# Patient Record
Sex: Male | Born: 1945
Health system: Southern US, Community
[De-identification: ages and names within clinical notes are randomized; demographics above are authoritative.]

## PROBLEM LIST (undated history)

## (undated) DIAGNOSIS — E669 Obesity, unspecified: Secondary | ICD-10-CM

## (undated) DIAGNOSIS — I2510 Atherosclerotic heart disease of native coronary artery without angina pectoris: Secondary | ICD-10-CM

## (undated) DIAGNOSIS — K219 Gastro-esophageal reflux disease without esophagitis: Secondary | ICD-10-CM

## (undated) DIAGNOSIS — B354 Tinea corporis: Secondary | ICD-10-CM

## (undated) DIAGNOSIS — N200 Calculus of kidney: Secondary | ICD-10-CM

## (undated) DIAGNOSIS — Z9289 Personal history of other medical treatment: Secondary | ICD-10-CM

## (undated) DIAGNOSIS — M1990 Unspecified osteoarthritis, unspecified site: Secondary | ICD-10-CM

## (undated) DIAGNOSIS — I10 Essential (primary) hypertension: Secondary | ICD-10-CM

## (undated) HISTORY — DX: Personal history of other medical treatment: Z92.89

## (undated) HISTORY — PX: CATARACT EXTRACTION: SUR2

## (undated) HISTORY — PX: TOTAL KNEE ARTHROPLASTY: SHX125

## (undated) HISTORY — DX: Tinea corporis: B35.4

## (undated) HISTORY — DX: Obesity, unspecified: E66.9

## (undated) HISTORY — PX: TONSILLECTOMY: SUR1361

---

## 2008-01-12 ENCOUNTER — Encounter: Payer: Self-pay | Admitting: Internal Medicine

## 2009-06-28 ENCOUNTER — Encounter (INDEPENDENT_AMBULATORY_CARE_PROVIDER_SITE_OTHER): Payer: Self-pay | Admitting: *Deleted

## 2009-07-05 ENCOUNTER — Ambulatory Visit: Payer: Self-pay | Admitting: Internal Medicine

## 2009-07-05 DIAGNOSIS — N400 Enlarged prostate without lower urinary tract symptoms: Secondary | ICD-10-CM

## 2009-07-05 DIAGNOSIS — G4700 Insomnia, unspecified: Secondary | ICD-10-CM

## 2009-07-05 LAB — CONVERTED CEMR LAB
Bacteria, UA: NONE SEEN
Bilirubin Urine: NEGATIVE
Blood in Urine, dipstick: NEGATIVE
Casts: NONE SEEN /lpf
Crystals: NONE SEEN
Ketones, urine, test strip: NEGATIVE
Protein, U semiquant: NEGATIVE
Specific Gravity, Urine: <1.005
Urobilinogen, UA: 0.2
WBC, UA: NONE SEEN cells/hpf (ref <3)

## 2009-07-06 ENCOUNTER — Encounter: Payer: Self-pay | Admitting: Internal Medicine

## 2009-07-10 LAB — CONVERTED CEMR LAB
BUN: 11 mg/dL (ref 6–23)
Basophils Absolute: 0.1 10*3/uL (ref 0.0–0.1)
Cholesterol: 198 mg/dL (ref 0–200)
Creatinine, Ser: 0.8 mg/dL (ref 0.4–1.5)
GFR calc non Af Amer: 103.27 mL/min (ref >60)
Glucose, Bld: 83 mg/dL (ref 70–99)
HCT: 46.3 % (ref 39.0–52.0)
Lymphs Abs: 2.3 10*3/uL (ref 0.7–4.0)
MCV: 96.2 fL (ref 78.0–100.0)
Monocytes Absolute: 0.7 10*3/uL (ref 0.1–1.0)
Monocytes Relative: 8.8 % (ref 3.0–12.0)
Platelets: 235 10*3/uL (ref 150.0–400.0)
Potassium: 3.9 meq/L (ref 3.5–5.1)
RDW: 15.2 % — ABNORMAL HIGH (ref 11.5–14.6)
TSH: 1.12 microintl units/mL (ref 0.35–5.50)

## 2009-09-22 ENCOUNTER — Ambulatory Visit: Payer: Self-pay | Admitting: Internal Medicine

## 2009-09-22 DIAGNOSIS — M25519 Pain in unspecified shoulder: Secondary | ICD-10-CM | POA: Insufficient documentation

## 2009-09-22 DIAGNOSIS — L719 Rosacea, unspecified: Secondary | ICD-10-CM

## 2009-10-10 ENCOUNTER — Telehealth: Payer: Self-pay | Admitting: Internal Medicine

## 2009-10-10 ENCOUNTER — Encounter: Payer: Self-pay | Admitting: Internal Medicine

## 2009-10-13 ENCOUNTER — Encounter: Payer: Self-pay | Admitting: Internal Medicine

## 2010-04-24 NOTE — Assessment & Plan Note (Signed)
Summary: irregular spot on nose/cbs   Vital Signs:  Patient profile:   65 year old male Weight:      235.13 pounds Pulse rate:   82 / minute Pulse rhythm:   regular BP sitting:   130 / 78  (left arm) Cuff size:   large  Vitals Entered By: Army Fossa CMA (September 22, 2009 1:08 PM) CC: Pt here c/o irregular skin on his nose x 2-3 months, Shoulder pain x 6 months.    History of Present Illness: for 2 months, his nose looks different, has a rash  For 6 months, he's having right shoulder pain, worse when he tries to exercise or go to the gym No radiation although he has noted some pain at the upper thoracic spine. Symptoms started after he moved to another  house, he did a lot of lifting symptoms often times are worse at night  Review of systems No discharge from the rash at  the nose No runny nose with food intake No increase size of his nose lately no right upper extremity paresthesias   Allergies (verified): No Known Drug Allergies  Past History:  Past Medical History: Reviewed history from 07/05/2009 and no changes required. Hx of ETOH abuse ( abstinent since 2008 apros , after TKR . apparently drinking d/t pain) h/o obesity, better since TKR h/o  HTN, resolved after he d/c ETOH OA , s/p knee replacement  mild allergies  UTI as a achild  Cscope  x 1 , had Polyps (aprox 2008) h/o Kidney stones  Past Surgical History: Reviewed history from 07/05/2009 and no changes required. Tonsillectomy-1950 Total knee replacement ***B*** 2008  Social History: Reviewed history from 07/05/2009 and no changes required. Married tobacco--no  Recovered alcoholic, abstinent since aprox 2008 Drug use-no Regular exercise-yes used to live in [place] years  moved from [place] 1-11 diet-- healthy exercise-- very active   Review of Systems      See HPI  Physical Exam  General:  alert and well-developed.   Msk:  shoulders are symmetric no tender to palpation at  the a.c. joint on either side Range of motion is slightly limited but symmetric as well. With extremes range of motion, he has pain in the right shoulder.  Neurologic:  upper extremity motor exam normal Skin:  nose skin  is rough, slightly oily, some atelectasis . I see no particular mole   Impression & Recommendations:  Problem # 1:  ROSACEA (ICD-695.3)  suspect he has rosacea Patient  is convinced he has  BCC and likes a referral to a specialist-----> will arrenge     Orders: Dermatology Referral (Derma)  Problem # 2:  SHOULDER PAIN (ICD-719.41)  suspect a rotator cuff tear Declines to take   medicines ( he has some Norco leftover from his knee surgery but doesn't like to " keep taking" medicine) declines physical therapy  plan: ortho referal  Orders: Orthopedic Referral (Ortho)

## 2010-04-24 NOTE — Letter (Signed)
Summary: New Patient Letter  [facility]  [address] [place], [place] [address]   Phone: [phone]  Fax: [phone]       06/28/2009 MRN: [id number]  Erik [name] [address] [place], [place]  [address]  Dear Mr. Erik [name] Angelica,   Welcome to [facility] and thank you for choosing [place] as your Primary Care Providers. Enclosed you will find information about our practice that we hope you find helpful. We have also enclosed forms to be filled out prior to your visit. This will provide [place] with the necessary information and facilitate your being seen in a timely manner. If you have any questions, please call us at:  [phone]        and we will be happy to assist you. We look forward to seeing you at your scheduled appointment time.  Appointment   [place] Antu 37,[phone]            with Dr.    Drue Novel             Sincerely,  Primary Health Care Team  Please arrive 15 minutes early for your first appointment and bring your insurance card. Co-pay is required at the time of your visit.  *****Please call the office if you are not able to keep this appointment. There is a charge of $50.00 if any appointment is not cancelled or rescheduled within 24 hours.

## 2010-04-24 NOTE — Assessment & Plan Note (Signed)
Summary: NEW PT/CPX/NS/KDC   Vital Signs:  Patient profile:   65 year old male Height:      70.5 inches Weight:      242 pounds BMI:     34.36 Pulse rate:   60 / minute BP sitting:   142 / 80  Vitals Entered By: R.Peeler CC: new pt./est./fasting Comments discuss urinary incontinence +rash on lower legs +pain in rt. shoulder-old injury   History of Present Illness: CPX NEW PT   one-year history of a rash, on and off, may last a few weeks, usually from the knee down, patches of slightly great and itchy skin.  Not dry  also urinary frequency and urgency for a while.  He recognizes that he drinks a lot of water. 3 weeks ago however, he developed suprapubic discomfort that radiated to the penis and along with that he had one or two episodes of very dark urine.  No frank blood that he could tell. Same symptoms repeated two weeks ago. no risk factors for  STDs he does have a history of urolithiasis at the time of his symptoms he did not have fever, nausea, vomiting, back pain. Is not experiencing difficulty urinating or slow flow  Preventive Screening-Counseling & Management  Caffeine-Diet-Exercise     Caffeine use/day: daily     Does Patient Exercise: yes     Times/week: 3      Drug Use:  no.    Past History:  Past Medical History: Hx of ETOH abuse ( abstinent since 2008 apros , after TKR . apparently drinking d/t pain) h/o obesity, better since TKR h/o  HTN, resolved after he d/c ETOH OA , s/p knee replacement  mild allergies  UTI as a achild  Cscope  x 1 , had Polyps (aprox 2008) h/o Kidney stones  Past Surgical History: Tonsillectomy-1950 Total knee replacement ***B*** 2008   Family History: +  Alcoholism/Addiction-Father + Arthritis Hypertension-- M pacemaker-- M DM--no MI--no prostate cancer-- dx age 52 colon ca--no   Social History: Married tobacco--no  Recovered alcoholic, abstinent since aprox 2008 Drug use-no Regular exercise-yes used to  live in [place] x 10 years  moved from [place] 1-11 diet-- healthy exercise-- very active Drug Use:  no Does Patient Exercise:  yes Caffeine use/day:  daily  Review of Systems CV:  Denies chest pain or discomfort and swelling of feet. Resp:  Denies cough and shortness of breath. GI:  Denies bloody stools, nausea, and vomiting. Psych:  Denies anxiety and depression.  Physical Exam  General:  alert, well-developed, and overweight-appearing.   Neck:  no masses, no thyromegaly, and normal carotid upstroke.   Lungs:  normal respiratory effort, no intercostal retractions, no accessory muscle use, and normal breath sounds.   Heart:  normal rate, regular rhythm, no murmur, and no gallop.   Abdomen:  soft, non-tender, no distention, no masses, no guarding, and no rigidity.   Rectal:  No external abnormalities noted. Normal sphincter tone. No rectal masses or tenderness. Prostate:  Prostate gland firm and smooth, no enlargement, nodularity, tenderness, mass, asymmetry or induration. Extremities:  no edema Neurologic:  speech, gait, motor strength symmetric and normal Psych:  Oriented X3, memory intact for recent and remote, normally interactive, good eye contact, not anxious appearing, and not depressed appearing.     Impression & Recommendations:  Problem # 1:  ROUTINE GENERAL MEDICAL EXAM@HEALTH  CARE FACL (ICD-V70.0) recently moved from [place], doing well except for the rash and BPH symptoms he reportedly had an unhealthy  lifestyle, drunk alcohol, was on medication for BP up until 2008.  He got a lot better after his TKRs and he was able to be more active.  Currently feeling very well reports a Cscope  x 1 , had Polyps (aprox 2008) , again will obtain records labs, likes vit D checked also continue healthy lifestyle  Orders: Venipuncture (62130) TLB-ALT (SGPT) (84460-ALT) TLB-AST (SGOT) (84450-SGOT) TLB-BMP (Basic Metabolic Panel-BMET) (80048-METABOL) TLB-CBC Platelet -  w/Differential (85025-CBCD) TLB-Lipid Panel (80061-LIPID) TLB-PSA (Prostate Specific Antigen) (84153-PSA) TLB-TSH (Thyroid Stimulating Hormone) (84443-TSH) UA Dipstick w/o Micro (manual) (86578) Specimen Handling (99000) T-Culture, Urine (46962-95284) T-Urine Microscopic (13244-01027) T-Vitamin D (25-Hydroxy) (25366-44034)  Problem # 2:  HYPERTROPHY PROSTATE W/O UR OBST & OTH LUTS (ICD-600.00) symptoms consistent with BPH however prostate  exam is normal episodic dark urine, hematuria? Plan: UA, urine culture PSA Will recommend the patient to come back to the office for a urine sample whenever he is urine turns dark consider urology referral trial of Flomax for frequency/urgency symptoms  Problem # 3:  rash exam: patch of slt dry-red skin in the LE recommend over-the-counter hydrocortisone cream. Eczema?  Problem # 4:  INSOMNIA-SLEEP DISORDER-UNSPEC (ICD-780.52) on klonopin as needed apparently had also issues w/  "moving his legs" at night will call for RF, will need 90 days   Complete Medication List: 1)  Klonopin 0.5 Mg Tabs (Clonazepam) .... One tablet by mouth daily 2)  Mvi  .... Daily 3)  Flomax 0.4 Mg Caps (Tamsulosin hcl) .... One by mouth daily  Patient Instructions: 1)  hydrocortisone cream OTC to legs as needed  2)  Please schedule a follow-up appointment in 6 months .  Prescriptions: FLOMAX 0.4 MG CAPS (TAMSULOSIN HCL) one by mouth daily  #30 x 6   Entered and Authorized by:   Elita Quick E. Paz MD   Signed by:   Nolon Rod. Paz MD on 07/05/2009   Method used:   Print then Give to Patient   RxID:   [id number]    Preventive Care Screening  Colonoscopy:    Date:  03/25/2006    Results:  normal    Laboratory Results   Urine Tests    Routine Urinalysis   Glucose: negative   (Normal Range: Negative) Bilirubin: negative   (Normal Range: Negative) Ketone: negative   (Normal Range: Negative) Spec. Gravity: <1.005   (Normal Range: 1.003-1.035) Blood:  negative   (Normal Range: Negative) pH: 6.0   (Normal Range: 5.0-8.0) Protein: negative   (Normal Range: Negative) Urobilinogen: 0.2   (Normal Range: 0-1) Nitrite: negative   (Normal Range: Negative) Leukocyte Esterace: negative   (Normal Range: Negative)

## 2010-04-24 NOTE — Consult Note (Signed)
Summary: [facility]  [facility]   Imported By: Lanelle Bal 11/01/2009 09:36:06  _____________________________________________________________________  External Attachment:    Type:   Image     Comment:   External Document

## 2010-04-24 NOTE — Progress Notes (Signed)
Summary: old paper records   Phone Note Outgoing Call   Summary of Call: old paper records reviewed -We'll scan his old EKGs -We'll also scan an abdominal ultrasound from 01-12-08: fatty liver, right kidney cyst, bilateral renal stones -the papers will be mailed to the patient for safe keeping Yee Gangi E. Kaenan Jake MD  October 10, 2009 9:29 AM

## 2010-12-09 ENCOUNTER — Emergency Department (HOSPITAL_COMMUNITY)
Admission: EM | Admit: 2010-12-09 | Discharge: 2010-12-09 | Disposition: A | Discharge status: Home / Self Care | Payer: Medicare Other | Attending: Emergency Medicine | Admitting: Emergency Medicine

## 2010-12-09 ENCOUNTER — Emergency Department (HOSPITAL_COMMUNITY): Payer: Medicare Other

## 2010-12-09 DIAGNOSIS — R51 Headache: Secondary | ICD-10-CM | POA: Insufficient documentation

## 2010-12-09 DIAGNOSIS — R42 Dizziness and giddiness: Secondary | ICD-10-CM | POA: Insufficient documentation

## 2010-12-09 DIAGNOSIS — R0602 Shortness of breath: Secondary | ICD-10-CM | POA: Insufficient documentation

## 2010-12-09 DIAGNOSIS — R000 Tachycardia, unspecified: Secondary | ICD-10-CM | POA: Insufficient documentation

## 2010-12-09 DIAGNOSIS — R079 Chest pain, unspecified: Secondary | ICD-10-CM | POA: Insufficient documentation

## 2010-12-09 DIAGNOSIS — I10 Essential (primary) hypertension: Secondary | ICD-10-CM | POA: Insufficient documentation

## 2010-12-09 LAB — DIFFERENTIAL
Basophils Absolute: 0.1 10*3/uL (ref 0.0–0.1)
Basophils Relative: 1 % (ref 0–1)
Neutro Abs: 7.7 10*3/uL (ref 1.7–7.7)
Neutrophils Relative %: 65 % (ref 43–77)

## 2010-12-09 LAB — CBC
MCV: 95 fL (ref 78.0–100.0)
Platelets: 239 10*3/uL (ref 150–400)
RBC: 4.64 MIL/uL (ref 4.22–5.81)
RDW: 13.6 % (ref 11.5–15.5)
WBC: 11.9 10*3/uL — ABNORMAL HIGH (ref 4.0–10.5)

## 2010-12-09 LAB — PRO B NATRIURETIC PEPTIDE: Pro B Natriuretic peptide (BNP): 145.2 pg/mL — ABNORMAL HIGH (ref 0–125)

## 2010-12-09 LAB — POCT I-STAT TROPONIN I: Troponin i, poc: 0.03 ng/mL (ref 0.00–0.08)

## 2010-12-09 LAB — BASIC METABOLIC PANEL
Chloride: 101 mEq/L (ref 96–112)
GFR calc Af Amer: >60 mL/min (ref >60)
GFR calc non Af Amer: >60 mL/min (ref >60)
Potassium: 4.2 mEq/L (ref 3.5–5.1)
Sodium: 138 mEq/L (ref 135–145)

## 2011-04-03 DIAGNOSIS — J111 Influenza due to unidentified influenza virus with other respiratory manifestations: Secondary | ICD-10-CM | POA: Diagnosis not present

## 2011-04-14 ENCOUNTER — Emergency Department (HOSPITAL_COMMUNITY)
Admission: EM | Admit: 2011-04-14 | Discharge: 2011-04-14 | Disposition: A | Discharge status: Home / Self Care | Payer: Medicare Other | Attending: Emergency Medicine | Admitting: Emergency Medicine

## 2011-04-14 ENCOUNTER — Emergency Department (HOSPITAL_COMMUNITY): Payer: Medicare Other

## 2011-04-14 ENCOUNTER — Encounter (HOSPITAL_COMMUNITY): Payer: Self-pay | Admitting: Emergency Medicine

## 2011-04-14 DIAGNOSIS — R109 Unspecified abdominal pain: Secondary | ICD-10-CM | POA: Insufficient documentation

## 2011-04-14 DIAGNOSIS — N201 Calculus of ureter: Secondary | ICD-10-CM | POA: Insufficient documentation

## 2011-04-14 DIAGNOSIS — N200 Calculus of kidney: Secondary | ICD-10-CM | POA: Diagnosis not present

## 2011-04-14 DIAGNOSIS — I10 Essential (primary) hypertension: Secondary | ICD-10-CM | POA: Diagnosis not present

## 2011-04-14 DIAGNOSIS — Z79899 Other long term (current) drug therapy: Secondary | ICD-10-CM | POA: Insufficient documentation

## 2011-04-14 HISTORY — DX: Calculus of kidney: N20.0

## 2011-04-14 HISTORY — DX: Essential (primary) hypertension: I10

## 2011-04-14 LAB — URINALYSIS, ROUTINE W REFLEX MICROSCOPIC
Bilirubin Urine: NEGATIVE
Nitrite: NEGATIVE
Specific Gravity, Urine: 1.027 (ref 1.005–1.030)
Urobilinogen, UA: 0.2 mg/dL (ref 0.0–1.0)
pH: 5.5 (ref 5.0–8.0)

## 2011-04-14 LAB — BASIC METABOLIC PANEL
BUN: 11 mg/dL (ref 6–23)
Chloride: 105 mEq/L (ref 96–112)
GFR calc Af Amer: 89 mL/min — ABNORMAL LOW (ref >90)
GFR calc non Af Amer: 76 mL/min — ABNORMAL LOW (ref >90)
Potassium: 3.8 mEq/L (ref 3.5–5.1)
Sodium: 140 mEq/L (ref 135–145)

## 2011-04-14 LAB — URINE MICROSCOPIC-ADD ON

## 2011-04-14 MED ORDER — ONDANSETRON HCL 4 MG/2ML IJ SOLN
4.0000 mg | Freq: Once | INTRAMUSCULAR | Status: AC
  Administered 2011-04-14: 4 mg via INTRAVENOUS
  Filled 2011-04-14: qty 2

## 2011-04-14 MED ORDER — MORPHINE SULFATE 4 MG/ML IJ SOLN
6.0000 mg | Freq: Once | INTRAMUSCULAR | Status: AC
  Administered 2011-04-14: 6 mg via INTRAVENOUS
  Filled 2011-04-14: qty 2

## 2011-04-14 MED ORDER — KETOROLAC TROMETHAMINE 30 MG/ML IJ SOLN
30.0000 mg | Freq: Once | INTRAMUSCULAR | Status: AC
  Administered 2011-04-14: 30 mg via INTRAVENOUS
  Filled 2011-04-14: qty 1

## 2011-04-14 MED ORDER — TAMSULOSIN HCL 0.4 MG PO CAPS: 0.4000 mg | ORAL_CAPSULE | Freq: Every day | ORAL | Status: DC

## 2011-04-14 MED ORDER — OXYCODONE-ACETAMINOPHEN 5-325 MG PO TABS: 1.0000 | ORAL_TABLET | Freq: Four times a day (QID) | ORAL | Status: AC | PRN

## 2011-04-14 MED ORDER — OXYCODONE-ACETAMINOPHEN 5-325 MG PO TABS
2.0000 | ORAL_TABLET | Freq: Once | ORAL | Status: AC
  Administered 2011-04-14: 2 via ORAL
  Filled 2011-04-14: qty 2

## 2011-04-14 MED ORDER — HYDROMORPHONE HCL PF 1 MG/ML IJ SOLN
1.0000 mg | Freq: Once | INTRAMUSCULAR | Status: AC
  Administered 2011-04-14: 1 mg via INTRAVENOUS
  Filled 2011-04-14: qty 1

## 2011-04-14 MED ORDER — NAPROXEN 500 MG PO TABS: 500.0000 mg | ORAL_TABLET | Freq: Two times a day (BID) | ORAL | Status: AC

## 2011-04-14 NOTE — ED Provider Notes (Signed)
History     CSN: [id number]  Arrival date & time 04/14/11  1324   First MD Initiated Contact with Patient 04/14/11 1403      Chief Complaint  Patient presents with  . Abdominal Pain    (Consider location/radiation/quality/duration/timing/severity/associated sxs/prior treatment) HPI Comments: Patient presents with sudden onset of pain to his right flank area that started 2 hours ago. He has a history of one kidney stone in the past approximately 10 years ago. This feels similar to that he did try to take a Norco tablet at home prior to arrival but this did not relieve his symptoms. He has some pressure on urination but denies any other urinary difficulties. He's had some nausea but no vomiting no fevers no change in bowel habits. Denies any fever cough congestion or other recent illnesses.  Patient is a 66 y.o. male presenting with abdominal pain. The history is provided by the patient.  Abdominal Pain The primary symptoms of the illness include abdominal pain and nausea. The primary symptoms of the illness do not include fever, fatigue, shortness of breath, vomiting or diarrhea.  Additional symptoms associated with the illness include urgency. Symptoms associated with the illness do not include chills, diaphoresis, hematuria, frequency or back pain.    Past Medical History  Diagnosis Date  . Kidney stone   . Hypertension     Past Surgical History  Procedure Date  . Knee arthroplasty     History reviewed. No pertinent family history.  History  Substance Use Topics  . Smoking status: Not on file  . Smokeless tobacco: Not on file  . Alcohol Use:       Review of Systems  Constitutional: Negative for fever, chills, diaphoresis and fatigue.  HENT: Negative for congestion, rhinorrhea and sneezing.   Eyes: Negative.   Respiratory: Negative for cough, chest tightness and shortness of breath.   Cardiovascular: Negative for chest pain and leg swelling.  Gastrointestinal:  Positive for nausea and abdominal pain. Negative for vomiting, diarrhea and blood in stool.  Genitourinary: Positive for urgency. Negative for frequency, hematuria, flank pain, difficulty urinating and testicular pain.  Musculoskeletal: Negative for back pain and arthralgias.  Skin: Negative for rash.  Neurological: Negative for dizziness, speech difficulty, weakness, numbness and headaches.    Allergies  Review of patient's allergies indicates no known allergies.  Home Medications   Current Outpatient Rx  Name Route Sig Dispense Refill  . BENZONATATE 200 MG PO CAPS Oral Take 200 mg by mouth 3 (three) times daily as needed. Cough    . CALCIUM CARBONATE-VITAMIN D 500-200 MG-UNIT PO TABS Oral Take 1 tablet by mouth daily.    Marland Kitchen HYDROCODONE-ACETAMINOPHEN 10-325 MG PO TABS Oral Take 1 tablet by mouth every 6 (six) hours as needed. Pain    . ADULT MULTIVITAMIN W/MINERALS CH Oral Take 1 tablet by mouth daily.    . OSELTAMIVIR PHOSPHATE 75 MG PO CAPS Oral Take 75 mg by mouth daily. Completed last sunday    . RAMIPRIL 10 MG PO TABS Oral Take 10 mg by mouth daily.    . SAW PALMETTO (SERENOA REPENS) 500 MG PO CAPS Oral Take 500 mg by mouth daily.      BP 143/87  Pulse 83  Temp(Src) 97.3 F (36.3 C) (Oral)  Resp 16  SpO2 100%  Physical Exam  Constitutional: He is oriented to person, place, and time. He appears well-developed and well-nourished.  HENT:  Head: Normocephalic and atraumatic.  Eyes: Pupils are equal, round, and  reactive to light.  Neck: Normal range of motion. Neck supple.  Cardiovascular: Normal rate, regular rhythm and normal heart sounds.   Pulmonary/Chest: Effort normal and breath sounds normal. No respiratory distress. He has no wheezes. He has no rales. He exhibits no tenderness.  Abdominal: Soft. Bowel sounds are normal. There is tenderness. There is no rebound and no guarding.       Mild tenderness in the right mid abdomen. No rebound or guarding.  No CVA tenderness    Musculoskeletal: Normal range of motion. He exhibits no edema.  Lymphadenopathy:    He has no cervical adenopathy.  Neurological: He is alert and oriented to person, place, and time.  Skin: Skin is warm and dry. No rash noted.  Psychiatric: He has a normal mood and affect.    ED Course  Procedures (including critical care time)  Results for orders placed during the hospital encounter of 04/14/11  URINALYSIS, ROUTINE W REFLEX MICROSCOPIC      Component Value Range   Color, Urine AMBER (*) YELLOW    APPearance CLOUDY (*) CLEAR    Specific Gravity, Urine 1.027  1.005 - 1.030    pH 5.5  5.0 - 8.0    Glucose, UA NEGATIVE  NEGATIVE (mg/dL)   Hgb urine dipstick LARGE (*) NEGATIVE    Bilirubin Urine NEGATIVE  NEGATIVE    Ketones, ur TRACE (*) NEGATIVE (mg/dL)   Protein, ur NEGATIVE  NEGATIVE (mg/dL)   Urobilinogen, UA 0.2  0.0 - 1.0 (mg/dL)   Nitrite NEGATIVE  NEGATIVE    Leukocytes, UA SMALL (*) NEGATIVE   BASIC METABOLIC PANEL      Component Value Range   Sodium 140  135 - 145 (mEq/L)   Potassium 3.8  3.5 - 5.1 (mEq/L)   Chloride 105  96 - 112 (mEq/L)   CO2 25  19 - 32 (mEq/L)   Glucose, Bld 105 (*) 70 - 99 (mg/dL)   BUN 11  6 - 23 (mg/dL)   Creatinine, Ser 1.61  0.50 - 1.35 (mg/dL)   Calcium 9.4  8.4 - 09.6 (mg/dL)   GFR calc non Af Amer 76 (*) >90 (mL/min)   GFR calc Af Amer 89 (*) >90 (mL/min)  URINE MICROSCOPIC-ADD ON      Component Value Range   Squamous Epithelial / LPF RARE  RARE    WBC, UA 3-6  <3 (WBC/hpf)   RBC / HPF TOO NUMEROUS TO COUNT  <3 (RBC/hpf)   Bacteria, UA MANY (*) RARE    Urine-Other MUCOUS PRESENT     Ct Abdomen Pelvis Wo Contrast  04/14/2011  *RADIOLOGY REPORT*  Clinical Data: Right abdominal pain, history of kidney stone.  CT ABDOMEN AND PELVIS WITHOUT CONTRAST  Technique:  Multidetector CT imaging of the abdomen and pelvis was performed following the standard protocol without intravenous contrast.  Comparison: None.  Findings: Mild linear scarring  in the lingula and bilateral lower lobes.  Moderate atelectasis.  Unenhanced liver, spleen, pancreas, and adrenal glands are within normal limits.  Gallbladder is unremarkable.  No intrahepatic or extrahepatic ductal dilatation.  3.3 x 5.3 cm right renal cyst.  At least four left and three right nonobstructing renal calculi, measuring up to 3 mm. No hydronephrosis.  No evidence of bowel obstruction.  Normal appendix.  Colonic diverticulosis, without associated inflammatory changes.  Atherosclerotic calcifications of the abdominal aorta and branch vessels.  No abdominopelvic ascites.  No suspicious abdominopelvic lymphadenopathy.  Prostate is unremarkable.  4 mm distal right ureteral calculus (  series 2/image 85).  Bladder is underdistended.  Mild degenerative changes of the visualized thoracolumbar spine.  IMPRESSION: 4 mm distal right ureteral calculus.  No hydronephrosis.  Multiple additional bilateral nonobstructing calculi measuring up to 3 mm.  Original Report Authenticated By: Charline Bills, M.D.      1. Kidney stone       MDM  Pt with 4mm distal ureteral stone.  No hydro.  No infection.  If pain controlled, can go home and f/u with urology        Rolan Bucco, MD 04/14/11 [phone]

## 2011-04-14 NOTE — ED Notes (Signed)
Pt family member sts that she would like to talk to EDP. Pt c/o nausea, dry heaves, and pain.

## 2011-04-14 NOTE — ED Provider Notes (Signed)
Improved on reassessment after the dilaudid, Toradol. I administered a dose of Percocet to last him until he gets his prescription filled. Instructed to followup with urology within one week  Dayton Bailiff, MD 04/14/11 1656

## 2011-04-14 NOTE — ED Notes (Signed)
abd pain for the past 2 hours radates to back, took hydrocodone earlier with minimal relief, hx kidney stone

## 2011-04-26 DIAGNOSIS — E669 Obesity, unspecified: Secondary | ICD-10-CM | POA: Diagnosis not present

## 2011-04-26 DIAGNOSIS — I10 Essential (primary) hypertension: Secondary | ICD-10-CM | POA: Diagnosis not present

## 2011-04-26 DIAGNOSIS — R0982 Postnasal drip: Secondary | ICD-10-CM | POA: Diagnosis not present

## 2011-05-15 DIAGNOSIS — I10 Essential (primary) hypertension: Secondary | ICD-10-CM | POA: Diagnosis not present

## 2011-05-15 DIAGNOSIS — Z125 Encounter for screening for malignant neoplasm of prostate: Secondary | ICD-10-CM | POA: Diagnosis not present

## 2011-10-24 DIAGNOSIS — E669 Obesity, unspecified: Secondary | ICD-10-CM | POA: Diagnosis not present

## 2011-10-24 DIAGNOSIS — I10 Essential (primary) hypertension: Secondary | ICD-10-CM | POA: Diagnosis not present

## 2011-10-24 DIAGNOSIS — M25519 Pain in unspecified shoulder: Secondary | ICD-10-CM | POA: Diagnosis not present

## 2012-04-01 DIAGNOSIS — E669 Obesity, unspecified: Secondary | ICD-10-CM | POA: Diagnosis not present

## 2012-04-01 DIAGNOSIS — I10 Essential (primary) hypertension: Secondary | ICD-10-CM | POA: Diagnosis not present

## 2012-04-01 DIAGNOSIS — Z0000 Encounter for general adult medical examination without abnormal findings: Secondary | ICD-10-CM | POA: Diagnosis not present

## 2012-04-27 DIAGNOSIS — D1801 Hemangioma of skin and subcutaneous tissue: Secondary | ICD-10-CM | POA: Diagnosis not present

## 2012-04-27 DIAGNOSIS — L739 Follicular disorder, unspecified: Secondary | ICD-10-CM | POA: Diagnosis not present

## 2012-04-27 DIAGNOSIS — L570 Actinic keratosis: Secondary | ICD-10-CM | POA: Diagnosis not present

## 2012-04-27 DIAGNOSIS — L821 Other seborrheic keratosis: Secondary | ICD-10-CM | POA: Diagnosis not present

## 2012-04-27 DIAGNOSIS — R21 Rash and other nonspecific skin eruption: Secondary | ICD-10-CM | POA: Diagnosis not present

## 2012-04-27 DIAGNOSIS — D485 Neoplasm of uncertain behavior of skin: Secondary | ICD-10-CM | POA: Diagnosis not present

## 2012-04-27 DIAGNOSIS — L259 Unspecified contact dermatitis, unspecified cause: Secondary | ICD-10-CM | POA: Diagnosis not present

## 2012-04-27 DIAGNOSIS — L820 Inflamed seborrheic keratosis: Secondary | ICD-10-CM | POA: Diagnosis not present

## 2012-11-30 DIAGNOSIS — R195 Other fecal abnormalities: Secondary | ICD-10-CM | POA: Diagnosis not present

## 2012-11-30 DIAGNOSIS — R0609 Other forms of dyspnea: Secondary | ICD-10-CM | POA: Diagnosis not present

## 2012-11-30 DIAGNOSIS — R42 Dizziness and giddiness: Secondary | ICD-10-CM | POA: Diagnosis not present

## 2012-11-30 DIAGNOSIS — R5381 Other malaise: Secondary | ICD-10-CM | POA: Diagnosis not present

## 2012-12-14 DIAGNOSIS — I10 Essential (primary) hypertension: Secondary | ICD-10-CM | POA: Diagnosis not present

## 2012-12-14 DIAGNOSIS — R0609 Other forms of dyspnea: Secondary | ICD-10-CM | POA: Diagnosis not present

## 2012-12-14 DIAGNOSIS — E669 Obesity, unspecified: Secondary | ICD-10-CM | POA: Diagnosis not present

## 2012-12-14 DIAGNOSIS — Z13220 Encounter for screening for lipoid disorders: Secondary | ICD-10-CM | POA: Diagnosis not present

## 2012-12-31 ENCOUNTER — Ambulatory Visit (HOSPITAL_COMMUNITY): Payer: Medicare Other | Attending: Cardiovascular Disease | Admitting: Radiology

## 2012-12-31 ENCOUNTER — Other Ambulatory Visit (HOSPITAL_COMMUNITY): Payer: Self-pay | Admitting: Interventional Cardiology

## 2012-12-31 DIAGNOSIS — R0609 Other forms of dyspnea: Secondary | ICD-10-CM | POA: Insufficient documentation

## 2012-12-31 DIAGNOSIS — R0602 Shortness of breath: Secondary | ICD-10-CM

## 2012-12-31 DIAGNOSIS — R0989 Other specified symptoms and signs involving the circulatory and respiratory systems: Secondary | ICD-10-CM | POA: Insufficient documentation

## 2012-12-31 NOTE — Progress Notes (Signed)
Echocardiogram performed.  

## 2013-01-11 ENCOUNTER — Other Ambulatory Visit: Payer: Self-pay | Admitting: Interventional Cardiology

## 2013-02-01 ENCOUNTER — Ambulatory Visit: Payer: 59 | Admitting: Interventional Cardiology

## 2013-02-04 ENCOUNTER — Encounter: Payer: Self-pay | Admitting: *Deleted

## 2013-02-04 ENCOUNTER — Encounter: Payer: Self-pay | Admitting: Interventional Cardiology

## 2013-02-04 DIAGNOSIS — B354 Tinea corporis: Secondary | ICD-10-CM | POA: Insufficient documentation

## 2013-02-04 DIAGNOSIS — E669 Obesity, unspecified: Secondary | ICD-10-CM | POA: Insufficient documentation

## 2013-02-05 ENCOUNTER — Encounter: Payer: Self-pay | Admitting: Interventional Cardiology

## 2013-02-05 ENCOUNTER — Ambulatory Visit (INDEPENDENT_AMBULATORY_CARE_PROVIDER_SITE_OTHER): Payer: Medicare Other | Admitting: Interventional Cardiology

## 2013-02-05 ENCOUNTER — Encounter (INDEPENDENT_AMBULATORY_CARE_PROVIDER_SITE_OTHER): Payer: Self-pay

## 2013-02-05 VITALS — BP 100/76 | HR 80 | Ht 70.0 in | Wt 264.3 lb

## 2013-02-05 DIAGNOSIS — I209 Angina pectoris, unspecified: Secondary | ICD-10-CM

## 2013-02-05 MED ORDER — NITROGLYCERIN 0.4 MG SL SUBL: 0.4000 mg | SUBLINGUAL_TABLET | SUBLINGUAL | Status: DC | PRN

## 2013-02-05 NOTE — Progress Notes (Signed)
Patient ID: Erik [name], male   DOB: Sep 03, 1945, 67 y.o.   MRN: [id number]    [address] [place], [place]  [address] Phone: [phone] Fax:  [phone]  Date:  02/05/2013   ID:  Erik [name], DOB [birth year], MRN [id number]  PCP:  Elby Showers, MD      History of Present Illness: Erik [name] is a 67 y.o. male who has had some fatigue and SHOB for the past 2 months. He was walking up some stairs and was winded at the top of the stairs. He may have had some mild chest tightness. Even showering brings on fatigue. Walking in the house requires a rest afterwards. He had been mowing the lawn, but then had to take breaks in between. No illnesses a tthat time. He had some diarrhea at one point.   He had an ETT in 9/14 which caused angina.  He was started on Imdur, and all of his sx have resolved.  He feels normal.  His fatigue, SHOB, CP have all resolved with his activities as described above.  He still climbs stairs and since starting IUmdur, has no sx.  He plans starting exercise with a personal trainer.    Wt Readings from Last 3 Encounters:  02/05/13 264 lb 4.8 oz (119.886 kg)  09/22/09 235 lb 2.1 oz (106.655 kg)  07/05/09 242 lb (109.77 kg)     Past Medical History  Diagnosis Date  . Kidney stone   . Hypertension   . Tinea corporis   . Obesity     Current Outpatient Prescriptions  Medication Sig Dispense Refill  . calcium-vitamin D (OSCAL WITH D) 500-200 MG-UNIT per tablet Take 1 tablet by mouth daily.      Marland Kitchen HYDROcodone-acetaminophen (NORCO) 10-325 MG per tablet Take 1 tablet by mouth every 6 (six) hours as needed. Pain      . isosorbide mononitrate (IMDUR) 30 MG 24 hr tablet TAKE 1 TABLET BY MOUTH ONCE DAILY  30 tablet  6  . Multiple Vitamin (MULITIVITAMIN WITH MINERALS) TABS Take 1 tablet by mouth daily.      . ramipril (ALTACE) 10 MG tablet Take 10 mg by mouth daily.      . saw palmetto 500 MG capsule Take 500 mg by mouth daily.      . Tamsulosin HCl  (FLOMAX) 0.4 MG CAPS Take 1 capsule (0.4 mg total) by mouth daily.  10 capsule  0   No current facility-administered medications for this visit.    Allergies:   No Known Allergies  Social History:  The patient  reports that he has quit smoking. He does not have any smokeless tobacco history on file.   Family History:  The patient's family history includes Heart disease in his mother; Prostate cancer in his father.   ROS:  Please see the history of present illness.  No nausea, vomiting.  No fevers, chills.  No focal weakness.  No dysuria.   All other systems reviewed and negative.   PHYSICAL EXAM: VS:  BP 100/76  Pulse 80  Ht 5\' 10"  (1.778 m)  Wt 264 lb 4.8 oz (119.886 kg)  BMI 37.92 kg/m2 Well nourished, well developed, in no acute distress HEENT: normal Neck: no JVD, no carotid bruits Cardiac:  normal S1, S2; RRR;  Lungs:  clear to auscultation bilaterally, no wheezing, rhonchi or rales Abd: soft, nontender, no hepatomegaly Ext: no edema Skin: warm and dry Neuro:   no focal abnormalities noted  EKG:  Treadmill  test reviewed. : 5:37 on the treadmill.  No clear ischemia, but sx with exercise at that time.   ASSESSMENT AND PLAN:  Dyspnea on exertion  Continue Imdur Tablet Extended Release 24 Hour, 30 MG, 1 tablet, Orally, Once a day, 30 day(s), 30, Refills 11 LAB: BNP IMAGING: EKG    Overton,Shana 12/14/2012 01:06:42 PM > Heberto Sturdevant,JAY 12/14/2012 01:37:26 PM > NSR, PRWP, no ST segment changes, LAFB  IMAGING: EC Echocardiogram (Ordered for 12/14/2012) IMAGING: EC Stress Test [facility] (Ordered for 12/14/2012)    Abella Shugart,JAY 12/15/2012 05:58:20 PM > 5:47 on the treadmill. 5-6/10 chest discomfort with exercise which was relieved with rest. No ECG changes. Normal heart rate recovery. Imdur started. If symptoms persist, we'll plan for coronary angiogram. Patient informed. JARALLA SHAMLEFFER,IBTEHAL 12/16/2012 09:08:11 AM >    exertional chest discomfort/angina better on imdur. Start  sublingual nitroglycerin. he should contact us if he has any further symptoms.    2. Hypertension  Continue Ramipril Capsule, 10 MG, 1 capsule, Orally, Once a day  continue lifestyle modifications.    3. Obesity (BMI 30-39.9)  Notes: and weight loss. He feels that his diet is pretty healthy. He needs to increase exercise.spoke about the different ways to exercise including water, swimming, recumbent bike.    4. Lipid screening  Notes: LDL 118 at last check in 2013.            Labs    Lab: BNP  B-NATRIURETIC PEPTIDE 13  0-100 - pg/mL   Jeanie Mccard,JAY 12/15/2012 12:41:48 PM > normal, no fluid overload Stegall,Amy 12/15/2012 12:53:24 PM > Pt notified.         Preventive Medicine  Adult topics discussed:  Diet: weight loss if needed.      Signed, Fredric Mare, MD, [facility] 02/05/2013 10:48 AM

## 2013-02-05 NOTE — Patient Instructions (Addendum)
Your physician recommends that you schedule a follow-up appointment in: 4 months with Dr. Eldridge Dace.   Your physician has recommended you make the following change in your medication:   1. Use SL Nitro 0.4 mg as needed for CP. Rx sent to Pharmacy.

## 2013-02-14 ENCOUNTER — Other Ambulatory Visit: Payer: Self-pay | Admitting: Interventional Cardiology

## 2013-02-17 DIAGNOSIS — L219 Seborrheic dermatitis, unspecified: Secondary | ICD-10-CM | POA: Diagnosis not present

## 2013-02-17 DIAGNOSIS — L570 Actinic keratosis: Secondary | ICD-10-CM | POA: Diagnosis not present

## 2013-02-17 DIAGNOSIS — D1801 Hemangioma of skin and subcutaneous tissue: Secondary | ICD-10-CM | POA: Diagnosis not present

## 2013-02-17 DIAGNOSIS — L280 Lichen simplex chronicus: Secondary | ICD-10-CM | POA: Diagnosis not present

## 2013-02-17 DIAGNOSIS — L738 Other specified follicular disorders: Secondary | ICD-10-CM | POA: Diagnosis not present

## 2013-02-17 DIAGNOSIS — L821 Other seborrheic keratosis: Secondary | ICD-10-CM | POA: Diagnosis not present

## 2013-06-10 ENCOUNTER — Encounter: Payer: Self-pay | Admitting: Interventional Cardiology

## 2013-06-10 ENCOUNTER — Ambulatory Visit (INDEPENDENT_AMBULATORY_CARE_PROVIDER_SITE_OTHER): Payer: Medicare Other | Admitting: Interventional Cardiology

## 2013-06-10 VITALS — BP 138/96 | HR 66 | Ht 69.0 in | Wt 271.0 lb

## 2013-06-10 DIAGNOSIS — I10 Essential (primary) hypertension: Secondary | ICD-10-CM | POA: Diagnosis not present

## 2013-06-10 DIAGNOSIS — I209 Angina pectoris, unspecified: Secondary | ICD-10-CM

## 2013-06-10 DIAGNOSIS — E669 Obesity, unspecified: Secondary | ICD-10-CM

## 2013-06-10 DIAGNOSIS — R0602 Shortness of breath: Secondary | ICD-10-CM | POA: Diagnosis not present

## 2013-06-10 MED ORDER — ISOSORBIDE MONONITRATE ER 60 MG PO TB24: ORAL_TABLET | ORAL | Status: DC

## 2013-06-10 MED ORDER — RAMIPRIL 10 MG PO CAPS: 10.0000 mg | ORAL_CAPSULE | Freq: Every day | ORAL | Status: DC

## 2013-06-10 NOTE — Progress Notes (Signed)
Patient ID: Erik [name], male   DOB: [birth year], 68 y.o.   MRN: [id number] Patient ID: Erik [name], male   DOB: [birth year], 68 y.o.   MRN: [id number]    [place], [place] [place], [place]  [address] Phone: [phone] Fax:  [phone]  Date:  06/10/2013   ID:  Erik [name], DOB 1945-10-25, MRN [id number]  PCP:  Myrtis Ser, MD      History of Present Illness: Erik [name] is a 68 y.o. male who has had some fatigue and SHOB in 2014. He was walking up some stairs and was winded at the top of the stairs. He may have had some mild chest tightness. Even showering brings on fatigue. Walking in the house requires a rest afterwards. He had been mowing the lawn, but then had to take breaks in between. No illnesses a tthat time. He had some diarrhea at one point.   He had an ETT in 9/14 which caused angina.  He was started on Imdur, and all of his sx have resolved.  He feels normal.  His fatigue, SHOB, CP have all resolved with his activities as described above.  He still climbs stairs and since starting Imdur, has no sx.  He plans starting exercise with a personal trainer.   He exercises 3x/week.  He uses the bike and the treadmill.  He has noticed that when he makes a few trips up the stairs, he has some discomfort in his chest.      Wt Readings from Last 3 Encounters:  06/10/13 271 lb (122.925 kg)  02/05/13 264 lb 4.8 oz (119.886 kg)  09/22/09 235 lb 2.1 oz (106.655 kg)     Past Medical History  Diagnosis Date  . Kidney stone   . Hypertension   . Tinea corporis   . Obesity     Current Outpatient Prescriptions  Medication Sig Dispense Refill  . calcium-vitamin D (OSCAL WITH D) 500-200 MG-UNIT per tablet Take 1 tablet by mouth daily.      Marland Kitchen HYDROcodone-acetaminophen (NORCO) 10-325 MG per tablet Take 1 tablet by mouth every 6 (six) hours as needed. Pain      . isosorbide mononitrate (IMDUR) 30 MG 24 hr tablet TAKE 1 TABLET EVERY DAY  90 tablet  3  . Multiple Vitamin  (MULITIVITAMIN WITH MINERALS) TABS Take 1 tablet by mouth daily.      . nitroGLYCERIN (NITROSTAT) 0.4 MG SL tablet Place 1 tablet (0.4 mg total) under the tongue every 5 (five) minutes as needed for chest pain.  25 tablet  5  . ramipril (ALTACE) 10 MG tablet Take 10 mg by mouth daily.      . saw palmetto 500 MG capsule Take 500 mg by mouth daily.      . Tamsulosin HCl (FLOMAX) 0.4 MG CAPS Take 1 capsule (0.4 mg total) by mouth daily.  10 capsule  0   No current facility-administered medications for this visit.    Allergies:   No Known Allergies  Social History:  The patient  reports that he has quit smoking. He does not have any smokeless tobacco history on file.   Family History:  The patient's family history includes Heart disease in his mother; Prostate cancer in his father.   ROS:  Please see the history of present illness.  No nausea, vomiting.  No fevers, chills.  No focal weakness.  No dysuria.   All other systems reviewed and negative.   PHYSICAL EXAM: VS:  BP 138/96  Pulse 66  Ht 5\' 9"  (1.753 m)  Wt 271 lb (122.925 kg)  BMI 40.00 kg/m2 Well nourished, well developed, in no acute distress HEENT: normal Neck: no JVD, no carotid bruits Cardiac:  normal S1, S2; RRR;  Lungs:  clear to auscultation bilaterally, no wheezing, rhonchi or rales Abd: soft, nontender, no hepatomegaly Ext: no edema Skin: warm and dry Neuro:   no focal abnormalities noted  EKG:  Treadmill test reviewed. : 5:37 on the treadmill.  No clear ischemia, but sx with exercise at that time.   ASSESSMENT AND PLAN:  Dyspnea on exertion /angina Increase Imdur Tablet Extended Release 24 Hour, 60 MG, 1 tablet, Orally, Once a day, 30 day(s), 30, Refills 11    Erik [name],Erik [name] 12/14/2012 01:06:42 PM > Erik [name],Erik [name] 12/14/2012 01:37:26 PM > NSR, PRWP, no ST segment changes, LAFB     Erik [name],Erik [name] 12/15/2012 05:58:20 PM > 5:47 on the treadmill. 5-6/10 chest discomfort with exercise which was relieved with rest. No ECG  changes. Normal heart rate recovery. Imdur started. If symptoms persist, we'll plan for coronary angiogram.     exertional chest discomfort/angina better on imdur, not completely gone. Start sublingual nitroglycerin. he should contact us if he has any further symptoms.  Would plan on cath if sx pesist.  He is aware.    2. Hypertension  Refill Ramipril Capsule, 10 MG, 1 capsule, Orally, Once a day  continue lifestyle modifications.    3. Obesity (BMI 30-39.9)  Notes: and weight loss. He feels that his diet is pretty healthy. He needs to increase exercise.spoke about the different ways to exercise including water, swimming, recumbent bike.    4. Lipid screening  Notes: LDL 118 at last check in 2013.            Labs    Lab: BNP  B-NATRIURETIC PEPTIDE 13  0-100 - pg/mL   Erik [name],Erik [name] 12/15/2012 12:41:48 PM > normal, no fluid overload Stegall,Amy 12/15/2012 12:53:24 PM > Pt notified.         Preventive Medicine  Adult topics discussed:  Diet: weight loss if needed.      Signed, Mina Marble, MD, [facility] 06/10/2013 3:57 PM

## 2013-06-10 NOTE — Patient Instructions (Signed)
Your physician has recommended you make the following change in your medication:   1. Increase Imdur to 60 mg 1 tablet daily.  Refilled Ramipril 10 mg daily.  Your physician recommends that you schedule a follow-up appointment in: 3 months with Dr. Irish Lack.

## 2013-06-21 ENCOUNTER — Telehealth: Payer: Self-pay | Admitting: Interventional Cardiology

## 2013-06-21 ENCOUNTER — Other Ambulatory Visit: Payer: Self-pay | Admitting: Cardiology

## 2013-06-21 MED ORDER — RAMIPRIL 10 MG PO CAPS: 10.0000 mg | ORAL_CAPSULE | Freq: Every day | ORAL | Status: DC

## 2013-06-21 NOTE — Telephone Encounter (Signed)
Called pt back and he states he never received mail order from [facility] for rampiril. Pt will call and see if they are sending rx. Sent in 30 day supply to local [facility].

## 2013-06-21 NOTE — Telephone Encounter (Signed)
New Prob   Pt requesting a call from nurse. Pt did not want to leave details regarding message.

## 2013-07-26 ENCOUNTER — Encounter: Payer: Self-pay | Admitting: Interventional Cardiology

## 2013-09-03 ENCOUNTER — Ambulatory Visit: Payer: 59 | Admitting: Interventional Cardiology

## 2013-09-30 ENCOUNTER — Encounter: Payer: Self-pay | Admitting: Interventional Cardiology

## 2013-09-30 ENCOUNTER — Ambulatory Visit (INDEPENDENT_AMBULATORY_CARE_PROVIDER_SITE_OTHER): Payer: 59 | Admitting: Interventional Cardiology

## 2013-09-30 VITALS — BP 117/71 | HR 87 | Ht 69.0 in | Wt 272.2 lb

## 2013-09-30 DIAGNOSIS — I10 Essential (primary) hypertension: Secondary | ICD-10-CM | POA: Diagnosis not present

## 2013-09-30 DIAGNOSIS — I209 Angina pectoris, unspecified: Secondary | ICD-10-CM | POA: Diagnosis not present

## 2013-09-30 DIAGNOSIS — E669 Obesity, unspecified: Secondary | ICD-10-CM

## 2013-09-30 NOTE — Patient Instructions (Signed)
Your physician recommends that you continue on your current medications as directed. Please refer to the Current Medication list given to you today.  Your physician wants you to follow-up in: 6 months with Dr. Varanasi.  You will receive a reminder letter in the mail two months in advance. If you don't receive a letter, please call our office to schedule the follow-up appointment.  

## 2013-09-30 NOTE — Progress Notes (Signed)
Patient ID: Erik [name], male   DOB: 09-Oct-1945, 68 y.o.   MRN: [id number] Patient ID: Erik [name], male   DOB: 1945-04-22, 68 y.o.   MRN: [id number]     [place], [address], [place]  [address] Phone: [phone] Fax:  [phone]  Date:  09/30/2013   ID:  Erik [name], DOB 29-Oct-1945, MRN [id number]  PCP:  No primary provider on file.      History of Present Illness: Erik [name] is a 68 y.o. male who has had some fatigue and SHOB in 2014. He was walking up some stairs and was winded at the top of the stairs. He may have had some mild chest tightness. Even showering brings on fatigue. Walking in the house requires a rest afterwards. He had been mowing the lawn, but then had to take breaks in between. No illnesses a tthat time. He had some diarrhea at one point.   He had an ETT in 9/14 which caused angina.  He was started on Imdur, and all of his sx have resolved.  He feels normal.  His fatigue, SHOB, CP have all resolved with his activities as described above.  He still climbs stairs and since starting Imdur, has no sx.  He plans starting exercise with a personal trainer.   He exercises 3x/week.  He uses the bike and the treadmill.  He feels that his sx of discomfort in his chest are essentially gone on the meds.      Wt Readings from Last 3 Encounters:  09/30/13 272 lb 3.2 oz (123.469 kg)  06/10/13 271 lb (122.925 kg)  02/05/13 264 lb 4.8 oz (119.886 kg)     Past Medical History  Diagnosis Date  . Kidney stone   . Hypertension   . Tinea corporis   . Obesity     Current Outpatient Prescriptions  Medication Sig Dispense Refill  . calcium-vitamin D (OSCAL WITH D) 500-200 MG-UNIT per tablet Take 1 tablet by mouth daily.      . clonazePAM (KLONOPIN) 1 MG tablet       . isosorbide mononitrate (IMDUR) 60 MG 24 hr tablet 1 tablet by mouth daily  90 tablet  3  . Multiple Vitamin (MULITIVITAMIN WITH MINERALS) TABS Take 1 tablet by mouth daily.      . nitroGLYCERIN  (NITROSTAT) 0.4 MG SL tablet Place 1 tablet (0.4 mg total) under the tongue every 5 (five) minutes as needed for chest pain.  25 tablet  5  . ramipril (ALTACE) 10 MG capsule Take 1 capsule (10 mg total) by mouth daily.  30 capsule  3  . saw palmetto 500 MG capsule Take 500 mg by mouth daily.       No current facility-administered medications for this visit.    Allergies:   No Known Allergies  Social History:  The patient  reports that he has quit smoking. He does not have any smokeless tobacco history on file.   Family History:  The patient's family history includes Heart disease in his mother; Prostate cancer in his father.   ROS:  Please see the history of present illness.  No nausea, vomiting.  No fevers, chills.  No focal weakness.  No dysuria.   All other systems reviewed and negative.   PHYSICAL EXAM: VS:  BP 117/71  Pulse 87  Ht 5\' 9"  (1.753 m)  Wt 272 lb 3.2 oz (123.469 kg)  BMI 40.18 kg/m2 Well nourished, well developed, in no acute distress HEENT: normal  Neck: no JVD, no carotid bruits Cardiac:  normal S1, S2; RRR;  Lungs:  clear to auscultation bilaterally, no wheezing, rhonchi or rales Abd: soft, nontender, no hepatomegaly Ext: no edema Skin: warm and dry Neuro:   no focal abnormalities noted  EKG:  Treadmill test reviewed. : 5:37 on the treadmill.  No clear ischemia, but sx with exercise at that time.   ASSESSMENT AND PLAN:  Dyspnea on exertion /angina Increase Imdur Tablet Extended Release 24 Hour, 60 MG, 1 tablet, Orally, Once a day, 30 day(s), 30, Refills 11    Overton,Shana 12/14/2012 01:06:42 PM > Tiajah Oyster,JAY 12/14/2012 01:37:26 PM > NSR, PRWP, no ST segment changes, LAFB     Eian Vandervelden,JAY 12/15/2012 05:58:20 PM > 5:47 on the treadmill. 5-6/10 chest discomfort with exercise which was relieved with rest. No ECG changes. Normal heart rate recovery. Imdur started. If symptoms persist, we'll plan for coronary angiogram.     exertional chest discomfort/angina  better on imdur, almost completely gone. Start sublingual nitroglycerin. he should contact us if he has any further symptoms.  Would plan on cath if sx persist.  He is aware.  We talked about a nuclear stress test, but he thinks he is fine.  He does not want to pursue a stress test. He is going to the gym and trying to lose weight.  He will let us know if he has any problems.    2. Hypertension  Refill Ramipril Capsule, 10 MG, 1 capsule, Orally, Once a day  continue lifestyle modifications.    3. Obesity (BMI 30-39.9)  Notes: and weight loss. He feels that his diet is pretty healthy. He needs to increase exercise.spoke about the different ways to exercise including water, swimming, recumbent bike.    4. Lipid screening  Notes: LDL 118 at last check in 2013.            Labs    Lab: BNP in 9/14:  B-NATRIURETIC PEPTIDE 13  0-100 - pg/mL   Lakashia Collison,JAY 12/15/2012 12:41:48 PM > normal, no fluid overload Stegall,Amy 12/15/2012 12:53:24 PM > Pt notified.         Preventive Medicine  Adult topics discussed:  Diet: weight loss if needed.      Signed, Mina Marble, MD, [facility] 09/30/2013 4:48 PM

## 2013-10-03 DIAGNOSIS — I208 Other forms of angina pectoris: Secondary | ICD-10-CM | POA: Insufficient documentation

## 2013-10-03 DIAGNOSIS — I10 Essential (primary) hypertension: Secondary | ICD-10-CM | POA: Insufficient documentation

## 2013-10-03 DIAGNOSIS — E669 Obesity, unspecified: Secondary | ICD-10-CM | POA: Insufficient documentation

## 2013-12-15 ENCOUNTER — Ambulatory Visit (INDEPENDENT_AMBULATORY_CARE_PROVIDER_SITE_OTHER): Payer: 59 | Admitting: Interventional Cardiology

## 2013-12-15 ENCOUNTER — Encounter: Payer: Self-pay | Admitting: Interventional Cardiology

## 2013-12-15 VITALS — BP 126/90 | HR 73 | Ht 70.0 in | Wt 269.0 lb

## 2013-12-15 DIAGNOSIS — E669 Obesity, unspecified: Secondary | ICD-10-CM | POA: Diagnosis not present

## 2013-12-15 DIAGNOSIS — K219 Gastro-esophageal reflux disease without esophagitis: Secondary | ICD-10-CM | POA: Diagnosis not present

## 2013-12-15 DIAGNOSIS — I209 Angina pectoris, unspecified: Secondary | ICD-10-CM | POA: Diagnosis not present

## 2013-12-15 DIAGNOSIS — I10 Essential (primary) hypertension: Secondary | ICD-10-CM | POA: Diagnosis not present

## 2013-12-15 MED ORDER — RANITIDINE HCL 150 MG PO TABS: 150.0000 mg | ORAL_TABLET | ORAL | Status: DC | PRN

## 2013-12-15 NOTE — Progress Notes (Signed)
Patient ID: Erik [name], male   DOB: Feb 24, 1946, 68 y.o.   MRN: [id number] Patient ID: Erik [name], male   DOB: 1945/05/08, 68 y.o.   MRN: [id number]       [place], [address], [place]  [address] Phone: [phone] Fax:  [phone]  Date:  12/15/2013   ID:  Erik [name], DOB [birth year], MRN [id number]  PCP:  No primary provider on file.      History of Present Illness: Erik [name] is a 68 y.o. male who has had some fatigue and SHOB in 2014. He was walking up some stairs and was winded at the top of the stairs. He may have had some mild chest tightness. Even showering brings on fatigue. Walking in the house requires a rest afterwards. He had been mowing the lawn, but then had to take breaks in between. No illnesses a tthat time. He had some diarrhea at one point.   He had an ETT in 9/14 which caused angina.  He was started on Imdur, and all of his sx have resolved.  He feels normal.  His fatigue, SHOB, CP have all resolved with his activities as described above.  He still climbs stairs and since starting Imdur, has no sx.  He plans starting exercise with a personal trainer.   He exercises 3x/week.  He uses the bike and the treadmill.  He feels that his sx of discomfort in his chest are essentially gone on the meds.     In August, he had an episode of pressure in his upper abdomen, lasting several days.  It was difficult to find a comfortable position; then sx resolved.  He still has trouble getting comfortable at night.  He sits up and feels better. He tried SL NTG and there was no change in sx.  He is concerned that he is having acid reflux, and may have had food poisoning at the time- although no vomiting or diarrhea.   Wt Readings from Last 3 Encounters:  12/15/13 269 lb (122.018 kg)  09/30/13 272 lb 3.2 oz (123.469 kg)  06/10/13 271 lb (122.925 kg)     Past Medical History  Diagnosis Date  . Kidney stone   . Hypertension   . Tinea corporis   . Obesity      Current Outpatient Prescriptions  Medication Sig Dispense Refill  . aspirin 81 MG tablet Take 81 mg by mouth daily.      . calcium-vitamin D (OSCAL WITH D) 500-200 MG-UNIT per tablet Take 1 tablet by mouth daily.      . clonazePAM (KLONOPIN) 1 MG tablet       . isosorbide mononitrate (IMDUR) 60 MG 24 hr tablet 1 tablet by mouth daily  90 tablet  3  . Multiple Vitamin (MULITIVITAMIN WITH MINERALS) TABS Take 1 tablet by mouth daily.      . nitroGLYCERIN (NITROSTAT) 0.4 MG SL tablet Place 1 tablet (0.4 mg total) under the tongue every 5 (five) minutes as needed for chest pain.  25 tablet  5  . ramipril (ALTACE) 10 MG capsule Take 1 capsule (10 mg total) by mouth daily.  30 capsule  3  . saw palmetto 500 MG capsule Take 500 mg by mouth daily.       No current facility-administered medications for this visit.    Allergies:   No Known Allergies  Social History:  The patient  reports that he has quit smoking. He does not have any smokeless tobacco history  on file.   Family History:  The patient's family history includes Heart disease in his mother; Prostate cancer in his father.   ROS:  Please see the history of present illness.  No nausea, vomiting.  No fevers, chills.  No focal weakness.  No dysuria.   All other systems reviewed and negative.   PHYSICAL EXAM: VS:  BP 126/90  Pulse 73  Ht 5\' 10"  (1.778 m)  Wt 269 lb (122.018 kg)  BMI 38.60 kg/m2 Well nourished, well developed, in no acute distress HEENT: normal Neck: no JVD, no carotid bruits Cardiac:  normal S1, S2; RRR;  Lungs:  clear to auscultation bilaterally, no wheezing, rhonchi or rales Abd: soft, nontender, no hepatomegaly Ext: no edema Skin: warm and dry Neuro:   no focal abnormalities noted  EKG:  Treadmill test reviewed. : 5:37 on the treadmill.  No clear ischemia, but sx with exercise at that time.   NSR, PRWP, NSST-no change  ASSESSMENT AND PLAN:  Dyspnea on exertion /angina - improved; working with Teacher, adult education at work on exercise and diet.  He has increased his exercise significantly.  Increased Imdur Tablet Extended Release 24 Hour, 60 MG, 1 tablet, Orally, Once a day, 30 day(s), 30, Refills 11    Overton,Shana 12/14/2012 01:06:42 PM > Cecilie Heidel,JAY 12/14/2012 01:37:26 PM > NSR, PRWP, no ST segment changes, LAFB     ETT 12/15/2012:  5:47 on the treadmill. 5-6/10 chest discomfort with exercise which was relieved with rest. No ECG changes. Normal heart rate recovery. Imdur started. If symptoms persist, we'll plan for coronary angiogram.  Sx of exhaustion that he had at that time in 9/14 have resolved.     exertional chest discomfort/angina better on imdur, almost completely gone. Start sublingual nitroglycerin. he should contact us if he has any further symptoms.  Would plan on cath if sx persist.  He is aware.  We talked about a nuclear stress test, but he thinks he is fine.  He does not want to pursue a stress test. He is going to the gym and trying to lose weight.  He will let us know if he has any problems.    2. Hypertension  Refill Ramipril Capsule, 10 MG, 1 capsule, Orally, Once a day  continue lifestyle modifications.    3. Obesity (BMI 30-39.9)  Notes: and weight loss. He feels that his diet is pretty healthy. He needs to increase exercise.spoke about the different ways to exercise including water, swimming, recumbent bike.    4. Lipid screening  Notes: LDL 118 at last check in 2013.    5.  Lower abdominal pressure:  Nonexertional.  Decreasing amount he eats at night.  Can try Zantac 150 mg daily prn for acid reflux/GERD.         Labs    Lab: BNP in 9/14:  B-NATRIURETIC PEPTIDE 13  0-100 - pg/mL   Cristianna Cyr,JAY 12/15/2012 12:41:48 PM > normal, no fluid overload Stegall,Amy 12/15/2012 12:53:24 PM > Pt notified.         Preventive Medicine  Adult topics discussed:  Diet: weight loss if needed.      Signed, Mina Marble, MD, [facility] 12/15/2013 8:28 AM

## 2013-12-15 NOTE — Patient Instructions (Signed)
Your physician has recommended you make the following change in your medication:   1. Can [place] Zantac 150 mg as needed acid reflux/indigestion.   Your physician wants you to follow-up in: January of 2016. You will receive a reminder letter in the mail two months in advance. If you don't receive a letter, please call our office to schedule the follow-up appointment.

## 2014-02-22 DIAGNOSIS — L821 Other seborrheic keratosis: Secondary | ICD-10-CM | POA: Diagnosis not present

## 2014-02-22 DIAGNOSIS — L280 Lichen simplex chronicus: Secondary | ICD-10-CM | POA: Diagnosis not present

## 2014-02-22 DIAGNOSIS — L218 Other seborrheic dermatitis: Secondary | ICD-10-CM | POA: Diagnosis not present

## 2014-02-22 DIAGNOSIS — L570 Actinic keratosis: Secondary | ICD-10-CM | POA: Diagnosis not present

## 2014-02-22 DIAGNOSIS — D692 Other nonthrombocytopenic purpura: Secondary | ICD-10-CM | POA: Diagnosis not present

## 2014-02-22 DIAGNOSIS — L738 Other specified follicular disorders: Secondary | ICD-10-CM | POA: Diagnosis not present

## 2014-03-31 ENCOUNTER — Other Ambulatory Visit: Payer: Self-pay | Admitting: Interventional Cardiology

## 2014-04-04 ENCOUNTER — Other Ambulatory Visit: Payer: Self-pay | Admitting: Interventional Cardiology

## 2014-04-06 DIAGNOSIS — M79643 Pain in unspecified hand: Secondary | ICD-10-CM | POA: Diagnosis not present

## 2014-04-06 DIAGNOSIS — Z1211 Encounter for screening for malignant neoplasm of colon: Secondary | ICD-10-CM | POA: Diagnosis not present

## 2014-04-06 DIAGNOSIS — Z125 Encounter for screening for malignant neoplasm of prostate: Secondary | ICD-10-CM | POA: Diagnosis not present

## 2014-04-06 DIAGNOSIS — I10 Essential (primary) hypertension: Secondary | ICD-10-CM | POA: Diagnosis not present

## 2014-04-06 DIAGNOSIS — Z0000 Encounter for general adult medical examination without abnormal findings: Secondary | ICD-10-CM | POA: Diagnosis not present

## 2014-04-06 DIAGNOSIS — G2581 Restless legs syndrome: Secondary | ICD-10-CM | POA: Diagnosis not present

## 2014-04-06 DIAGNOSIS — Z6838 Body mass index (BMI) 38.0-38.9, adult: Secondary | ICD-10-CM | POA: Diagnosis not present

## 2014-04-06 DIAGNOSIS — Z23 Encounter for immunization: Secondary | ICD-10-CM | POA: Diagnosis not present

## 2014-04-06 DIAGNOSIS — R0609 Other forms of dyspnea: Secondary | ICD-10-CM | POA: Diagnosis not present

## 2014-04-06 DIAGNOSIS — Z1389 Encounter for screening for other disorder: Secondary | ICD-10-CM | POA: Diagnosis not present

## 2014-04-15 ENCOUNTER — Ambulatory Visit: Payer: 59 | Admitting: Interventional Cardiology

## 2014-04-28 DIAGNOSIS — A09 Infectious gastroenteritis and colitis, unspecified: Secondary | ICD-10-CM | POA: Diagnosis not present

## 2014-04-28 DIAGNOSIS — R1310 Dysphagia, unspecified: Secondary | ICD-10-CM | POA: Diagnosis not present

## 2014-04-28 DIAGNOSIS — K219 Gastro-esophageal reflux disease without esophagitis: Secondary | ICD-10-CM | POA: Diagnosis not present

## 2014-05-02 ENCOUNTER — Other Ambulatory Visit: Payer: Self-pay | Admitting: Interventional Cardiology

## 2014-05-05 ENCOUNTER — Ambulatory Visit (INDEPENDENT_AMBULATORY_CARE_PROVIDER_SITE_OTHER): Payer: 59 | Admitting: Interventional Cardiology

## 2014-05-05 ENCOUNTER — Encounter: Payer: Self-pay | Admitting: Interventional Cardiology

## 2014-05-05 VITALS — BP 116/78 | HR 75 | Ht 70.0 in | Wt 255.0 lb

## 2014-05-05 DIAGNOSIS — I10 Essential (primary) hypertension: Secondary | ICD-10-CM | POA: Diagnosis not present

## 2014-05-05 DIAGNOSIS — K219 Gastro-esophageal reflux disease without esophagitis: Secondary | ICD-10-CM | POA: Diagnosis not present

## 2014-05-05 DIAGNOSIS — R079 Chest pain, unspecified: Secondary | ICD-10-CM

## 2014-05-05 DIAGNOSIS — E669 Obesity, unspecified: Secondary | ICD-10-CM

## 2014-05-05 NOTE — Progress Notes (Signed)
Patient ID: Erik [name], male   DOB: 1945-11-22, 69 y.o.   MRN: [id number] Patient ID: Erik [name], male   DOB: Sep 01, 1945, 69 y.o.   MRN: [id number] Patient ID: Erik [name], male   DOB: 08/30/1945, 69 y.o.   MRN: [id number]       [place], [address], [place]  [address] Phone: [phone] Fax:  [phone]  Date:  05/05/2014   ID:  Erik [name], DOB 03/28/1945, MRN [id number]  PCP:  Cloyd Stagers, MD      History of Present Illness: Erik [name] is a 69 y.o. male who has had some fatigue and SHOB in 2014. He was walking up some stairs and was winded at the top of the stairs. He may have had some mild chest tightness. Even showering brings on fatigue. Walking in the house requires a rest afterwards. He had been mowing the lawn, but then had to take breaks in between. No illnesses a tthat time. He had some diarrhea at one point.   He had an ETT in 9/14 which caused angina.  He was started on Imdur, and all of his sx have resolved.  He feels normal.  His fatigue, SHOB, CP have all resolved with his activities as described above.  He still climbs stairs and since starting Imdur, has no sx.  He plans starting exercise with a personal trainer.   He exercises 3x/week.  He uses the bike and the treadmill.  He feels that his sx of discomfort in his chest are essentially gone on the meds.        Wt Readings from Last 3 Encounters:  05/05/14 255 lb (115.667 kg)  12/15/13 269 lb (122.018 kg)  09/30/13 272 lb 3.2 oz (123.469 kg)     Past Medical History  Diagnosis Date  . Kidney stone   . Hypertension   . Tinea corporis   . Obesity     Current Outpatient Prescriptions  Medication Sig Dispense Refill  . aspirin 81 MG tablet Take 81 mg by mouth daily.    . B Complex Vitamins (VITAMIN B COMPLEX) TABS Take by mouth daily.    . calcium-vitamin D (OSCAL WITH D) 500-200 MG-UNIT per tablet Take 1 tablet by mouth daily.    . clonazePAM (KLONOPIN) 1 MG tablet  Take 1 mg by mouth at bedtime.     . isosorbide mononitrate (IMDUR) 60 MG 24 hr tablet Take 1 tablet by mouth  daily 90 tablet 3  . Multiple Vitamin (MULITIVITAMIN WITH MINERALS) TABS Take 1 tablet by mouth daily.    . nitroGLYCERIN (NITROSTAT) 0.4 MG SL tablet Place 1 tablet (0.4 mg total) under the tongue every 5 (five) minutes as needed for chest pain. 25 tablet 5  . ramipril (ALTACE) 10 MG capsule Take 1 capsule (10 mg total) by mouth daily. 30 capsule 3  . ranitidine (ZANTAC) 150 MG tablet Take 1 tablet (150 mg total) by mouth as needed for heartburn.    . saw palmetto 500 MG capsule Take 500 mg by mouth daily.     No current facility-administered medications for this visit.    Allergies:   No Known Allergies  Social History:  The patient  reports that he has quit smoking. He does not have any smokeless tobacco history on file.   Family History:  The patient's family history includes Heart disease in his mother; Prostate cancer in his father.   ROS:  Please see the history of present illness.  No nausea, vomiting.  No fevers, chills.  No focal weakness.  No dysuria.   All other systems reviewed and negative.   PHYSICAL EXAM: VS:  BP 116/78 mmHg  Pulse 75  Ht 5\' 10"  (1.778 m)  Wt 255 lb (115.667 kg)  BMI 36.59 kg/m2 Well nourished, well developed, in no acute distress HEENT: normal Neck: no JVD, no carotid bruits Cardiac:  normal S1, S2; RRR;  Lungs:  clear to auscultation bilaterally, no wheezing, rhonchi or rales Abd: soft, nontender, no hepatomegaly Ext: no edema Skin: warm and dry Neuro:   no focal abnormalities noted Psych: normal affect  EKG:  Treadmill test reviewed. : 5:37 on the treadmill.  No clear ischemia, but sx with exercise at that time.   NSR, PRWP, NSST-no change  ASSESSMENT AND PLAN:  Dyspnea on exertion /angina - improved; working with Physiological scientist at work on exercise and diet.  He has increased his exercise significantly.  Increased Imdur Tablet  Extended Release 24 Hour, 60 MG, 1 tablet, Orally, Once a day, 30 day(s), 30, Refills 11         ETT 12/15/2012:  5:47 on the treadmill. 5-6/10 chest discomfort with exercise which was relieved with rest. No ECG changes. Normal heart rate recovery. Imdur started. If symptoms persist, we'll plan for coronary angiogram.  Sx of exhaustion that he had at that time in 9/14 have resolved.     exertional chest discomfort/angina better on imdur, almost completely gone.  Had one episode of chest pain relieved with NTG a week ago.He should contact us if he has any further symptoms.  Would plan on cath if sx persist.  He is aware.  We talked about a nuclear stress test, but he thinks he is fine.  He does not want to pursue a stress test. He is going to the gym and trying to lose weight.  He will let us know if he has any problems and wants further testing.  Episode mentioned was not enough to really stop him from doing his activities.  He thinks some of his sx may be related to GERD.    2. Hypertension  Refill Ramipril Capsule, 10 MG, 1 capsule, Orally, Once a day  continue lifestyle modifications.    3. Obesity (BMI 30-39.9)  Notes: and weight loss. He feels that his diet is pretty healthy. He needs to increase exercise.  Continue work with his Clinical research associate.    4. Lipid screening  Notes: LDL 118 at last check in 2013.    5.  Lower abdominal pressure:  Resolved.  Has intentionally lost weight.         Labs    Lab: BNP in 9/14:  B-NATRIURETIC PEPTIDE 13  0-100 - pg/mL            Preventive Medicine  Adult topics discussed:  Diet: weight loss .      Signed, Mina Marble, MD, [facility] 05/05/2014 4:11 PM

## 2014-05-05 NOTE — Patient Instructions (Signed)
Your physician recommends that you continue on your current medications as directed. Please refer to the Current Medication list given to you today.  Your physician wants you to follow-up in: October 2016. You will receive a reminder letter in the mail two months in advance. If you don't receive a letter, please call our office to schedule the follow-up appointment.

## 2014-06-03 DIAGNOSIS — K222 Esophageal obstruction: Secondary | ICD-10-CM | POA: Diagnosis not present

## 2014-06-03 DIAGNOSIS — K5730 Diverticulosis of large intestine without perforation or abscess without bleeding: Secondary | ICD-10-CM | POA: Diagnosis not present

## 2014-06-03 DIAGNOSIS — K449 Diaphragmatic hernia without obstruction or gangrene: Secondary | ICD-10-CM | POA: Diagnosis not present

## 2014-06-03 DIAGNOSIS — K219 Gastro-esophageal reflux disease without esophagitis: Secondary | ICD-10-CM | POA: Diagnosis not present

## 2014-06-03 DIAGNOSIS — R1310 Dysphagia, unspecified: Secondary | ICD-10-CM | POA: Diagnosis not present

## 2014-06-03 DIAGNOSIS — Z1211 Encounter for screening for malignant neoplasm of colon: Secondary | ICD-10-CM | POA: Diagnosis not present

## 2014-06-27 ENCOUNTER — Other Ambulatory Visit: Payer: Self-pay | Admitting: Interventional Cardiology

## 2014-10-13 ENCOUNTER — Other Ambulatory Visit: Payer: Self-pay | Admitting: Interventional Cardiology

## 2014-10-13 ENCOUNTER — Other Ambulatory Visit: Payer: Self-pay

## 2014-10-13 MED ORDER — RAMIPRIL 10 MG PO CAPS: ORAL_CAPSULE | ORAL | Status: DC

## 2014-11-10 ENCOUNTER — Encounter: Payer: Self-pay | Admitting: Interventional Cardiology

## 2014-12-14 DIAGNOSIS — K0510 Chronic gingivitis, plaque induced: Secondary | ICD-10-CM | POA: Diagnosis not present

## 2014-12-14 DIAGNOSIS — Z23 Encounter for immunization: Secondary | ICD-10-CM | POA: Diagnosis not present

## 2015-01-03 ENCOUNTER — Emergency Department (HOSPITAL_COMMUNITY)
Admission: EM | Admit: 2015-01-03 | Discharge: 2015-01-03 | Disposition: A | Discharge status: Home / Self Care | Payer: BLUE CROSS/BLUE SHIELD | Attending: Emergency Medicine | Admitting: Emergency Medicine

## 2015-01-03 ENCOUNTER — Emergency Department (HOSPITAL_COMMUNITY): Payer: BLUE CROSS/BLUE SHIELD

## 2015-01-03 ENCOUNTER — Encounter (HOSPITAL_COMMUNITY): Payer: Self-pay | Admitting: Emergency Medicine

## 2015-01-03 DIAGNOSIS — R5383 Other fatigue: Secondary | ICD-10-CM | POA: Insufficient documentation

## 2015-01-03 DIAGNOSIS — E669 Obesity, unspecified: Secondary | ICD-10-CM | POA: Diagnosis not present

## 2015-01-03 DIAGNOSIS — Z7982 Long term (current) use of aspirin: Secondary | ICD-10-CM | POA: Insufficient documentation

## 2015-01-03 DIAGNOSIS — Z87891 Personal history of nicotine dependence: Secondary | ICD-10-CM | POA: Insufficient documentation

## 2015-01-03 DIAGNOSIS — R0602 Shortness of breath: Secondary | ICD-10-CM | POA: Diagnosis not present

## 2015-01-03 DIAGNOSIS — M79671 Pain in right foot: Secondary | ICD-10-CM | POA: Insufficient documentation

## 2015-01-03 DIAGNOSIS — Z79899 Other long term (current) drug therapy: Secondary | ICD-10-CM | POA: Diagnosis not present

## 2015-01-03 DIAGNOSIS — Z87442 Personal history of urinary calculi: Secondary | ICD-10-CM | POA: Insufficient documentation

## 2015-01-03 DIAGNOSIS — I10 Essential (primary) hypertension: Secondary | ICD-10-CM | POA: Diagnosis not present

## 2015-01-03 NOTE — ED Provider Notes (Signed)
CSN: [id number]     Arrival date & time 01/03/15  1952 History  By signing my name below, I, Emmanuella Mensah, attest that this documentation has been prepared under the direction and in the presence of Etta Quill, NP. Electronically Signed: Judithann Sauger, ED Scribe. 01/03/2015. 10:31 PM.     Chief Complaint  Patient presents with  . Foot Pain    The patient just woke up yesterday with foot pain.  He denies any injury.  He cannot put pressure on the right foot.  He rates his pain 10/10.   Patient is a 69 y.o. male presenting with lower extremity pain. The history is provided by the patient. No language interpreter was used.  Foot Pain This is a new problem. The current episode started yesterday. The problem occurs rarely. The problem has not changed since onset.Associated symptoms include shortness of breath. Pertinent negatives include no chest pain. The symptoms are aggravated by walking and exertion. Nothing relieves the symptoms. He has tried nothing for the symptoms.   HPI Comments: Erik [name] is a 69 y.o. male who presents to the Emergency Department complaining of intermittent right foot pain that radiates up his right leg when he puts weight on it onset yesterday while he was at work. He reports associated fatigue, SOB, difficulty walking and pain with weight bearing. He denies any CP. He denies a hx of foreign object in his foot but adds that he has titanium knees. 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 He states that he was working a lot around the house this weekend and that could be contributing to his symptoms. Pt has an appt with PCP tomorrow and another one with his Cardiologist in about 3 days. He reports that he is no longer on Meloxicam for arthritis and will find out tomorrow from his PCP if he needs to get back on it. He also takes 81 mg of aspirin daily but has been taking 500 mg daily for the past few days.   Past Medical History   Diagnosis Date  . Kidney stone   . Hypertension   . Tinea corporis   . Obesity    Past Surgical History  Procedure Laterality Date  . Knee arthroplasty     Family History  Problem Relation Age of Onset  . Heart disease Mother   . Prostate cancer Father    Social History  Substance Use Topics  . Smoking status: Former Research scientist (life sciences)  . Smokeless tobacco: None  . Alcohol Use: None    Review of Systems  Constitutional: Positive for fatigue. Negative for fever and chills.  Respiratory: Positive for shortness of breath.   Cardiovascular: Negative for chest pain.  Musculoskeletal: Positive for arthralgias.  All other systems reviewed and are negative.     Allergies  Review of patient's allergies indicates no known allergies.  Home Medications   Prior to Admission medications   Medication Sig Start Date End Date Taking? Authorizing Provider  aspirin 81 MG tablet Take 81 mg by mouth daily.    Historical Provider, MD  B Complex Vitamins (VITAMIN B COMPLEX) TABS Take by mouth daily.    Historical Provider, MD  calcium-vitamin D (OSCAL WITH D) 500-200 MG-UNIT per tablet Take 1 tablet by mouth daily.    Historical Provider, MD  clonazePAM (KLONOPIN) 1 MG tablet Take 1 mg by mouth at bedtime.  07/12/13   Historical Provider, MD  isosorbide mononitrate (IMDUR) 60 MG 24 hr tablet  Take 1 tablet by mouth  daily 05/03/14   Jettie Booze, MD  Multiple Vitamin (MULITIVITAMIN WITH MINERALS) TABS Take 1 tablet by mouth daily.    Historical Provider, MD  nitroGLYCERIN (NITROSTAT) 0.4 MG SL tablet Place 1 tablet (0.4 mg total) under the tongue every 5 (five) minutes as needed for chest pain. 02/05/13   Jettie Booze, MD  ramipril (ALTACE) 10 MG capsule Take 1 capsule by mouth  daily 10/13/14   Jettie Booze, MD  ranitidine (ZANTAC) 150 MG tablet Take 1 tablet (150 mg total) by mouth as needed for heartburn. 12/15/13   Jettie Booze, MD  saw palmetto 500 MG capsule Take 500 mg by  mouth daily.    Historical Provider, MD   BP 112/76 mmHg  Pulse 97  Temp(Src) 99.9 F (37.7 C) (Oral)  Resp 18  Ht 5\' 8"  (1.727 m)  Wt 260 lb (117.935 kg)  BMI 39.54 kg/m2  SpO2 96% Physical Exam  Constitutional: He is oriented to person, place, and time. He appears well-developed and well-nourished.  HENT:  Head: Normocephalic and atraumatic.  Cardiovascular: Normal rate.   Pulmonary/Chest: Effort normal.  Abdominal: He exhibits no distension.  Neurological: He is alert and oriented to person, place, and time.  Skin: Skin is warm and dry.  Psychiatric: He has a normal mood and affect.  Nursing note and vitals reviewed.   ED Course  Procedures (including critical care time) DIAGNOSTIC STUDIES: Oxygen Saturation is 96% on RA, normal by my interpretation.    COORDINATION OF CARE: 10:30 PM- Pt advised of plan for treatment and pt agrees. Pt advised to use ace wrap and crutches. Since he does not have pain when he is not bearing weight, recommended that there is no need for Narcotics to treat his pain and a 325 mg Tylenol will work.    Imaging Review Dg Foot 2 Views Right  01/03/2015   CLINICAL DATA:  Right foot swelling common no known injury, initial encounter  EXAM: RIGHT FOOT - 2 VIEW  COMPARISON:  None.  FINDINGS: No acute fracture or dislocation is identified. There is a metallic foreign body adjacent to the fifth proximal phalanx. Clinical correlation is recommended. This may be the etiology of the underlying swelling. No bony erosion is noted.  IMPRESSION: Metallic foreign body adjacent to the fifth proximal phalanx. Clinical correlation is recommended. No acute bony abnormality is seen.   Electronically Signed   By: Inez Catalina M.D.   On: 01/03/2015 21:42   Etta Quill, NP has personally reviewed and evaluated these images as part of his medical decision-making.   EKG Interpretation None     No bony injury noted on review of xrays.  Patient has been on his feet more  the last several days moving boxes upstairs in his home.  Pain seems to be primarily along the plantar fascia.   Patient discussed with and seen by Dr. Doy Mince.  Ace wrap, crutches,  Patient is scheduled to see his PCP tomorrow,  Return precautions discussed. MDM   Final diagnoses:  None    Foot pain  I personally performed the services described in this documentation, which was scribed in my presence. The recorded information has been reviewed and is accurate.    Etta Quill, NP 01/03/15 2358  Serita Grit, MD 01/06/15 2028

## 2015-01-03 NOTE — ED Notes (Signed)
The patient just woke up yesterday with foot pain.  He denies any injury.  He cannot put pressure on the right foot.  He rates his pain 10/10.  He advised he has taken aspirin for the pain .

## 2015-01-03 NOTE — Discharge Instructions (Signed)

## 2015-01-03 NOTE — ED Notes (Signed)
Reviewed discharge instructions with patient.  Ace wrap applied to R foot/ankle and educated patient on crutch use.  Patient verbalized understanding.

## 2015-01-04 DIAGNOSIS — R5383 Other fatigue: Secondary | ICD-10-CM | POA: Diagnosis not present

## 2015-01-04 DIAGNOSIS — M1990 Unspecified osteoarthritis, unspecified site: Secondary | ICD-10-CM | POA: Diagnosis not present

## 2015-01-04 DIAGNOSIS — R399 Unspecified symptoms and signs involving the genitourinary system: Secondary | ICD-10-CM | POA: Diagnosis not present

## 2015-01-04 DIAGNOSIS — R1013 Epigastric pain: Secondary | ICD-10-CM | POA: Diagnosis not present

## 2015-01-04 DIAGNOSIS — R0602 Shortness of breath: Secondary | ICD-10-CM | POA: Diagnosis not present

## 2015-01-05 ENCOUNTER — Ambulatory Visit
Admission: RE | Admit: 2015-01-05 | Discharge: 2015-01-05 | Disposition: A | Discharge status: Home / Self Care | Payer: BLUE CROSS/BLUE SHIELD | Source: Ambulatory Visit | Attending: Family Medicine | Admitting: Family Medicine

## 2015-01-05 ENCOUNTER — Encounter: Payer: Self-pay | Admitting: Interventional Cardiology

## 2015-01-05 ENCOUNTER — Other Ambulatory Visit: Payer: Self-pay | Admitting: Family Medicine

## 2015-01-05 ENCOUNTER — Ambulatory Visit (INDEPENDENT_AMBULATORY_CARE_PROVIDER_SITE_OTHER): Payer: BLUE CROSS/BLUE SHIELD | Admitting: Interventional Cardiology

## 2015-01-05 VITALS — BP 122/82 | HR 100 | Ht 69.0 in | Wt 255.0 lb

## 2015-01-05 DIAGNOSIS — R9431 Abnormal electrocardiogram [ECG] [EKG]: Secondary | ICD-10-CM

## 2015-01-05 DIAGNOSIS — E669 Obesity, unspecified: Secondary | ICD-10-CM

## 2015-01-05 DIAGNOSIS — R0602 Shortness of breath: Secondary | ICD-10-CM

## 2015-01-05 DIAGNOSIS — I209 Angina pectoris, unspecified: Secondary | ICD-10-CM

## 2015-01-05 DIAGNOSIS — I10 Essential (primary) hypertension: Secondary | ICD-10-CM | POA: Diagnosis not present

## 2015-01-05 MED ORDER — METOPROLOL TARTRATE 25 MG PO TABS: 25.0000 mg | ORAL_TABLET | Freq: Two times a day (BID) | ORAL | Status: DC

## 2015-01-05 MED ORDER — NITROGLYCERIN 0.4 MG SL SUBL: 0.4000 mg | SUBLINGUAL_TABLET | SUBLINGUAL | Status: DC | PRN

## 2015-01-05 NOTE — Patient Instructions (Addendum)
**Note De-Identified Erik [name] Obfuscation** Medication Instructions:  Start taking Metoprolol 25 mg twice daily-all other medications remain the same.  Labwork: None  Testing/Procedures: None  Follow-Up: Please call Jeani Hawking at [phone] the office in 1 week to let us know if you continue to have chest pain. If so, we will arrange a cardiac cath.

## 2015-01-05 NOTE — Progress Notes (Signed)
Patient ID: Erik [name], male   DOB: Jun 22, 1945, 69 y.o.   MRN: [id number]     Cardiology Office Note   Date:  01/05/2015   ID:  [name], DOB 10-14-1945, MRN [id number]  PCP:  Jonathon Bellows, MD    Chief Complaint  Patient presents with  . C/O Fatigue and Exertional Dyspnea     Wt Readings from Last 3 Encounters:  01/05/15 255 lb (115.667 kg)  01/03/15 260 lb (117.935 kg)  05/05/14 255 lb (115.667 kg)       History of Present Illness: Erik [name] is a 69 y.o. male  who has had some fatigue and SHOB in 2014. He was walking up some stairs and was winded at the top of the stairs. He may have had some mild chest tightness.   He had an ETT in 9/14 which caused angina. He was started on Imdur, and all of his sx  Resolved.  More recently, his chest pain has returned.  His meloxicam was stopped and an antibiotic was started.   He was fatigued with the antibiotic.  He works around the house and can get some [facility], particularly with walking up the stairs.  He thinks that his sx may be related to the antibiotic.  We have discussed heart cath in the past.  He is more open to the idea now that he is 69.      Past Medical History  Diagnosis Date  . Kidney stone   . Hypertension   . Tinea corporis   . Obesity     Past Surgical History  Procedure Laterality Date  . Knee arthroplasty       Current Outpatient Prescriptions  Medication Sig Dispense Refill  . aspirin 81 MG tablet Take 81 mg by mouth daily.    . B Complex Vitamins (VITAMIN B COMPLEX) TABS Take 1 tablet by mouth daily.     . calcium-vitamin D (OSCAL WITH D) 500-200 MG-UNIT per tablet Take 1 tablet by mouth daily.    . clonazePAM (KLONOPIN) 1 MG tablet Take 1 mg by mouth at bedtime.     . isosorbide mononitrate (IMDUR) 60 MG 24 hr tablet Take 1 tablet by mouth  daily 90 tablet 3  . meloxicam (MOBIC) 15 MG tablet Take 15 mg by mouth daily.  0  . Multiple Vitamin (MULITIVITAMIN WITH MINERALS) TABS Take 1 tablet  by mouth daily.    . nitroGLYCERIN (NITROSTAT) 0.4 MG SL tablet Place 1 tablet (0.4 mg total) under the tongue every 5 (five) minutes as needed for chest pain. 25 tablet 5  . ramipril (ALTACE) 10 MG capsule Take 1 capsule by mouth  daily 90 capsule 3  . ranitidine (ZANTAC) 150 MG tablet Take 1 tablet (150 mg total) by mouth as needed for heartburn.    . saw palmetto 500 MG capsule Take 500 mg by mouth daily.     No current facility-administered medications for this visit.    Allergies:   Review of patient's allergies indicates no known allergies.    Social History:  The patient  reports that he has quit smoking. He has never used smokeless tobacco.   Family History:  The patient's *family history includes Heart disease in his mother; Prostate cancer in his father.    ROS:  Please see the history of present illness.   Otherwise, review of systems are positive for right foot pain.   All other systems are reviewed and negative.    PHYSICAL EXAM: VS:  BP 122/82 mmHg  Pulse 100  Ht 5\' 9"  (1.753 m)  Wt 255 lb (115.667 kg)  BMI 37.64 kg/m2 , BMI Body mass index is 37.64 kg/(m^2). GEN: Well nourished, well developed, in no acute distress HEENT: normal Neck: no JVD, carotid bruits, or masses Cardiac: RRR; no murmurs, rubs, or gallops,no edema  Respiratory:  clear to auscultation bilaterally, normal work of breathing GI: soft, nontender, nondistended, + BS MS: no deformity or atrophy Skin: warm and dry, no rash, right foot wrapped Neuro:  Strength and sensation are intact Psych: euthymic mood, full affect   EKG:   The ekg ordered today demonstrates sinus tach, inferior T wave inversions which are new   Recent Labs: No results found for requested labs within last 365 days.   Lipid Panel    Component Value Date/Time   CHOL 198 07/05/2009 0957   TRIG 98.0 07/05/2009 0957   HDL 64.50 07/05/2009 0957   CHOLHDL 3 07/05/2009 0957   VLDL 19.6 07/05/2009 0957   LDLCALC 114*  07/05/2009 0957     Other studies Reviewed: Additional studies/ records that were reviewed today with results demonstrating: 2015 ECG, no ST changes.   ASSESSMENT AND PLAN:  1. Angina:  COntinue isorbide.  Start low dose metoprolol 25 mg BID.  HR should tolerate. 2. Obesity: He would beneifit from weight loss.   3. Abnormal ECG:  T wave changes.  Discussed cath again.  He is still hesitant.  IF his sx persist after another week, would plan on cardiac cath.  No surgery planned on foot.  He will call back in a week.   4. Essential HTN: BP controlled.   Current medicines are reviewed at length with the patient today.  The patient concerns regarding his medicines were addressed.  The following changes have been made:  Add metoprolol  Labs/ tests ordered today include:  No orders of the defined types were placed in this encounter.    Recommend 150 minutes/week of aerobic exercise Low fat, low carb, high fiber diet recommended  Disposition:   FU in 1 week with phone call   Teresita Madura., MD  01/05/2015 4:16 PM    [facility] [address], [place], [place]  [address] Phone: [phone]; Fax: [phone]

## 2015-01-12 ENCOUNTER — Encounter: Payer: Self-pay | Admitting: Interventional Cardiology

## 2015-01-13 ENCOUNTER — Telehealth: Payer: Self-pay | Admitting: Interventional Cardiology

## 2015-01-13 NOTE — Telephone Encounter (Signed)
Calling stating he just received his Metoprolol from mail order last PM and  he took one dose last PM and again this AM. Was told to call in one week but since hasn't been on medication a week he wanted to let us know.  States he has not had any SOB, CP for past couple of days.  States he feels fine. Advised to call Jeani Hawking Via,LPN in about a week to update her on his symptoms and if medication seems to be helping. Advised will forward to Dr. Irish Lack and Jeani Hawking Via,LPN

## 2015-01-13 NOTE — Telephone Encounter (Signed)
New message  Pt called states that he just received his medication. Has taken two dosages. He will call back later to give the results of his medication. Will call back after 1 week.

## 2015-01-19 NOTE — Telephone Encounter (Signed)
The pt states that he started taking his Metoprolol 25 mg BID one week ago and that he is no longer having any chest pain at all at this time. He also reports that he has not had any exertional CP and can now walk up a flight of stairs without CP. He is aware that Im sending this message to Dr Irish Lack as Juluis Rainier.

## 2015-01-31 ENCOUNTER — Telehealth: Payer: Self-pay | Admitting: Interventional Cardiology

## 2015-01-31 NOTE — Telephone Encounter (Signed)
Reviewed last office visit note; instructed patient that Dr. Irish Lack recommends 150 min of aerobic exercise per week; in addition to low fat, low carb high fiber diet. Pt verbalizes understanding and appreciative for the call back.

## 2015-01-31 NOTE — Telephone Encounter (Signed)
New Message  Pt requesting to hear from Dr Irish Lack on which type of exercise he can do in his condition. Please call back and disucss.

## 2015-02-01 NOTE — Telephone Encounter (Signed)
Walking, bike, water aerobics would all be fine

## 2015-02-24 DIAGNOSIS — L814 Other melanin hyperpigmentation: Secondary | ICD-10-CM | POA: Diagnosis not present

## 2015-02-24 DIAGNOSIS — L821 Other seborrheic keratosis: Secondary | ICD-10-CM | POA: Diagnosis not present

## 2015-02-24 DIAGNOSIS — D225 Melanocytic nevi of trunk: Secondary | ICD-10-CM | POA: Diagnosis not present

## 2015-02-24 DIAGNOSIS — L218 Other seborrheic dermatitis: Secondary | ICD-10-CM | POA: Diagnosis not present

## 2015-02-24 DIAGNOSIS — L738 Other specified follicular disorders: Secondary | ICD-10-CM | POA: Diagnosis not present

## 2015-02-24 DIAGNOSIS — D692 Other nonthrombocytopenic purpura: Secondary | ICD-10-CM | POA: Diagnosis not present

## 2015-02-24 DIAGNOSIS — D1801 Hemangioma of skin and subcutaneous tissue: Secondary | ICD-10-CM | POA: Diagnosis not present

## 2015-02-24 DIAGNOSIS — L570 Actinic keratosis: Secondary | ICD-10-CM | POA: Diagnosis not present

## 2015-02-24 DIAGNOSIS — L853 Xerosis cutis: Secondary | ICD-10-CM | POA: Diagnosis not present

## 2015-03-01 DIAGNOSIS — H43811 Vitreous degeneration, right eye: Secondary | ICD-10-CM | POA: Diagnosis not present

## 2015-04-26 ENCOUNTER — Telehealth: Payer: Self-pay | Admitting: Interventional Cardiology

## 2015-04-26 ENCOUNTER — Other Ambulatory Visit: Payer: Self-pay | Admitting: *Deleted

## 2015-04-26 MED ORDER — ISOSORBIDE MONONITRATE ER 60 MG PO TB24: ORAL_TABLET | ORAL | Status: DC

## 2015-04-26 NOTE — Telephone Encounter (Signed)
°*  STAT* If patient is at the pharmacy, call can be transferred to refill team.   1. Which medications need to be refilled? (please list name of each medication and dose if known) Isosorbide 60mg    2. Which pharmacy/location (including street and city if local pharmacy) is medication to be sent to? [facility] Needs a new prescription sent   3. Do they need a 30 day or 90 day supply? [place]

## 2015-06-23 ENCOUNTER — Other Ambulatory Visit: Payer: Self-pay | Admitting: *Deleted

## 2015-06-23 MED ORDER — NITROGLYCERIN 0.4 MG SL SUBL: 0.4000 mg | SUBLINGUAL_TABLET | SUBLINGUAL | Status: DC | PRN

## 2015-06-23 MED ORDER — METOPROLOL TARTRATE 25 MG PO TABS: 25.0000 mg | ORAL_TABLET | Freq: Two times a day (BID) | ORAL | Status: DC

## 2015-06-28 ENCOUNTER — Telehealth: Payer: Self-pay | Admitting: Interventional Cardiology

## 2015-06-28 NOTE — Telephone Encounter (Signed)
New message    Pt c/o Shortness Of Breath: STAT if SOB developed within the last 24 hours or pt is noticeably SOB on the phone  1. Are you currently SOB (can you hear that pt is SOB on the phone)? no 2. How long have you been experiencing SOB? month 3. Are you SOB when sitting or when up moving around? Both mostly laying down at night  4. Are you currently experiencing any other symptoms? Cant walk, light headed

## 2015-06-28 NOTE — Telephone Encounter (Signed)
**Note De-Identified Erik [name] Obfuscation** The pt states that he has been having SOB with light exertion, fatigue, weakness and some occasional chest tightness for about a month.  He reports that his SOB is worse when he is laying down. He states that last night he was having chest tightness and was so SOB that he had to sit up on the side of the bed to breath better. He states that he took 1 NTG tablet and his chest tightness went away. He states that he cannot walk for exercise anymore due to his fatigue and weakness. He denies edema and states that he does not weight daily but does not think he has gained any weight. He also denies chest pain (only tightness)and dizziness at this time.   He states that he is concerned about his s/s as he reports that he had an episode last summer where he passed out in his yard while mowing his lawn and hasn't "felt right since".  He wants to know what he should do. Please advise.

## 2015-06-29 ENCOUNTER — Other Ambulatory Visit: Payer: Self-pay | Admitting: Interventional Cardiology

## 2015-06-29 DIAGNOSIS — I209 Angina pectoris, unspecified: Secondary | ICD-10-CM

## 2015-06-29 NOTE — Telephone Encounter (Signed)
We need to set him up for a cath week after next. That was the plan based on my last note, if sx returned.  He can see an APP to get labs etc, but that would be my recommendation.

## 2015-06-29 NOTE — Telephone Encounter (Signed)
**Note De-Identified Aileene Lanum Obfuscation** The pt is scheduled to see Richardson Dopp, PA-c.tomorrow morning at 9:10. The pt is aware and is in agreement with plan to see [facility] tomorrow and to have cath.

## 2015-06-30 ENCOUNTER — Encounter: Payer: Self-pay | Admitting: Physician Assistant

## 2015-06-30 ENCOUNTER — Encounter: Payer: Self-pay | Admitting: *Deleted

## 2015-06-30 ENCOUNTER — Ambulatory Visit (INDEPENDENT_AMBULATORY_CARE_PROVIDER_SITE_OTHER): Payer: BLUE CROSS/BLUE SHIELD | Admitting: Physician Assistant

## 2015-06-30 VITALS — BP 122/80 | HR 68 | Ht 69.0 in | Wt 279.0 lb

## 2015-06-30 DIAGNOSIS — R0602 Shortness of breath: Secondary | ICD-10-CM | POA: Diagnosis not present

## 2015-06-30 DIAGNOSIS — R0683 Snoring: Secondary | ICD-10-CM

## 2015-06-30 DIAGNOSIS — R072 Precordial pain: Secondary | ICD-10-CM

## 2015-06-30 DIAGNOSIS — I209 Angina pectoris, unspecified: Secondary | ICD-10-CM

## 2015-06-30 DIAGNOSIS — R55 Syncope and collapse: Secondary | ICD-10-CM | POA: Diagnosis not present

## 2015-06-30 DIAGNOSIS — I10 Essential (primary) hypertension: Secondary | ICD-10-CM | POA: Diagnosis not present

## 2015-06-30 DIAGNOSIS — I208 Other forms of angina pectoris: Secondary | ICD-10-CM

## 2015-06-30 LAB — CBC WITH DIFFERENTIAL/PLATELET
BASOS PCT: 2 %
Basophils Absolute: 190 cells/uL (ref 0–200)
EOS ABS: 285 {cells}/uL (ref 15–500)
Eosinophils Relative: 3 %
HEMATOCRIT: 40.1 % (ref 38.5–50.0)
HEMOGLOBIN: 12.9 g/dL — AB (ref 13.2–17.1)
LYMPHS ABS: 2375 {cells}/uL (ref 850–3900)
Lymphocytes Relative: 25 %
MCH: 26.9 pg — ABNORMAL LOW (ref 27.0–33.0)
MCHC: 32.2 g/dL (ref 32.0–36.0)
MCV: 83.5 fL (ref 80.0–100.0)
MONO ABS: 1045 {cells}/uL — AB (ref 200–950)
MPV: 9.8 fL (ref 7.5–12.5)
Monocytes Relative: 11 %
NEUTROS ABS: 5605 {cells}/uL (ref 1500–7800)
Neutrophils Relative %: 59 %
PLATELETS: 338 10*3/uL (ref 140–400)
RBC: 4.8 MIL/uL (ref 4.20–5.80)
RDW: 16.2 % — ABNORMAL HIGH (ref 11.0–15.0)
WBC: 9.5 10*3/uL (ref 3.8–10.8)

## 2015-06-30 LAB — BASIC METABOLIC PANEL
BUN: 12 mg/dL (ref 7–25)
CHLORIDE: 105 mmol/L (ref 98–110)
CO2: 27 mmol/L (ref 20–31)
Calcium: 9.4 mg/dL (ref 8.6–10.3)
Creat: 1.1 mg/dL (ref 0.70–1.18)
Glucose, Bld: 100 mg/dL — ABNORMAL HIGH (ref 65–99)
POTASSIUM: 4.9 mmol/L (ref 3.5–5.3)
Sodium: 139 mmol/L (ref 135–146)

## 2015-06-30 LAB — PROTIME-INR
INR: 1.04 (ref <1.50)
PROTHROMBIN TIME: 13.7 s (ref 11.6–15.2)

## 2015-06-30 LAB — BRAIN NATRIURETIC PEPTIDE: Brain Natriuretic Peptide: 41.8 pg/mL (ref <100)

## 2015-06-30 MED ORDER — ISOSORBIDE MONONITRATE ER 60 MG PO TB24: 90.0000 mg | ORAL_TABLET | Freq: Every day | ORAL | Status: DC

## 2015-06-30 NOTE — Progress Notes (Signed)
Cardiology Office Note:    Date:  06/30/2015   ID:  Erik [name], DOB October 08, 1945, MRN [id number]  PCP:  Jonathon Bellows, MD  Cardiologist:  Dr. Casandra Doffing   Electrophysiologist:  n/a  Referring MD: Maurice Small, MD   Chief Complaint  Patient presents with  . Chest Pain    needs cardiac cath arranged    History of Present Illness:     Erik [name] is a 70 y.o. male with a hx of HTN.  He was evaluated in 2014 for fatigue and SOB.  ETT was done and did not demonstrate ischemic changes.  But, he did have angina.  Symptoms improved on nitrates.  Last seen by Dr. Casandra Doffing in 10/16.  He noted recurrent chest pain.  LHC was recommended but the patient was hesitant.  He was started on beta-blocker.  He called in this week with worsening symptoms and Dr. Casandra Doffing recommended proceeding with cardiac cath.  He is brought in for further evaluation prior to his procedure.    The patient notes that he has had gradually worsening symptoms of dyspnea with exertion over the past 6 months. Of note, he had an episode of what sounds like syncope 6 months ago prior to doing yard work. He found himself on the couch diaphoretic with his wife standing over him. He's not had a recurrence. He has occasional chest tightness. This mainly happens when lying flat in bed at night. He has taken nitroglycerin on occasion that seems to help. He notes dyspnea with mild to moderate activities. Denies orthopnea or PND. He has noted some mild edema in his legs. He has a chronic cough without significant change. Denies any bleeding issues.   Past Medical History  Diagnosis Date  . Kidney stone   . Hypertension   . Tinea corporis   . Obesity     Past Surgical History  Procedure Laterality Date  . Total knee arthroplasty Bilateral     Current Medications: Outpatient Prescriptions Prior to Visit  Medication Sig Dispense Refill  . aspirin 81 MG tablet Take 81 mg by mouth daily.    . B Complex Vitamins (VITAMIN  B COMPLEX) TABS Take 1 tablet by mouth daily.     . calcium-vitamin D (OSCAL WITH D) 500-200 MG-UNIT per tablet Take 1 tablet by mouth daily.    . clonazePAM (KLONOPIN) 1 MG tablet Take 1 mg by mouth at bedtime.     . meloxicam (MOBIC) 15 MG tablet Take 15 mg by mouth daily.  0  . metoprolol tartrate (LOPRESSOR) 25 MG tablet Take 1 tablet (25 mg total) by mouth 2 (two) times daily. 180 tablet 2  . Multiple Vitamin (MULITIVITAMIN WITH MINERALS) TABS Take 1 tablet by mouth daily.    . nitroGLYCERIN (NITROSTAT) 0.4 MG SL tablet Place 1 tablet (0.4 mg total) under the tongue every 5 (five) minutes as needed for chest pain. 25 tablet 2  . ramipril (ALTACE) 10 MG capsule Take 1 capsule by mouth  daily 90 capsule 3  . ranitidine (ZANTAC) 150 MG tablet Take 1 tablet (150 mg total) by mouth as needed for heartburn.    . saw palmetto 500 MG capsule Take 500 mg by mouth daily.    . isosorbide mononitrate (IMDUR) 60 MG 24 hr tablet Take 1 tablet by mouth  daily 90 tablet 2   No facility-administered medications prior to visit.     Allergies:   Review of patient's allergies indicates no known allergies.  Social History   Social History  . Marital Status: Married    Spouse Name: N/A  . Number of Children: N/A  . Years of Education: N/A   Social History Main Topics  . Smoking status: Former Research scientist (life sciences)  . Smokeless tobacco: Never Used  . Alcohol Use: None  . Drug Use: None  . Sexual Activity: Not Asked   Other Topics Concern  . None   Social History Narrative     Family History:  The patient's family history includes Heart disease in his mother; Prostate cancer in his father.   ROS:   Please see the history of present illness.    Review of Systems  Constitution: Positive for decreased appetite, malaise/fatigue and weight gain.  HENT: Positive for headaches and hearing loss.   Cardiovascular: Positive for chest pain and dyspnea on exertion.  Respiratory: Positive for cough, shortness of  breath, snoring and wheezing.   Hematologic/Lymphatic: Bruises/bleeds easily.  Musculoskeletal: Positive for myalgias.  Gastrointestinal: Positive for abdominal pain.  Genitourinary: Positive for incomplete emptying.  Neurological: Positive for dizziness and loss of balance.  Psychiatric/Behavioral: The patient is nervous/anxious.   All other systems reviewed and are negative.   Physical Exam:    VS:  BP 122/80 mmHg  Pulse 68  Ht 5\' 9"  (1.753 m)  Wt 279 lb (126.554 kg)  BMI 41.18 kg/m2  SpO2 94%   GEN: Well nourished, well developed, in no acute distress HEENT: normal Neck: no JVD, no masses Cardiac: Normal S1/S2,  RRR; no murmurs, rubs, or gallops, trace-1+ bilat LE edema   Respiratory:  clear to auscultation bilaterally; no wheezing, rhonchi or rales GI: soft, nontender, nondistended MS: no deformity or atrophy Skin: warm and dry Neuro: No focal deficits  Psych: Alert and oriented x 3, normal affect  Wt Readings from Last 3 Encounters:  06/30/15 279 lb (126.554 kg)  01/05/15 255 lb (115.667 kg)  01/03/15 260 lb (117.935 kg)      Studies/Labs Reviewed:     EKG:  EKG is  ordered today.  The ekg ordered today demonstrates NSR, HR 65, Inf Q waves, TWI 3, PRWP, QTc 438 ms  Recent Labs: No results found for requested labs within last 365 days.   Recent Lipid Panel    Component Value Date/Time   CHOL 198 07/05/2009 0957   TRIG 98.0 07/05/2009 0957   HDL 64.50 07/05/2009 0957   CHOLHDL 3 07/05/2009 0957   VLDL 19.6 07/05/2009 0957   LDLCALC 114* 07/05/2009 0957    Additional studies/ records that were reviewed today include:    Echo 10/14 EF 60-65%, no RWMA, mild AI, MAC, mild MR  ETT 9/14 No ST changes   ASSESSMENT:     1. Precordial pain   2. Shortness of breath   3. Essential hypertension, benign   4. Syncope, unspecified syncope type   5. Snoring     PLAN:     In order of problems listed above:  1. Chest pain - The patient has symptoms of  chest discomfort and shortness of breath that sound concerning for angina. Recommendation by Dr. Irish Lack was to proceed with cardiac catheterization. Risks and benefits of cardiac catheterization have been discussed with the patient.  These include bleeding, infection, kidney damage, stroke, heart attack, death.  The patient understands these risks and is willing to proceed.   -  Increase isosorbide to 90 mg daily  -  Continue aspirin, beta blocker  -  Add statin therapy if CAD noted  on cardiac catheterization  -  Proceed with LHC with Dr. Irish Lack 07/13/15.  2. Shortness of breath - As noted suspect angina. However, he has some symptoms that sound concerning for CHF. He does not look particularly volume overloaded on exam. Lungs are clear. Will obtain BNP and echocardiogram.  3. HTN - controlled.  4. Syncope - As noted, he had an episode of what sounds like syncope back 6 months ago. He has not had a recurrence. It could've been heat exhaustion. Will obtain echocardiogram as noted.  5. Snoring - He does have history of exhaustion. If cardiac catheterization is unremarkable or symptoms of exhaustion continue after PCI, consider sleep testing.   Medication Adjustments/Labs and Tests Ordered: Current medicines are reviewed at length with the patient today.  Concerns regarding medicines are outlined above.  Medication changes, Labs and Tests ordered today are outlined in the Patient Instructions noted below. Patient Instructions  Medication Instructions:  1. INCREASE IMDUR TO 90 MG DAILY Labwork: TODAY BMET, CBC W/DIFF, PT/INR, BNP Testing/Procedures: Your physician has requested that you have an echocardiogram. Echocardiography is a painless test that uses sound waves to create images of your heart. It provides your doctor with information about the size and shape of your heart and how well your heart's chambers and valves are working. This procedure takes approximately one hour. There are no  restrictions for this procedure.  Your physician has requested that you have a cardiac catheterization. Cardiac catheterization is used to diagnose and/or treat various heart conditions. Doctors may recommend this procedure for a number of different reasons. The most common reason is to evaluate chest pain. Chest pain can be a symptom of coronary artery disease (CAD), and cardiac catheterization can show whether plaque is narrowing or blocking your heart's arteries. This procedure is also used to evaluate the valves, as well as measure the blood flow and oxygen levels in different parts of your heart. For further information please visit [facility]. Please follow instruction sheet, as given. Follow-Up: DR. VARANASI AS SCHEDULED Any Other Special Instructions Will Be Listed Below (If Applicable). If you need a refill on your cardiac medications before your next appointment, please call your pharmacy.   Signed, Richardson Dopp, PA-C  06/30/2015 1:49 PM    [facility] [address], [place], [place]  [address] Phone: [phone]; Fax: [phone]

## 2015-06-30 NOTE — Patient Instructions (Addendum)
Medication Instructions:  1. INCREASE IMDUR TO 90 MG DAILY Labwork: TODAY BMET, CBC W/DIFF, PT/INR, BNP Testing/Procedures: Your physician has requested that you have an echocardiogram. Echocardiography is a painless test that uses sound waves to create images of your heart. It provides your doctor with information about the size and shape of your heart and how well your heart's chambers and valves are working. This procedure takes approximately one hour. There are no restrictions for this procedure.  Your physician has requested that you have a cardiac catheterization. Cardiac catheterization is used to diagnose and/or treat various heart conditions. Doctors may recommend this procedure for a number of different reasons. The most common reason is to evaluate chest pain. Chest pain can be a symptom of coronary artery disease (CAD), and cardiac catheterization can show whether plaque is narrowing or blocking your heart's arteries. This procedure is also used to evaluate the valves, as well as measure the blood flow and oxygen levels in different parts of your heart. For further information please visit [facility]. Please follow instruction sheet, as given. Follow-Up: DR. VARANASI AS SCHEDULED Any Other Special Instructions Will Be Listed Below (If Applicable). If you need a refill on your cardiac medications before your next appointment, please call your pharmacy.

## 2015-07-03 ENCOUNTER — Telehealth: Payer: Self-pay | Admitting: *Deleted

## 2015-07-03 NOTE — Telephone Encounter (Signed)
Wife notified of lab results by phone with verbal understanding. Need DPR to be filled out at next appt.

## 2015-07-13 ENCOUNTER — Encounter (HOSPITAL_COMMUNITY)
Admission: RE | Disposition: A | Discharge status: Home / Self Care | Payer: Self-pay | Source: Ambulatory Visit | Attending: Interventional Cardiology

## 2015-07-13 ENCOUNTER — Ambulatory Visit (HOSPITAL_COMMUNITY)
Admission: RE | Admit: 2015-07-13 | Discharge: 2015-07-13 | Disposition: A | Discharge status: Home / Self Care | Payer: BLUE CROSS/BLUE SHIELD | Source: Ambulatory Visit | Attending: Interventional Cardiology | Admitting: Interventional Cardiology

## 2015-07-13 DIAGNOSIS — I10 Essential (primary) hypertension: Secondary | ICD-10-CM | POA: Diagnosis not present

## 2015-07-13 DIAGNOSIS — I209 Angina pectoris, unspecified: Secondary | ICD-10-CM | POA: Diagnosis present

## 2015-07-13 DIAGNOSIS — E669 Obesity, unspecified: Secondary | ICD-10-CM | POA: Diagnosis not present

## 2015-07-13 DIAGNOSIS — Z87442 Personal history of urinary calculi: Secondary | ICD-10-CM | POA: Insufficient documentation

## 2015-07-13 DIAGNOSIS — R55 Syncope and collapse: Secondary | ICD-10-CM | POA: Diagnosis not present

## 2015-07-13 DIAGNOSIS — R0602 Shortness of breath: Secondary | ICD-10-CM | POA: Diagnosis not present

## 2015-07-13 DIAGNOSIS — Z6841 Body Mass Index (BMI) 40.0 and over, adult: Secondary | ICD-10-CM | POA: Diagnosis not present

## 2015-07-13 DIAGNOSIS — B354 Tinea corporis: Secondary | ICD-10-CM | POA: Diagnosis not present

## 2015-07-13 DIAGNOSIS — Z8249 Family history of ischemic heart disease and other diseases of the circulatory system: Secondary | ICD-10-CM | POA: Insufficient documentation

## 2015-07-13 DIAGNOSIS — Z7982 Long term (current) use of aspirin: Secondary | ICD-10-CM | POA: Diagnosis not present

## 2015-07-13 DIAGNOSIS — I2582 Chronic total occlusion of coronary artery: Secondary | ICD-10-CM | POA: Diagnosis not present

## 2015-07-13 DIAGNOSIS — I25119 Atherosclerotic heart disease of native coronary artery with unspecified angina pectoris: Secondary | ICD-10-CM | POA: Insufficient documentation

## 2015-07-13 DIAGNOSIS — Z87891 Personal history of nicotine dependence: Secondary | ICD-10-CM | POA: Insufficient documentation

## 2015-07-13 HISTORY — PX: CARDIAC CATHETERIZATION: SHX172

## 2015-07-13 SURGERY — LEFT HEART CATH AND CORONARY ANGIOGRAPHY
Anesthesia: LOCAL

## 2015-07-13 MED ORDER — FENTANYL CITRATE (PF) 100 MCG/2ML IJ SOLN
INTRAMUSCULAR | Status: DC | PRN
  Administered 2015-07-13: 50 ug via INTRAVENOUS

## 2015-07-13 MED ORDER — IOPAMIDOL (ISOVUE-370) INJECTION 76%
INTRAVENOUS | Status: DC | PRN
  Administered 2015-07-13: 130 mL via INTRA_ARTERIAL

## 2015-07-13 MED ORDER — SODIUM CHLORIDE 0.9% FLUSH: 3.0000 mL | INTRAVENOUS | Status: DC | PRN

## 2015-07-13 MED ORDER — MIDAZOLAM HCL 2 MG/2ML IJ SOLN
INTRAMUSCULAR | Status: AC
  Filled 2015-07-13: qty 2

## 2015-07-13 MED ORDER — ASPIRIN 81 MG PO CHEW
CHEWABLE_TABLET | ORAL | Status: AC
  Filled 2015-07-13: qty 1

## 2015-07-13 MED ORDER — FENTANYL CITRATE (PF) 100 MCG/2ML IJ SOLN
INTRAMUSCULAR | Status: AC
  Filled 2015-07-13: qty 2

## 2015-07-13 MED ORDER — SODIUM CHLORIDE 0.9 % WEIGHT BASED INFUSION: 1.0000 mL/kg/h | INTRAVENOUS | Status: DC

## 2015-07-13 MED ORDER — LIDOCAINE HCL (PF) 1 % IJ SOLN
INTRAMUSCULAR | Status: DC | PRN
  Administered 2015-07-13: 2 mL

## 2015-07-13 MED ORDER — HEPARIN (PORCINE) IN NACL 2-0.9 UNIT/ML-% IJ SOLN
INTRAMUSCULAR | Status: AC
  Filled 2015-07-13: qty 1500

## 2015-07-13 MED ORDER — MIDAZOLAM HCL 2 MG/2ML IJ SOLN
INTRAMUSCULAR | Status: DC | PRN
Start: 2015-07-13 — End: 2015-07-13
  Administered 2015-07-13: 2 mg via INTRAVENOUS

## 2015-07-13 MED ORDER — ASPIRIN 81 MG PO CHEW: 81.0000 mg | CHEWABLE_TABLET | ORAL | Status: DC

## 2015-07-13 MED ORDER — SODIUM CHLORIDE 0.9 % WEIGHT BASED INFUSION
3.0000 mL/kg/h | INTRAVENOUS | Status: AC
  Administered 2015-07-13: 3 mL/kg/h via INTRAVENOUS

## 2015-07-13 MED ORDER — SODIUM CHLORIDE 0.9% FLUSH: 3.0000 mL | Freq: Two times a day (BID) | INTRAVENOUS | Status: DC

## 2015-07-13 MED ORDER — HEPARIN (PORCINE) IN NACL 2-0.9 UNIT/ML-% IJ SOLN
INTRAMUSCULAR | Status: DC | PRN
  Administered 2015-07-13: 1500 mL

## 2015-07-13 MED ORDER — NITROGLYCERIN 1 MG/10 ML FOR IR/CATH LAB
INTRA_ARTERIAL | Status: AC
  Filled 2015-07-13: qty 10

## 2015-07-13 MED ORDER — IOPAMIDOL (ISOVUE-370) INJECTION 76%
INTRAVENOUS | Status: AC
  Filled 2015-07-13: qty 100

## 2015-07-13 MED ORDER — VERAPAMIL HCL 2.5 MG/ML IV SOLN
INTRAVENOUS | Status: AC
  Filled 2015-07-13: qty 2

## 2015-07-13 MED ORDER — IOPAMIDOL (ISOVUE-370) INJECTION 76%
INTRAVENOUS | Status: AC
  Filled 2015-07-13: qty 50

## 2015-07-13 MED ORDER — HEPARIN SODIUM (PORCINE) 1000 UNIT/ML IJ SOLN
INTRAMUSCULAR | Status: DC | PRN
  Administered 2015-07-13: 5000 [IU] via INTRAVENOUS

## 2015-07-13 MED ORDER — LIDOCAINE HCL (PF) 1 % IJ SOLN
INTRAMUSCULAR | Status: AC
  Filled 2015-07-13: qty 30

## 2015-07-13 MED ORDER — HEPARIN SODIUM (PORCINE) 1000 UNIT/ML IJ SOLN
INTRAMUSCULAR | Status: AC
  Filled 2015-07-13: qty 1

## 2015-07-13 MED ORDER — SODIUM CHLORIDE 0.9 % IV SOLN: 250.0000 mL | INTRAVENOUS | Status: DC | PRN

## 2015-07-13 SURGICAL SUPPLY — 18 items
CATH INFINITI 5 FR JL3.5 (CATHETERS) ×2 IMPLANT
CATH INFINITI 5FR AL1 (CATHETERS) ×2 IMPLANT
CATH INFINITI JR4 5F (CATHETERS) ×2 IMPLANT
CATH LAUNCHER 5F AL1 (CATHETERS) ×1 IMPLANT
CATH LAUNCHER 5F EBU3.0 (CATHETERS) ×1 IMPLANT
CATH LAUNCHER 5F NOTO (CATHETERS) ×1 IMPLANT
CATHETER LAUNCHER 5F AL1 (CATHETERS) ×2
CATHETER LAUNCHER 5F EBU3.0 (CATHETERS) ×2
CATHETER LAUNCHER 5F NOTO (CATHETERS) ×2
DEVICE RAD COMP TR BAND LRG (VASCULAR PRODUCTS) ×2 IMPLANT
GLIDESHEATH SLEND SS 6F .021 (SHEATH) ×2 IMPLANT
KIT HEART LEFT (KITS) ×2 IMPLANT
PACK CARDIAC CATHETERIZATION (CUSTOM PROCEDURE TRAY) ×2 IMPLANT
SHEATH PINNACLE 5F 10CM (SHEATH) IMPLANT
TRANSDUCER W/STOPCOCK (MISCELLANEOUS) ×2 IMPLANT
TUBING CIL FLEX 10 FLL-RA (TUBING) ×2 IMPLANT
WIRE HI TORQ VERSACORE-J 145CM (WIRE) ×2 IMPLANT
WIRE SAFE-T 1.5MM-J .035X260CM (WIRE) ×2 IMPLANT

## 2015-07-13 NOTE — Progress Notes (Signed)
Pt discharge instruction given to pt and family and they were able to verbalize understanding.  Pt denies any discomfort at this time.  Pt to car via wheelchair. With instructions.

## 2015-07-13 NOTE — Discharge Instructions (Signed)
Radial Site Care °Refer to this sheet in the next few weeks. These instructions provide you with information about caring for yourself after your procedure. Your health care provider may also give you more specific instructions. Your treatment has been planned according to current medical practices, but problems sometimes occur. Call your health care provider if you have any problems or questions after your procedure. °WHAT TO EXPECT AFTER THE PROCEDURE °After your procedure, it is typical to have the following: °· Bruising at the radial site that usually fades within 1-2 weeks. °· Blood collecting in the tissue (hematoma) that may be painful to the touch. It should usually decrease in size and tenderness within 1-2 weeks. °HOME CARE INSTRUCTIONS °· Take medicines only as directed by your health care provider. °· You may shower 24-48 hours after the procedure or as directed by your health care provider. Remove the bandage (dressing) and gently wash the site with plain soap and water. Pat the area dry with a clean towel. Do not rub the site, because this may cause bleeding. °· Do not take baths, swim, or use a hot tub until your health care provider approves. °· Check your insertion site every day for redness, swelling, or drainage. °· Do not apply powder or lotion to the site. °· Do not flex or bend the affected arm for 24 hours or as directed by your health care provider. °· Do not push or pull heavy objects with the affected arm for 24 hours or as directed by your health care provider. °· Do not lift over 10 lb (4.5 kg) for 5 days after your procedure or as directed by your health care provider. °· Ask your health care provider when it is okay to: °¨ Return to work or school. °¨ Resume usual physical activities or sports. °¨ Resume sexual activity. °· Do not drive home if you are discharged the same day as the procedure. Have someone else drive you. °· You may drive 24 hours after the procedure unless otherwise  instructed by your health care provider. °· Do not operate machinery or power tools for 24 hours after the procedure. °· If your procedure was done as an outpatient procedure, which means that you went home the same day as your procedure, a responsible adult should be with you for the first 24 hours after you arrive home. °· Keep all follow-up visits as directed by your health care provider. This is important. °SEEK MEDICAL CARE IF: °· You have a fever. °· You have chills. °· You have increased bleeding from the radial site. Hold pressure on the site. °SEEK IMMEDIATE MEDICAL CARE IF: °· You have unusual pain at the radial site. °· You have redness, warmth, or swelling at the radial site. °· You have drainage (other than a small amount of blood on the dressing) from the radial site. °· The radial site is bleeding, and the bleeding does not stop after 30 minutes of holding steady pressure on the site. °· Your arm or hand becomes pale, cool, tingly, or numb. °  °This information is not intended to replace advice given to you by your health care provider. Make sure you discuss any questions you have with your health care provider. °  °Document Released: 04/13/2010 Document Revised: 04/01/2014 Document Reviewed: 09/27/2013 °Elsevier Interactive Patient Education ©2016 [employer] ° °

## 2015-07-13 NOTE — Progress Notes (Signed)
TR BAND REMOVAL  LOCATION:    Right radial   DEFLATED PER PROTOCOL:    Yes.    TIME BAND OFF / DRESSING APPLIED:   1300p  SITE UPON ARRIVAL:    Level 0  SITE AFTER BAND REMOVAL:    Level 0  CIRCULATION SENSATION AND MOVEMENT:    Within Normal Limits   Yes.    COMMENTS:   Pt denies any discomfort at site.  Pulses +2, Normal skin tone

## 2015-07-13 NOTE — Interval H&P Note (Signed)
Cath Lab Visit (complete for each Cath Lab visit)  Clinical Evaluation Leading to the Procedure:   ACS: No.  Non-ACS:    Anginal Classification: CCS III  Anti-ischemic medical therapy: Maximal Therapy (2 or more classes of medications)  Non-Invasive Test Results: No non-invasive testing performed  Prior CABG: No previous CABG      History and Physical Interval Note:  07/13/2015 9:46 AM  Erik [name]  has presented today for surgery, with the diagnosis of abn ekg  and [place]  The various methods of treatment have been discussed with the patient and family. After consideration of risks, benefits and other options for treatment, the patient has consented to  Procedure(s): Left Heart Cath and Coronary Angiography (N/A) as a surgical intervention .  The patient's history has been reviewed, patient examined, no change in status, stable for surgery.  I have reviewed the patient's chart and labs.  Questions were answered to the patient's satisfaction.     Erik [name] S.

## 2015-07-13 NOTE — H&P (View-Only) (Signed)
Cardiology Office Note:    Date:  06/30/2015   ID:  [name], DOB 06/09/1945, MRN [id number]  PCP:  Jonathon Bellows, MD  Cardiologist:  Dr. Casandra Doffing   Electrophysiologist:  n/a  Referring MD: Maurice Small, MD   Chief Complaint  Patient presents with  . Chest Pain    needs cardiac cath arranged    History of Present Illness:     Erik [name] is a 70 y.o. male with a hx of HTN.  He was evaluated in 2014 for fatigue and SOB.  ETT was done and did not demonstrate ischemic changes.  But, he did have angina.  Symptoms improved on nitrates.  Last seen by Dr. Casandra Doffing in 10/16.  He noted recurrent chest pain.  LHC was recommended but the patient was hesitant.  He was started on beta-blocker.  He called in this week with worsening symptoms and Dr. Casandra Doffing recommended proceeding with cardiac cath.  He is brought in for further evaluation prior to his procedure.    The patient notes that he has had gradually worsening symptoms of dyspnea with exertion over the past 6 months. Of note, he had an episode of what sounds like syncope 6 months ago prior to doing yard work. He found himself on the couch diaphoretic with his wife standing over him. He's not had a recurrence. He has occasional chest tightness. This mainly happens when lying flat in bed at night. He has taken nitroglycerin on occasion that seems to help. He notes dyspnea with mild to moderate activities. Denies orthopnea or PND. He has noted some mild edema in his legs. He has a chronic cough without significant change. Denies any bleeding issues.   Past Medical History  Diagnosis Date  . Kidney stone   . Hypertension   . Tinea corporis   . Obesity     Past Surgical History  Procedure Laterality Date  . Total knee arthroplasty Bilateral     Current Medications: Outpatient Prescriptions Prior to Visit  Medication Sig Dispense Refill  . aspirin 81 MG tablet Take 81 mg by mouth daily.    . B Complex Vitamins (VITAMIN  B COMPLEX) TABS Take 1 tablet by mouth daily.     . calcium-vitamin D (OSCAL WITH D) 500-200 MG-UNIT per tablet Take 1 tablet by mouth daily.    . clonazePAM (KLONOPIN) 1 MG tablet Take 1 mg by mouth at bedtime.     . meloxicam (MOBIC) 15 MG tablet Take 15 mg by mouth daily.  0  . metoprolol tartrate (LOPRESSOR) 25 MG tablet Take 1 tablet (25 mg total) by mouth 2 (two) times daily. 180 tablet 2  . Multiple Vitamin (MULITIVITAMIN WITH MINERALS) TABS Take 1 tablet by mouth daily.    . nitroGLYCERIN (NITROSTAT) 0.4 MG SL tablet Place 1 tablet (0.4 mg total) under the tongue every 5 (five) minutes as needed for chest pain. 25 tablet 2  . ramipril (ALTACE) 10 MG capsule Take 1 capsule by mouth  daily 90 capsule 3  . ranitidine (ZANTAC) 150 MG tablet Take 1 tablet (150 mg total) by mouth as needed for heartburn.    . saw palmetto 500 MG capsule Take 500 mg by mouth daily.    . isosorbide mononitrate (IMDUR) 60 MG 24 hr tablet Take 1 tablet by mouth  daily 90 tablet 2   No facility-administered medications prior to visit.     Allergies:   Review of patient's allergies indicates no known allergies.  Social History   Social History  . Marital Status: Married    Spouse Name: N/A  . Number of Children: N/A  . Years of Education: N/A   Social History Main Topics  . Smoking status: Former Research scientist (life sciences)  . Smokeless tobacco: Never Used  . Alcohol Use: None  . Drug Use: None  . Sexual Activity: Not Asked   Other Topics Concern  . None   Social History Narrative     Family History:  The patient's family history includes Heart disease in his mother; Prostate cancer in his father.   ROS:   Please see the history of present illness.    Review of Systems  Constitution: Positive for decreased appetite, malaise/fatigue and weight gain.  HENT: Positive for headaches and hearing loss.   Cardiovascular: Positive for chest pain and dyspnea on exertion.  Respiratory: Positive for cough, shortness of  breath, snoring and wheezing.   Hematologic/Lymphatic: Bruises/bleeds easily.  Musculoskeletal: Positive for myalgias.  Gastrointestinal: Positive for abdominal pain.  Genitourinary: Positive for incomplete emptying.  Neurological: Positive for dizziness and loss of balance.  Psychiatric/Behavioral: The patient is nervous/anxious.   All other systems reviewed and are negative.   Physical Exam:    VS:  BP 122/80 mmHg  Pulse 68  Ht 5\' 9"  (1.753 m)  Wt 279 lb (126.554 kg)  BMI 41.18 kg/m2  SpO2 94%   GEN: Well nourished, well developed, in no acute distress HEENT: normal Neck: no JVD, no masses Cardiac: Normal S1/S2,  RRR; no murmurs, rubs, or gallops, trace-1+ bilat LE edema   Respiratory:  clear to auscultation bilaterally; no wheezing, rhonchi or rales GI: soft, nontender, nondistended MS: no deformity or atrophy Skin: warm and dry Neuro: No focal deficits  Psych: Alert and oriented x 3, normal affect  Wt Readings from Last 3 Encounters:  06/30/15 279 lb (126.554 kg)  01/05/15 255 lb (115.667 kg)  01/03/15 260 lb (117.935 kg)      Studies/Labs Reviewed:     EKG:  EKG is  ordered today.  The ekg ordered today demonstrates NSR, HR 65, Inf Q waves, TWI 3, PRWP, QTc 438 ms  Recent Labs: No results found for requested labs within last 365 days.   Recent Lipid Panel    Component Value Date/Time   CHOL 198 07/05/2009 0957   TRIG 98.0 07/05/2009 0957   HDL 64.50 07/05/2009 0957   CHOLHDL 3 07/05/2009 0957   VLDL 19.6 07/05/2009 0957   LDLCALC 114* 07/05/2009 0957    Additional studies/ records that were reviewed today include:    Echo 10/14 EF 60-65%, no RWMA, mild AI, MAC, mild MR  ETT 9/14 No ST changes   ASSESSMENT:     1. Precordial pain   2. Shortness of breath   3. Essential hypertension, benign   4. Syncope, unspecified syncope type   5. Snoring     PLAN:     In order of problems listed above:  1. Chest pain - The patient has symptoms of  chest discomfort and shortness of breath that sound concerning for angina. Recommendation by Dr. Irish Lack was to proceed with cardiac catheterization. Risks and benefits of cardiac catheterization have been discussed with the patient.  These include bleeding, infection, kidney damage, stroke, heart attack, death.  The patient understands these risks and is willing to proceed.   -  Increase isosorbide to 90 mg daily  -  Continue aspirin, beta blocker  -  Add statin therapy if CAD noted  on cardiac catheterization  -  Proceed with LHC with Dr. Irish Lack 07/13/15.  2. Shortness of breath - As noted suspect angina. However, he has some symptoms that sound concerning for CHF. He does not look particularly volume overloaded on exam. Lungs are clear. Will obtain BNP and echocardiogram.  3. HTN - controlled.  4. Syncope - As noted, he had an episode of what sounds like syncope back 6 months ago. He has not had a recurrence. It could've been heat exhaustion. Will obtain echocardiogram as noted.  5. Snoring - He does have history of exhaustion. If cardiac catheterization is unremarkable or symptoms of exhaustion continue after PCI, consider sleep testing.   Medication Adjustments/Labs and Tests Ordered: Current medicines are reviewed at length with the patient today.  Concerns regarding medicines are outlined above.  Medication changes, Labs and Tests ordered today are outlined in the Patient Instructions noted below. Patient Instructions  Medication Instructions:  1. INCREASE IMDUR TO 90 MG DAILY Labwork: TODAY BMET, CBC W/DIFF, PT/INR, BNP Testing/Procedures: Your physician has requested that you have an echocardiogram. Echocardiography is a painless test that uses sound waves to create images of your heart. It provides your doctor with information about the size and shape of your heart and how well your heart's chambers and valves are working. This procedure takes approximately one hour. There are no  restrictions for this procedure.  Your physician has requested that you have a cardiac catheterization. Cardiac catheterization is used to diagnose and/or treat various heart conditions. Doctors may recommend this procedure for a number of different reasons. The most common reason is to evaluate chest pain. Chest pain can be a symptom of coronary artery disease (CAD), and cardiac catheterization can show whether plaque is narrowing or blocking your heart's arteries. This procedure is also used to evaluate the valves, as well as measure the blood flow and oxygen levels in different parts of your heart. For further information please visit [facility]. Please follow instruction sheet, as given. Follow-Up: DR. VARANASI AS SCHEDULED Any Other Special Instructions Will Be Listed Below (If Applicable). If you need a refill on your cardiac medications before your next appointment, please call your pharmacy.   Signed, Richardson Dopp, PA-C  06/30/2015 1:49 PM    [facility] [place], [place], [place]  [address] Phone: [phone]; Fax: [phone]

## 2015-07-13 NOTE — Research (Signed)
[facility] Consent   Subject Name: Azlaan Isidore  Subject met inclusion and exclusion criteria.  The informed consent form, study requirements and expectations were reviewed with the subject and questions and concerns were addressed prior to the signing of the consent form.  The subject verbalized understanding of the trial requirements.  The subject agreed to participate in the [name]  trial and signed the informed consent.  The informed consent was obtained prior to performance of any protocol-specific procedures for the subject.  A copy of the signed informed consent was given to the subject and a copy was placed in the subject's medical record.  Marlana Salvage 07/13/2015, 08:20 AM

## 2015-07-14 ENCOUNTER — Encounter (HOSPITAL_COMMUNITY): Payer: Self-pay | Admitting: Interventional Cardiology

## 2015-07-17 ENCOUNTER — Telehealth: Payer: Self-pay | Admitting: Interventional Cardiology

## 2015-07-17 NOTE — Telephone Encounter (Signed)
He should still do the echo.  We were unable to do a ventriculogram during the cath because of the tortuosity in the vessels in his right shoulder.

## 2015-07-17 NOTE — Telephone Encounter (Signed)
Pt states that he feels a little better during the day with the Isosorbide but he is still having CP at night. Pt has taken 4-5 Nitro since heart cath. Pt was walking dog yesterday and said he only walks him half a block and then brings him back.  Pt had to stop because he became very SOB and was having some pain.  Pt took 1/2 of a Hydrocodone and said after about 10 mins, sx resolved and he went home.  Pt states he was fine after that and actually slept very well last night. Pt states that his wife said that Dr. Irish Lack had mentioned he wanted to put pt "on meds that will help reduce and break down the blockages". Advised pt that I will route message to Dr. Irish Lack for review. Pt very eager to see Dr. Irish Lack.

## 2015-07-17 NOTE — Telephone Encounter (Addendum)
Spoke with pt's wife and she states that pt is wanting to know if he even needs to have the echo done on 4/26 since he had a cath on 4/20?  Echo was ordered by Richardson Dopp, PA-C at [facility] on 4/7 along with the cath.  Pt then called and stated that he was confused as well on whether he is suppose to get an echo or see Dr. Clayton Bibles.  Pt states when they called to make the original appt that his understanding was that he was going to see Dr. Clayton Bibles, not get an echo.  Pt also states that Dr. Irish Lack had mentioned starting some medications when he was at the hospital for cath so he would like to see Dr. Irish Lack sooner than his May appt.  Advised I am going to route this message to Dr. Irish Lack for review and advisement on whether to keep appt for echo?

## 2015-07-17 NOTE — Telephone Encounter (Signed)
Is he feeling better after the increase of isosorbide to 60 mg?

## 2015-07-17 NOTE — Telephone Encounter (Signed)
New message  Pt states that he was advised to see Dr. Irish Lack on 07/19/2014 verses having an ECHo. He states that he was advised per Dr. Irish Lack to come into his office to discuss changing medications and this appt is not supposed to be for and ECHO. Pt declined to cancel ECHO and request a call back from the nurse to determine which appt is needed at this time

## 2015-07-17 NOTE — Telephone Encounter (Signed)
Pt is calling back

## 2015-07-17 NOTE — Telephone Encounter (Signed)
Left message to call back  

## 2015-07-18 ENCOUNTER — Telehealth (HOSPITAL_COMMUNITY): Payer: Self-pay | Admitting: Radiology

## 2015-07-18 NOTE — Telephone Encounter (Signed)
Unable to leave a message to confirm echo appointment.

## 2015-07-18 NOTE — Telephone Encounter (Signed)
pt confirmed appt- 4/26 @ 9:45am

## 2015-07-19 ENCOUNTER — Other Ambulatory Visit (HOSPITAL_COMMUNITY): Payer: BLUE CROSS/BLUE SHIELD

## 2015-07-19 ENCOUNTER — Ambulatory Visit (HOSPITAL_COMMUNITY): Payer: BLUE CROSS/BLUE SHIELD | Attending: Cardiovascular Disease

## 2015-07-19 ENCOUNTER — Encounter: Payer: Self-pay | Admitting: Interventional Cardiology

## 2015-07-19 ENCOUNTER — Other Ambulatory Visit: Payer: Self-pay

## 2015-07-19 ENCOUNTER — Ambulatory Visit (INDEPENDENT_AMBULATORY_CARE_PROVIDER_SITE_OTHER): Payer: BLUE CROSS/BLUE SHIELD | Admitting: Interventional Cardiology

## 2015-07-19 ENCOUNTER — Telehealth: Payer: Self-pay | Admitting: *Deleted

## 2015-07-19 VITALS — BP 112/68 | HR 56 | Ht 71.0 in | Wt 270.0 lb

## 2015-07-19 DIAGNOSIS — I351 Nonrheumatic aortic (valve) insufficiency: Secondary | ICD-10-CM | POA: Insufficient documentation

## 2015-07-19 DIAGNOSIS — E669 Obesity, unspecified: Secondary | ICD-10-CM | POA: Insufficient documentation

## 2015-07-19 DIAGNOSIS — I340 Nonrheumatic mitral (valve) insufficiency: Secondary | ICD-10-CM | POA: Insufficient documentation

## 2015-07-19 DIAGNOSIS — R0602 Shortness of breath: Secondary | ICD-10-CM | POA: Insufficient documentation

## 2015-07-19 DIAGNOSIS — E785 Hyperlipidemia, unspecified: Secondary | ICD-10-CM | POA: Diagnosis not present

## 2015-07-19 DIAGNOSIS — I119 Hypertensive heart disease without heart failure: Secondary | ICD-10-CM | POA: Insufficient documentation

## 2015-07-19 DIAGNOSIS — I208 Other forms of angina pectoris: Secondary | ICD-10-CM | POA: Diagnosis not present

## 2015-07-19 DIAGNOSIS — Z6837 Body mass index (BMI) 37.0-37.9, adult: Secondary | ICD-10-CM | POA: Diagnosis not present

## 2015-07-19 DIAGNOSIS — I10 Essential (primary) hypertension: Secondary | ICD-10-CM | POA: Diagnosis not present

## 2015-07-19 LAB — ECHOCARDIOGRAM COMPLETE
Height: 71 in
WEIGHTICAEL: 4320 [oz_av]

## 2015-07-19 MED ORDER — ATORVASTATIN CALCIUM 10 MG PO TABS: 10.0000 mg | ORAL_TABLET | Freq: Every day | ORAL | Status: DC

## 2015-07-19 NOTE — Progress Notes (Signed)
Patient ID: Erik [name], male   DOB: [birth year], 69 y.o.   MRN: [id number]     Cardiology Office Note   Date:  07/19/2015   ID:  Erik [name], DOB 10-28-1945, MRN [id number]  PCP:  Erik Bellows, MD    Chief Complaint  Patient presents with  . Chest Pain    Chest tightness  . Shortness of Breath    getting better. feels SOB with any type activity  Follow-up coronary artery disease   Wt Readings from Last 3 Encounters:  07/19/15 270 lb (122.471 kg)  07/13/15 265 lb (120.203 kg)  06/30/15 279 lb (126.554 kg)       History of Present Illness: Erik [name] is a 70 y.o. male  who had progressive anginal symptoms. He underwent cardiac catheterization in April 2017 which revealed a focal lesion in the circumflex. There was a chronic total occlusion of the RCA with left to right collaterals. Due to the lack of LAD disease noted, he was not referred for bypass and the plan was for medical therapy. He returns today for follow-up.  Of note, there is difficulty completing the heart cath from his right radial approach. He had no problems with the right radial site post cath. He continues to have some anginal symptoms although they have improved with increasing medical therapy. It was noted at the time of cath that his RCA is not a great candidate for percutaneous intervention of the CTO because there are multiple branch vessels which potentially would be lost, along this long chronic total occlusion.  Isosorbide was increased a week ago. In the last 2 days, he has felt better. No nitroglycerin use. No chest discomfort. He has returned back to work.    Past Medical History  Diagnosis Date  . Kidney stone   . Hypertension   . Tinea corporis   . Obesity     Past Surgical History  Procedure Laterality Date  . Total knee arthroplasty Bilateral   . Cardiac catheterization N/A 07/13/2015    Procedure: Left Heart Cath and Coronary Angiography;  Surgeon: Jettie Booze, MD;  Location:  [facility];  Service: Cardiovascular;  Laterality: N/A;     Current Outpatient Prescriptions  Medication Sig Dispense Refill  . aspirin 81 MG tablet Take 81 mg by mouth daily.    . B Complex Vitamins (VITAMIN B COMPLEX) TABS Take 1 tablet by mouth daily.     . Calcium Citrate-Vitamin D (CALCIUM + D PO) Take 1 capsule by mouth as directed. Takes 600 mg as directed    . calcium-vitamin D (OSCAL WITH D) 500-200 MG-UNIT per tablet Take 1 tablet by mouth daily.    . Cholecalciferol (VITAMIN D3) 5000 units CAPS Take 1 capsule by mouth as directed.    . clonazePAM (KLONOPIN) 1 MG tablet Take 1 mg by mouth at bedtime as needed (for sleep).     . diphenhydrAMINE (SOMINEX) 25 MG tablet Take 25 mg by mouth at bedtime as needed for sleep.    . fexofenadine (ALLEGRA ALLERGY) 180 MG tablet Take 180 mg by mouth as directed.    Marland Kitchen FISH OIL-KRILL OIL PO Take 1 tablet by mouth as directed. 1250 mg by mouth daily    . Flaxseed, Linseed, (FLAXSEED OIL) 1000 MG CAPS Take 1 capsule by mouth as directed.    . isosorbide mononitrate (IMDUR) 60 MG 24 hr tablet Take 1.5 tablets (90 mg total) by mouth daily. 135 tablet 11  . meloxicam (MOBIC) 15  MG tablet Take 15 mg by mouth daily.  0  . metoprolol tartrate (LOPRESSOR) 25 MG tablet Take 1 tablet (25 mg total) by mouth 2 (two) times daily. 180 tablet 2  . Multiple Vitamin (MULITIVITAMIN WITH MINERALS) TABS Take 1 tablet by mouth daily.    . nitroGLYCERIN (NITROSTAT) 0.4 MG SL tablet Place 0.4 mg under the tongue every 5 (five) minutes as needed for chest pain (up to 3 doses in 24 hours).    Marland Kitchen omeprazole (PRILOSEC) 40 MG capsule Take 40 mg by mouth daily.    . ramipril (ALTACE) 10 MG capsule Take 10 mg by mouth daily.    . saw palmetto 500 MG capsule Take 500 mg by mouth daily.    . Selenium 200 MCG CAPS Take 1 capsule by mouth as directed.    . tetrahydrozoline-zinc (VISINE-AC) 0.05-0.25 % ophthalmic solution Place 1 drop into both eyes at bedtime as needed (for  itchy allergy eyes).    . Ubiquinol 100 MG CAPS Take 1 tablet by mouth as directed.    Marland Kitchen atorvastatin (LIPITOR) 10 MG tablet Take 1 tablet (10 mg total) by mouth daily. 90 tablet 3   No current facility-administered medications for this visit.    Allergies:   Review of patient's allergies indicates no known allergies.    Social History:  The patient  reports that he has quit smoking. He has never used smokeless tobacco.   Family History:  The patient's family history includes Heart disease in his mother; Prostate cancer in his father. There is no history of Heart attack, Stroke, or Hypertension.    ROS:  Please see the history of present illness.   Otherwise, review of systems are positive for chest pain, improving, no NTG use in the last 2 days.   All other systems are reviewed and negative.    PHYSICAL EXAM: VS:  BP 112/68 mmHg  Pulse 56  Ht 5' 11"  (1.803 m)  Wt 270 lb (122.471 kg)  BMI 37.67 kg/m2  SpO2 97% , BMI Body mass index is 37.67 kg/(m^2). GEN: Well nourished, well developed, in no acute distress HEENT: normal Neck: no JVD, carotid bruits, or masses Cardiac: RRR; no murmurs, rubs, or gallops,no edema  Respiratory:  clear to auscultation bilaterally, normal work of breathing GI: soft, nontender, nondistended, + BS MS: no deformity or atrophy Skin: warm and dry, no rash Neuro:  Strength and sensation are intact Psych: euthymic mood, full affect    Recent Labs: 06/30/2015: BUN 12; Creat 1.10; Hemoglobin 12.9*; Platelets 338; Potassium 4.9; Sodium 139   Lipid Panel    Component Value Date/Time   CHOL 198 07/05/2009 0957   TRIG 98.0 07/05/2009 0957   HDL 64.50 07/05/2009 0957   CHOLHDL 3 07/05/2009 0957   VLDL 19.6 07/05/2009 0957   LDLCALC 114* 07/05/2009 0957     Other studies Reviewed: Additional studies/ records that were reviewed today with results demonstrating: cath report reviewed.   ASSESSMENT AND PLAN:  1. Coronary artery disease: Multivessel.  No clear significant LAD disease. There is moderate disease in the mid vessel. There is a tight diagonal lesion. The circumflex lesion may be treatable with stent. The right coronary artery is not favorable for PCI. I presented the options to the patient and his wife. They include continued medical therapy alone; could consider adding Ranexa or amlodipine if blood pressure tolerates. The second option would be repeat cardiac cath with FFR of the LAD. If the FFR of the LAD was negative,  we would plan to treat the circumflex percutaneously and then continue medical therapy in the hopes of reducing angina.  The third option would be referral to cardiac surgeon for possible bypass surgery, although this would likely not involved bypass of the LAD, and thus would be purely for symptom relief and not for any survival benefit.  He is comfortable with medical therapy at this time.  If Symptoms get worse, we would reconsider repeat catheterization with goal or PCI of the circumflex, FFR of the LAD. 2. Hyperlipidemia: Given his LDL above target, will start atorvastatin 10 mg daily. He'll need lipids and liver function checked in a couple of months. 3. Hypertension: Continue ACE inhibitor.  Controlled.   Current medicines are reviewed at length with the patient today.  The patient concerns regarding his medicines were addressed.  The following changes have been made:  Start atorvastatin 10 mg daily  Labs/ tests ordered today include: in one month  Orders Placed This Encounter  Procedures  . Comp Met (CMET)  . Lipid Profile    Recommend 150 minutes/week of aerobic exercise Low fat, low carb, high fiber diet recommended  Disposition:   FU in 1 month as scheduled   Teresita Madura., MD  07/19/2015 10:45 AM    [facility] [place], [place], [place]  [address] Phone: [phone]; Fax: [phone]

## 2015-07-19 NOTE — Telephone Encounter (Signed)
DPR on file for wife who has been notified of pt's echo results by phone with verbal understanding. I asked if she had any questions, wife stated no not at this time.

## 2015-07-19 NOTE — Patient Instructions (Signed)
Medication Instructions:  Start taking Atorvastatin 10 mg daily-all other medications remain the same    Labwork: CMET and Lipids at your next office visit on 08/15/15-please do not eat or drink after midnight the night before labs are drawn  Testing/Procedures: None  Follow-Up: As already planned     If you need a refill on your cardiac medications before your next appointment, please call your pharmacy.

## 2015-08-14 DIAGNOSIS — I25118 Atherosclerotic heart disease of native coronary artery with other forms of angina pectoris: Secondary | ICD-10-CM | POA: Insufficient documentation

## 2015-08-14 NOTE — Progress Notes (Signed)
Patient ID: Erik [name], male   DOB: 04/07/1945, 70 y.o.   MRN: [id number]     Cardiology Office Note   Date:  08/15/2015   ID:  Erik [name], DOB [birth year], MRN [id number]  PCP:  Jonathon Bellows, MD    No chief complaint on file. Follow-up coronary artery disease   Wt Readings from Last 3 Encounters:  08/15/15 270 lb (122.471 kg)  07/19/15 270 lb (122.471 kg)  07/13/15 265 lb (120.203 kg)       History of Present Illness: Erik [name] is a 70 y.o. male  who had progressive anginal symptoms. He underwent cardiac catheterization in April 2017 which revealed a focal lesion in the circumflex. There was a chronic total occlusion of the RCA with left to right collaterals. Due to the lack of LAD disease noted, he was not referred for bypass and the plan was for medical therapy. He returns today for follow-up.  Of note, there is difficulty completing the heart cath from his right radial approach. He had no problems with the right radial site post cath. He continues to have some anginal symptoms although they have improved with increasing medical therapy. It was noted at the time of cath that his RCA is not a great candidate for percutaneous intervention of the CTO because there are multiple branch vessels which potentially would be lost, along this long chronic total occlusion.  Isosorbide was increased a week ago. In the last few weeks, he has felt better in terms of chest pain. No nitroglycerin use. No chest discomfort. He has returned back to work.  However, he has more SHOB.  He has to rest after a few minutes of walking.  He stops walking now while walking his dog.  He gets too tired. He has trouble taking the trash can out.     Past Medical History  Diagnosis Date  . Kidney stone   . Hypertension   . Tinea corporis   . Obesity   . History of echocardiogram     Echo 4/17 - mild concentric LVH, EF 55-60%, no RWMA, Gr 1 DD, mild AI, mild MR, mild LAE    Past Surgical History    Procedure Laterality Date  . Total knee arthroplasty Bilateral   . Cardiac catheterization N/A 07/13/2015    Procedure: Left Heart Cath and Coronary Angiography;  Surgeon: Jettie Booze, MD;  Location: [facility];  Service: Cardiovascular;  Laterality: N/A;     Current Outpatient Prescriptions  Medication Sig Dispense Refill  . aspirin 81 MG tablet Take 81 mg by mouth daily.    Marland Kitchen atorvastatin (LIPITOR) 10 MG tablet Take 1 tablet (10 mg total) by mouth daily. 90 tablet 3  . B Complex Vitamins (VITAMIN B COMPLEX) TABS Take 1 tablet by mouth daily.     . Calcium Citrate-Vitamin D (CALCIUM + D PO) Take 1 capsule by mouth as directed. Takes 600 mg as directed    . calcium-vitamin D (OSCAL WITH D) 500-200 MG-UNIT per tablet Take 1 tablet by mouth daily.    . Cholecalciferol (VITAMIN D3) 5000 units CAPS Take 1 capsule by mouth as directed.    . clonazePAM (KLONOPIN) 1 MG tablet Take 1 mg by mouth at bedtime as needed (for sleep).     . diphenhydrAMINE (SOMINEX) 25 MG tablet Take 25 mg by mouth at bedtime as needed for sleep.    . fexofenadine (ALLEGRA ALLERGY) 180 MG tablet Take 180 mg by mouth as directed.    Marland Kitchen  FISH OIL-KRILL OIL PO Take 1 tablet by mouth as directed. 1250 mg by mouth daily    . Flaxseed, Linseed, (FLAXSEED OIL) 1000 MG CAPS Take 1 capsule by mouth as directed.    . isosorbide mononitrate (IMDUR) 60 MG 24 hr tablet Take 1.5 tablets (90 mg total) by mouth daily. 135 tablet 11  . meloxicam (MOBIC) 15 MG tablet Take 15 mg by mouth daily.  0  . metoprolol tartrate (LOPRESSOR) 25 MG tablet Take 1 tablet (25 mg total) by mouth 2 (two) times daily. 180 tablet 2  . Multiple Vitamin (MULITIVITAMIN WITH MINERALS) TABS Take 1 tablet by mouth daily.    . nitroGLYCERIN (NITROSTAT) 0.4 MG SL tablet Place 0.4 mg under the tongue every 5 (five) minutes as needed for chest pain (up to 3 doses in 24 hours).    Marland Kitchen omeprazole (PRILOSEC) 40 MG capsule Take 40 mg by mouth daily.    .  ramipril (ALTACE) 10 MG capsule Take 10 mg by mouth daily.    . saw palmetto 500 MG capsule Take 500 mg by mouth daily.    . Selenium 200 MCG CAPS Take 1 capsule by mouth as directed.    . tetrahydrozoline-zinc (VISINE-AC) 0.05-0.25 % ophthalmic solution Place 1 drop into both eyes at bedtime as needed (for itchy allergy eyes).    . Ubiquinol 100 MG CAPS Take 1 tablet by mouth as directed.     No current facility-administered medications for this visit.    Allergies:   Review of patient's allergies indicates no known allergies.    Social History:  The patient  reports that he has quit smoking. He has never used smokeless tobacco.   Family History:  The patient's family history includes Heart disease in his mother; Prostate cancer in his father. There is no history of Heart attack, Stroke, or Hypertension.    ROS:  Please see the history of present illness.   Otherwise, review of systems are positive for chest pain, improving, no NTG use in the last 2 days.   All other systems are reviewed and negative.    PHYSICAL EXAM: VS:  BP 150/92 mmHg  Pulse 68  Ht 5\' 11"  (1.803 m)  Wt 270 lb (122.471 kg)  BMI 37.67 kg/m2 , BMI Body mass index is 37.67 kg/(m^2). GEN: Well nourished, well developed, in no acute distress HEENT: normal Neck: no JVD, carotid bruits, or masses Cardiac: RRR; no murmurs, rubs, or gallops,no edema  Respiratory:  clear to auscultation bilaterally, normal work of breathing GI: soft, nontender, nondistended, + BS MS: no deformity or atrophy Skin: warm and dry, no rash Neuro:  Strength and sensation are intact Psych: euthymic mood, full affect    Recent Labs: 06/30/2015: Brain Natriuretic Peptide 41.8; BUN 12; Creat 1.10; Hemoglobin 12.9*; Platelets 338; Potassium 4.9; Sodium 139   Lipid Panel    Component Value Date/Time   CHOL 198 07/05/2009 0957   TRIG 98.0 07/05/2009 0957   HDL 64.50 07/05/2009 0957   CHOLHDL 3 07/05/2009 0957   VLDL 19.6 07/05/2009 0957     LDLCALC 114* 07/05/2009 0957     Other studies Reviewed: Additional studies/ records that were reviewed today with results demonstrating: cath report reviewed.   ASSESSMENT AND PLAN:  1. Coronary artery disease: Multivessel. No clear significant LAD disease. There is moderate disease in the mid vessel. There is a tight diagonal lesion. The circumflex lesion may be treatable with stent. The right coronary artery is not favorable for PCI. I  again presented the options to the patient. They include continued medical therapy alone; could consider adding Ranexa or amlodipine if blood pressure tolerates. The second option would be repeat cardiac cath with FFR of the LAD. If the FFR of the LAD was negative, we would plan to treat the circumflex percutaneously and then continue medical therapy in the hopes of reducing angina.  The third option would be referral to cardiac surgeon for possible bypass surgery, although this would likely not involve bypass of the LAD, and thus would be purely for symptom relief and not for any survival benefit. I explained that it appears he is failing medical therapy given his worsening shortness of breath. This could be an anginal equivalent. I recommended going back to do another heart catheterization. He is reluctant to do that. He is comfortable with medical therapy at this time. We'll start Ranexa 500 mg twice a day. If Symptoms get worse or do not improve, we would reconsider repeat catheterization with FFR of the LAD and if this was negative, PCI of the circumflex and possibly diagonal.  Could make an antegrade attempt at the CTO of the RCA.   2. Hyperlipidemia: Given his LDL above target, we started atorvastatin 10 mg daily. He'll need lipids and liver function checked in a couple of months.  Not reporting muscle pains. I don't think his shortness of breath is related to atorvastatin that he started in April 2017. 3. Hypertension: Continue ACE inhibitor.   Controlled. 4. Obesity: He is concerned that he is unable to lose weight. In the past, he has lost weight through diet programs. He did some type of Atkins type diet in the past. He is concerned about trying this now. I would recommend that he try to eat higher protein and higher fiber content foods and minimize carbohydrates. He did ask for a handicap parking placard which we signed the form today.   Current medicines are reviewed at length with the patient today.  The patient concerns regarding his medicines were addressed.  The following changes have been made:  Start Ranexa 500 mg daily  Labs/ tests ordered today include: in one month  No orders of the defined types were placed in this encounter.    Recommend 150 minutes/week of aerobic exercise Low fat, low carb, high fiber diet recommended  Disposition:   FU in 4-6 weeks as scheduled   Signed, Larae Grooms, MD  08/15/2015 8:15 AM    [facility] [place], [place], [place]  [address] Phone: [phone]; Fax: [phone]

## 2015-08-15 ENCOUNTER — Ambulatory Visit (INDEPENDENT_AMBULATORY_CARE_PROVIDER_SITE_OTHER): Payer: BLUE CROSS/BLUE SHIELD | Admitting: Interventional Cardiology

## 2015-08-15 ENCOUNTER — Encounter: Payer: Self-pay | Admitting: Interventional Cardiology

## 2015-08-15 VITALS — BP 150/92 | HR 68 | Ht 71.0 in | Wt 270.0 lb

## 2015-08-15 DIAGNOSIS — E785 Hyperlipidemia, unspecified: Secondary | ICD-10-CM

## 2015-08-15 DIAGNOSIS — I25119 Atherosclerotic heart disease of native coronary artery with unspecified angina pectoris: Secondary | ICD-10-CM

## 2015-08-15 DIAGNOSIS — I10 Essential (primary) hypertension: Secondary | ICD-10-CM

## 2015-08-15 DIAGNOSIS — I208 Other forms of angina pectoris: Secondary | ICD-10-CM

## 2015-08-15 DIAGNOSIS — E669 Obesity, unspecified: Secondary | ICD-10-CM | POA: Diagnosis not present

## 2015-08-15 MED ORDER — RANOLAZINE ER 500 MG PO TB12: 500.0000 mg | ORAL_TABLET | Freq: Two times a day (BID) | ORAL | Status: DC

## 2015-08-15 NOTE — Patient Instructions (Signed)
Medication Instructions:  Start taking Ranexa 500 mg twice daily-all other medications remain the same.  Labwork: None  Testing/Procedures: None  Follow-Up: Your physician recommends that you schedule a follow-up appointment in: 4 to 6 weeks     If you need a refill on your cardiac medications before your next appointment, please call your pharmacy.

## 2015-08-24 ENCOUNTER — Ambulatory Visit: Payer: BLUE CROSS/BLUE SHIELD | Admitting: Interventional Cardiology

## 2015-10-11 ENCOUNTER — Encounter: Payer: Self-pay | Admitting: Interventional Cardiology

## 2015-10-11 ENCOUNTER — Ambulatory Visit (INDEPENDENT_AMBULATORY_CARE_PROVIDER_SITE_OTHER): Payer: BLUE CROSS/BLUE SHIELD | Admitting: Interventional Cardiology

## 2015-10-11 VITALS — BP 110/80 | HR 80 | Ht 71.0 in | Wt 277.0 lb

## 2015-10-11 DIAGNOSIS — I25119 Atherosclerotic heart disease of native coronary artery with unspecified angina pectoris: Secondary | ICD-10-CM | POA: Diagnosis not present

## 2015-10-11 DIAGNOSIS — I208 Other forms of angina pectoris: Secondary | ICD-10-CM

## 2015-10-11 DIAGNOSIS — E669 Obesity, unspecified: Secondary | ICD-10-CM

## 2015-10-11 MED ORDER — FUROSEMIDE 40 MG PO TABS: 40.0000 mg | ORAL_TABLET | Freq: Every day | ORAL | Status: DC

## 2015-10-11 NOTE — Patient Instructions (Signed)
Medication Instructions:  Start taking Lasix 40 mg as needed. All other medications remain the same.  Labwork: None  Testing/Procedures: None  Follow-Up: Your physician recommends that you schedule a follow-up appointment in: 3 to 4 months.     If you need a refill on your cardiac medications before your next appointment, please call your pharmacy.

## 2015-10-11 NOTE — Progress Notes (Signed)
Patient ID: Erik [name], male   DOB: October 22, 1945, 71 y.o.   MRN: [id number]     Cardiology Office Note   Date:  10/11/2015   ID:  Erik [name], DOB [birth year], MRN [id number]  PCP:  Erik Bellows, MD    No chief complaint on file. Follow-up coronary artery disease   Wt Readings from Last 3 Encounters:  10/11/15 277 lb (125.646 kg)  08/15/15 270 lb (122.471 kg)  07/19/15 270 lb (122.471 kg)       History of Present Illness: Erik [name] is a 70 y.o. male  who had progressive anginal symptoms. He underwent cardiac catheterization in April 2017 which revealed a focal lesion in the circumflex. There was a chronic total occlusion of the RCA with left to right collaterals. Due to the lack of LAD disease noted, he was not referred for bypass and the plan was for medical therapy. He returns today for follow-up.  Of note, there is difficulty completing the heart cath from his right radial approach. He had no problems with the right radial site post cath. He continues to have some anginal symptoms although they have improved with increasing medical therapy. It was noted at the time of cath that his RCA is not a great candidate for percutaneous intervention of the CTO because there are multiple branch vessels which potentially would be lost, along this long chronic total occlusion.  Isosorbide was increased in April 2017. After this, he was better in terms of chest pain. Rare nitroglycerin use. No chest discomfort. He has returned back to work., he sits at his desk    However, he has more SHOB.  He has to rest after a few minutes of walking.  He stops walking now while walking his dog.  He gets too tired. He has trouble taking the trash can out.   He has noted some edema in his legs, particularly when he takes his socks off.  He is wondering if some of his problems are related to fluid retention.  He wants to know if testosterone supplementation.    Past Medical History  Diagnosis Date  .  Kidney stone   . Hypertension   . Tinea corporis   . Obesity   . History of echocardiogram     Echo 4/17 - mild concentric LVH, EF 55-60%, no RWMA, Gr 1 DD, mild AI, mild MR, mild LAE    Past Surgical History  Procedure Laterality Date  . Total knee arthroplasty Bilateral   . Cardiac catheterization N/A 07/13/2015    Procedure: Left Heart Cath and Coronary Angiography;  Surgeon: Jettie Booze, MD;  Location: [facility];  Service: Cardiovascular;  Laterality: N/A;     Current Outpatient Prescriptions  Medication Sig Dispense Refill  . aspirin 81 MG tablet Take 81 mg by mouth daily.    Marland Kitchen atorvastatin (LIPITOR) 10 MG tablet Take 1 tablet (10 mg total) by mouth daily. 90 tablet 3  . B Complex Vitamins (VITAMIN B COMPLEX) TABS Take 1 tablet by mouth daily.     . B Complex-C (SUPER B COMPLEX PO) Take 1 tablet by mouth daily.    . Calcium Citrate-Vitamin D (CALCIUM + D PO) Take 1 capsule by mouth as directed. Takes 600 mg as directed    . calcium-vitamin D (OSCAL WITH D) 500-200 MG-UNIT per tablet Take 1 tablet by mouth daily.    . Cholecalciferol (VITAMIN D3) 5000 units CAPS Take 1 capsule by mouth as directed.    Marland Kitchen  clonazePAM (KLONOPIN) 1 MG tablet Take 1 mg by mouth at bedtime as needed (for sleep).     . CORAL CALCIUM PO Take 1 capsule by mouth daily.    . fexofenadine (ALLEGRA ALLERGY) 180 MG tablet Take 180 mg by mouth as directed.    Marland Kitchen FISH OIL-KRILL OIL PO Take 1 tablet by mouth as directed. 1250 mg by mouth daily    . Flaxseed, Linseed, (FLAXSEED OIL) 1000 MG CAPS Take 1 capsule by mouth as directed.    . isosorbide mononitrate (IMDUR) 60 MG 24 hr tablet Take 1.5 tablets (90 mg total) by mouth daily. 135 tablet 11  . meloxicam (MOBIC) 15 MG tablet Take 15 mg by mouth daily.  0  . metoprolol tartrate (LOPRESSOR) 25 MG tablet Take 1 tablet (25 mg total) by mouth 2 (two) times daily. 180 tablet 2  . Multiple Vitamin (MULITIVITAMIN WITH MINERALS) TABS Take 1 tablet by  mouth daily. MEN'S PRIME    . nitroGLYCERIN (NITROSTAT) 0.4 MG SL tablet Place 0.4 mg under the tongue every 5 (five) minutes as needed for chest pain (up to 3 doses in 24 hours).    Marland Kitchen omeprazole (PRILOSEC) 40 MG capsule Take 40 mg by mouth daily.    . ramipril (ALTACE) 10 MG capsule Take 10 mg by mouth daily.    . ranolazine (RANEXA) 500 MG 12 hr tablet Take 1 tablet (500 mg total) by mouth 2 (two) times daily. 180 tablet 3  . Saw Palmetto 450 MG CAPS Take 1 capsule by mouth daily.    . Selenium 200 MCG CAPS Take 1 capsule by mouth as directed.    . tetrahydrozoline-zinc (VISINE-AC) 0.05-0.25 % ophthalmic solution Place 1 drop into both eyes at bedtime as needed (for itchy allergy eyes).    . Ubiquinol 100 MG CAPS Take 1 tablet by mouth as directed.    . vitamin E 400 UNIT capsule Take 400 Units by mouth daily.     No current facility-administered medications for this visit.    Allergies:   Review of patient's allergies indicates no known allergies.    Social History:  The patient  reports that he has quit smoking. He has never used smokeless tobacco.   Family History:  The patient's family history includes Heart disease in his mother; Prostate cancer in his father. There is no history of Heart attack, Stroke, or Hypertension.    ROS:  Please see the history of present illness.   Otherwise, review of systems are positive for chest pain, improving, no NTG use in the last 2 days.   All other systems are reviewed and negative.    PHYSICAL EXAM: VS:  BP 110/80 mmHg  Pulse 80  Ht 5\' 11"  (1.803 m)  Wt 277 lb (125.646 kg)  BMI 38.65 kg/m2 , BMI Body mass index is 38.65 kg/(m^2). GEN: Well nourished, well developed, in no acute distress  HEENT: normal  Neck: no JVD, carotid bruits, or masses Cardiac: RRR; no murmurs, rubs, or gallops, pitting leg edema bilaterally Respiratory:  clear to auscultation bilaterally, normal work of breathing GI: soft, nontender, nondistended, + BS, obese MS:  no deformity or atrophy  Skin: warm and dry, no rash Neuro:  Strength and sensation are intact Psych: euthymic mood, full affect    Recent Labs: 06/30/2015: Brain Natriuretic Peptide 41.8; BUN 12; Creat 1.10; Hemoglobin 12.9*; Platelets 338; Potassium 4.9; Sodium 139   Lipid Panel    Component Value Date/Time   CHOL 198 07/05/2009 0957  TRIG 98.0 07/05/2009 0957   HDL 64.50 07/05/2009 0957   CHOLHDL 3 07/05/2009 0957   VLDL 19.6 07/05/2009 0957   LDLCALC 114* 07/05/2009 0957     Other studies Reviewed: Additional studies/ records that were reviewed today with results demonstrating: cath report reviewed.   ASSESSMENT AND PLAN:  1. Coronary artery disease: Multivessel. No clear significant LAD disease. There is moderate disease in the mid vessel. There is a tight diagonal lesion. The circumflex lesion may be treatable with stent. The right coronary artery is not favorable for PCI. I again presented the options to the patient. They include continued medical therapy alone; could consider adding Ranexa or amlodipine if blood pressure tolerates. The second option would be repeat cardiac cath with FFR of the LAD. If the FFR of the LAD was negative, we would plan to treat the circumflex percutaneously and then continue medical therapy in the hopes of reducing angina.  The third option would be referral to cardiac surgeon for possible bypass surgery, although this would likely not involve bypass of the LAD, and thus would be purely for symptom relief and not for any survival benefit. I explained that it appears he is failing medical therapy given his worsening shortness of breath. This could be an anginal equivalent. I recommended going back to do another heart catheterization. He is reluctant to do that. He is comfortable with medical therapy at this time. Less angina with Ranexa 500 mg twice a day. If Symptoms get worse or do not improve, we would reconsider repeat catheterization with FFR of the  LAD and if this was negative, PCI of the circumflex and possibly diagonal.  Could make an antegrade attempt at the CTO of the RCA.   2. Hyperlipidemia: Given his LDL above target, we started atorvastatin 10 mg daily. He'll need lipids and liver function checked in a couple of months.  Not reporting muscle pains. I don't think his shortness of breath is related to atorvastatin that he started in April 2017. 3. Hypertension: Continue ACE inhibitor.  Controlled. 4. Obesity: He is concerned that he is unable to lose weight. In the past, he has lost weight through diet programs. He did some type of Atkins type diet in the past. He is concerned about trying this now. I would recommend that he try to eat higher protein and higher fiber content foods and minimize carbohydrates.  I think has been deconditioned or some time as he has been gaining weight over several years. 5. If sx get worse before his next appt, he will contact us to schedule PCI.    Current medicines are reviewed at length with the patient today.  The patient concerns regarding his medicines were addressed.  The following changes have been made:   Labs/ tests ordered today include: in one month  No orders of the defined types were placed in this encounter.    Recommend 150 minutes/week of aerobic exercise Low fat, low carb, high fiber diet recommended  Disposition:   FU in 4-6 weeks as scheduled   Signed, Larae Grooms, MD  10/11/2015 10:29 AM    [facility] [place], [place], [place]  [address] Phone: [phone]; Fax: [phone]

## 2015-11-17 ENCOUNTER — Other Ambulatory Visit: Payer: Self-pay | Admitting: *Deleted

## 2015-11-17 DIAGNOSIS — R072 Precordial pain: Secondary | ICD-10-CM

## 2015-11-17 MED ORDER — ATORVASTATIN CALCIUM 10 MG PO TABS: 10.0000 mg | ORAL_TABLET | Freq: Every day | ORAL | 2 refills | Status: DC

## 2015-11-17 MED ORDER — RANOLAZINE ER 500 MG PO TB12: 500.0000 mg | ORAL_TABLET | Freq: Two times a day (BID) | ORAL | 2 refills | Status: DC

## 2015-11-17 MED ORDER — ISOSORBIDE MONONITRATE ER 60 MG PO TB24: 90.0000 mg | ORAL_TABLET | Freq: Every day | ORAL | 2 refills | Status: DC

## 2015-11-17 MED ORDER — FUROSEMIDE 40 MG PO TABS: 40.0000 mg | ORAL_TABLET | Freq: Every day | ORAL | 2 refills | Status: DC

## 2015-11-20 DIAGNOSIS — K219 Gastro-esophageal reflux disease without esophagitis: Secondary | ICD-10-CM | POA: Diagnosis not present

## 2015-12-01 ENCOUNTER — Other Ambulatory Visit: Payer: Self-pay | Admitting: *Deleted

## 2015-12-01 MED ORDER — NITROGLYCERIN 0.4 MG SL SUBL
0.4000 mg | SUBLINGUAL_TABLET | SUBLINGUAL | 1 refills | Status: DC | PRN
Start: 2015-12-01 — End: 2016-01-24

## 2015-12-30 ENCOUNTER — Emergency Department (HOSPITAL_BASED_OUTPATIENT_CLINIC_OR_DEPARTMENT_OTHER): Payer: Managed Care, Other (non HMO)

## 2015-12-30 ENCOUNTER — Emergency Department (HOSPITAL_BASED_OUTPATIENT_CLINIC_OR_DEPARTMENT_OTHER)
Admission: EM | Admit: 2015-12-30 | Discharge: 2015-12-30 | Disposition: A | Discharge status: Home / Self Care | Payer: Managed Care, Other (non HMO) | Attending: Emergency Medicine | Admitting: Emergency Medicine

## 2015-12-30 ENCOUNTER — Encounter (HOSPITAL_BASED_OUTPATIENT_CLINIC_OR_DEPARTMENT_OTHER): Payer: Self-pay | Admitting: Emergency Medicine

## 2015-12-30 DIAGNOSIS — M25571 Pain in right ankle and joints of right foot: Secondary | ICD-10-CM | POA: Diagnosis not present

## 2015-12-30 DIAGNOSIS — Z87891 Personal history of nicotine dependence: Secondary | ICD-10-CM | POA: Diagnosis not present

## 2015-12-30 DIAGNOSIS — Z7982 Long term (current) use of aspirin: Secondary | ICD-10-CM | POA: Diagnosis not present

## 2015-12-30 DIAGNOSIS — I2510 Atherosclerotic heart disease of native coronary artery without angina pectoris: Secondary | ICD-10-CM | POA: Diagnosis not present

## 2015-12-30 DIAGNOSIS — I10 Essential (primary) hypertension: Secondary | ICD-10-CM | POA: Diagnosis not present

## 2015-12-30 DIAGNOSIS — Z79899 Other long term (current) drug therapy: Secondary | ICD-10-CM | POA: Insufficient documentation

## 2015-12-30 DIAGNOSIS — M79671 Pain in right foot: Secondary | ICD-10-CM

## 2015-12-30 DIAGNOSIS — M729 Fibroblastic disorder, unspecified: Secondary | ICD-10-CM | POA: Diagnosis not present

## 2015-12-30 DIAGNOSIS — R600 Localized edema: Secondary | ICD-10-CM | POA: Diagnosis not present

## 2015-12-30 MED ORDER — HYDROCODONE-ACETAMINOPHEN 5-325 MG PO TABS: 1.0000 | ORAL_TABLET | Freq: Four times a day (QID) | ORAL | 0 refills | Status: DC | PRN

## 2015-12-30 MED ORDER — PREDNISONE 20 MG PO TABS: 40.0000 mg | ORAL_TABLET | Freq: Every day | ORAL | 0 refills | Status: DC

## 2015-12-30 NOTE — ED Provider Notes (Signed)
[facility] Provider Note   CSN: [id number] Arrival date & time: 12/30/15  1346   By signing my name below, I, Neta Mends, attest that this documentation has been prepared under the direction and in the presence of Blanchie Dessert, MD . Electronically Signed: Neta Mends, ED Scribe. 12/30/2015. 3:25 PM.   History   Chief Complaint Chief Complaint  Patient presents with  . Foot Pain  . Leg Swelling    The history is provided by the patient. No language interpreter was used.   HPI Comments:  Arl Vandorn is a 70 y.o. male with PMHx of HTN and CAD who presents to the Emergency Department complaining of worsening right foot/ankle pain x6 days. Pt states that he began to notice pain in his right foot 6 days ago while working outside. Pt states that the pain has been gradually worsening and that he is now unable to bear weight on his right foot. Pt only has pain when bearing weight.  As long as his foot is up there is no pain.  Worst when trying to get out of bed in the morning. Pt reports associated swelling and pain upon exertion. Pt notes that the pain radiates up his right leg to his knee. Pt has had gout in the past but states that this pain does not feel similar to when he had gout. No alleviating factors noted. Pt denies injury/trauma, and other associated symptoms but was wearing some old shoes right before the pain started.  Past Medical History:  Diagnosis Date  . History of echocardiogram    Echo 4/17 - mild concentric LVH, EF 55-60%, no RWMA, Gr 1 DD, mild AI, mild MR, mild LAE  . Hypertension   . Kidney stone   . Obesity   . Tinea corporis     Patient Active Problem List   Diagnosis Date Noted  . CAD (coronary artery disease) 08/14/2015  . Hyperlipidemia 07/19/2015  . Angina pectoris ([place])   . Chest pain 05/05/2014  . GERD (gastroesophageal reflux disease) 12/15/2013  . Essential hypertension, benign 10/03/2013  . Obesity, unspecified  10/03/2013  . Exertional angina ([place]) 10/03/2013  . Tinea corporis   . Obesity   . ROSACEA 09/22/2009  . SHOULDER PAIN 09/22/2009  . HYPERTROPHY PROSTATE W/O UR OBST & OTH LUTS 07/05/2009  . INSOMNIA-SLEEP DISORDER-UNSPEC 07/05/2009    Past Surgical History:  Procedure Laterality Date  . CARDIAC CATHETERIZATION N/A 07/13/2015   Procedure: Left Heart Cath and Coronary Angiography;  Surgeon: Jettie Booze, MD;  Location: [facility];  Service: Cardiovascular;  Laterality: N/A;  . TOTAL KNEE ARTHROPLASTY Bilateral        Home Medications    Prior to Admission medications   Medication Sig Start Date End Date Taking? Authorizing Provider  aspirin 81 MG tablet Take 81 mg by mouth daily.    Historical Provider, MD  atorvastatin (LIPITOR) 10 MG tablet Take 1 tablet (10 mg total) by mouth daily. 11/17/15   Jettie Booze, MD  B Complex Vitamins (VITAMIN B COMPLEX) TABS Take 1 tablet by mouth daily.     Historical Provider, MD  B Complex-C (SUPER B COMPLEX PO) Take 1 tablet by mouth daily.    Historical Provider, MD  Calcium Citrate-Vitamin D (CALCIUM + D PO) Take 1 capsule by mouth as directed. Takes 600 mg as directed    Historical Provider, MD  calcium-vitamin D (OSCAL WITH D) 500-200 MG-UNIT per tablet Take 1 tablet by mouth  daily.    Historical Provider, MD  Cholecalciferol (VITAMIN D3) 5000 units CAPS Take 1 capsule by mouth as directed.    Historical Provider, MD  clonazePAM (KLONOPIN) 1 MG tablet Take 1 mg by mouth at bedtime as needed (for sleep).  07/12/13   Historical Provider, MD  CORAL CALCIUM PO Take 1 capsule by mouth daily.    Historical Provider, MD  fexofenadine (ALLEGRA ALLERGY) 180 MG tablet Take 180 mg by mouth as directed.    Historical Provider, MD  FISH OIL-KRILL OIL PO Take 1 tablet by mouth as directed. 1250 mg by mouth daily    Historical Provider, MD  Flaxseed, Linseed, (FLAXSEED OIL) 1000 MG CAPS Take 1 capsule by mouth as directed.    Historical  Provider, MD  furosemide (LASIX) 40 MG tablet Take 1 tablet (40 mg total) by mouth daily. 11/17/15   Jettie Booze, MD  isosorbide mononitrate (IMDUR) 60 MG 24 hr tablet Take 1.5 tablets (90 mg total) by mouth daily. 11/17/15   Jettie Booze, MD  meloxicam (MOBIC) 15 MG tablet Take 15 mg by mouth daily. 12/08/14   Historical Provider, MD  metoprolol tartrate (LOPRESSOR) 25 MG tablet Take 1 tablet (25 mg total) by mouth 2 (two) times daily. 06/23/15   Jettie Booze, MD  Multiple Vitamin (MULITIVITAMIN WITH MINERALS) TABS Take 1 tablet by mouth daily. [place] Provider, MD  nitroGLYCERIN (NITROSTAT) 0.4 MG SL tablet Place 1 tablet (0.4 mg total) under the tongue every 5 (five) minutes as needed for chest pain (up to 3 doses in 24 hours). 12/01/15   Jettie Booze, MD  omeprazole (PRILOSEC) 40 MG capsule Take 40 mg by mouth daily.    Historical Provider, MD  ramipril (ALTACE) 10 MG capsule Take 10 mg by mouth daily.    Historical Provider, MD  ranolazine (RANEXA) 500 MG 12 hr tablet Take 1 tablet (500 mg total) by mouth 2 (two) times daily. 11/17/15   Jettie Booze, MD  Saw Palmetto 450 MG CAPS Take 1 capsule by mouth daily.    Historical Provider, MD  Selenium 200 MCG CAPS Take 1 capsule by mouth as directed.    Historical Provider, MD  tetrahydrozoline-zinc (VISINE-AC) 0.05-0.25 % ophthalmic solution Place 1 drop into both eyes at bedtime as needed (for itchy allergy eyes).    Historical Provider, MD  Ubiquinol 100 MG CAPS Take 1 tablet by mouth as directed.    Historical Provider, MD  vitamin E 400 UNIT capsule Take 400 Units by mouth daily.    Historical Provider, MD    Family History Family History  Problem Relation Age of Onset  . Heart disease Mother   . Prostate cancer Father   . Heart attack Neg Hx   . Stroke Neg Hx   . Hypertension Neg Hx     Social History Social History  Substance Use Topics  . Smoking status: Former Research scientist (life sciences)  . Smokeless  tobacco: Never Used  . Alcohol use Not on file     Allergies   Review of patient's allergies indicates no known allergies.   Review of Systems Review of Systems  Musculoskeletal: Positive for arthralgias, gait problem and joint swelling.  All other systems reviewed and are negative.    Physical Exam Updated Vital Signs BP 144/75   Pulse 69   Temp 97.9 F (36.6 C)   Resp 16   Ht 5\' 10"  (1.778 m)   Wt 265 lb (120.2 kg)  SpO2 100%   BMI 38.02 kg/m   Physical Exam  Constitutional: He appears well-developed and well-nourished. No distress.  HENT:  Head: Normocephalic and atraumatic.  Eyes: Conjunctivae are normal.  Cardiovascular: Normal rate and regular rhythm.   1+ DP pulse.   Pulmonary/Chest: Effort normal and breath sounds normal.  Abdominal: He exhibits no distension.  Musculoskeletal:  Bilateral legs with multiple varicose veins, notable swelling in either calf and no calf tenderness bilaterally. Tenderness over the base of the 5th metatarsal. No erythema or warmth to the ankle but tenderness to palpation over medial and lateral malleolus. Full ROM of ankle. Pain reproduced with plantar flexion of the right foot.   Neurological: He is alert.  Sensation intact.   Skin: Skin is warm and dry. Capillary refill takes less than 2 seconds.  Psychiatric: He has a normal mood and affect.  Nursing note and vitals reviewed.    ED Treatments / Results  DIAGNOSTIC STUDIES:  Oxygen Saturation is 100% on RA, normal by my interpretation.    COORDINATION OF CARE:  3:18 PM Discussed treatment plan with pt at bedside and pt agreed to plan.   Labs (all labs ordered are listed, but only abnormal results are displayed) Labs Reviewed - No data to display  EKG  EKG Interpretation None       Radiology Dg Ankle Complete Right  Result Date: 12/30/2015 CLINICAL DATA:  Worsening right ankle and foot pain for 6 days. No specific injury. EXAM: RIGHT ANKLE - COMPLETE 3+ VIEW  COMPARISON:  None. FINDINGS: The ankle mortise is maintained. No acute ankle fracture. No osteochondral lesion. No definite joint effusion. The subtalar joints are maintained. Small calcaneal heel spur. Small vessel calcifications are noted. IMPRESSION: No acute ankle fracture. Electronically Signed   By: Marijo Sanes M.D.   On: 12/30/2015 16:26   US Venous Img Lower Unilateral Right  Result Date: 12/30/2015 CLINICAL DATA:  70 year old male with a history of right foot and ankle pain EXAM: RIGHT LOWER EXTREMITY VENOUS DOPPLER ULTRASOUND TECHNIQUE: Gray-scale sonography with graded compression, as well as color Doppler and duplex ultrasound were performed to evaluate the lower extremity deep venous systems from the level of the common femoral vein and including the common femoral, femoral, profunda femoral, popliteal and calf veins including the posterior tibial, peroneal and gastrocnemius veins when visible. The superficial great saphenous vein was also interrogated. Spectral Doppler was utilized to evaluate flow at rest and with distal augmentation maneuvers in the common femoral, femoral and popliteal veins. COMPARISON:  None. FINDINGS: Contralateral Common Femoral Vein: Respiratory phasicity is normal and symmetric with the symptomatic side. No evidence of thrombus. Normal compressibility. Common Femoral Vein: No evidence of thrombus. Normal compressibility, respiratory phasicity and response to augmentation. Saphenofemoral Junction: No evidence of thrombus. Normal compressibility and flow on color Doppler imaging. Profunda Femoral Vein: No evidence of thrombus. Normal compressibility and flow on color Doppler imaging. Femoral Vein: No evidence of thrombus. Normal compressibility, respiratory phasicity and response to augmentation. Popliteal Vein: No evidence of thrombus. Normal compressibility, respiratory phasicity and response to augmentation. Calf Veins: No evidence of thrombus. Normal compressibility  and flow on color Doppler imaging. Superficial Great Saphenous Vein: No evidence of thrombus. Normal compressibility and flow on color Doppler imaging. Other Findings:  None. IMPRESSION: Sonographic survey of the right lower extremity negative for DVT. Signed, Dulcy Fanny. Earleen Newport, DO [facility] Electronically Signed   By: Corrie Mckusick D.O.   On: 12/30/2015 16:15  Dg Foot Complete Right  Result Date: 12/30/2015 CLINICAL DATA:  Foot pain for 6 days.  No specific injury. EXAM: RIGHT FOOT COMPLETE - 3+ VIEW COMPARISON:  01/03/2015 FINDINGS: Stable small metallic foreign body noted along the lateral margin of the proximal phalanx of the fifth toe. The joint spaces are maintained. No acute fracture is identified. Stable bipartite medial sesamoid of the great toe. Small calcaneal heel spur. IMPRESSION: No acute fracture. Stable metallic foreign body noted near the fifth toe. Electronically Signed   By: Marijo Sanes M.D.   On: 12/30/2015 16:28    Procedures Procedures (including critical care time)  Medications Ordered in ED Medications - No data to display   Initial Impression / Assessment and Plan / ED Course  I have reviewed the triage vital signs and the nursing notes.  Pertinent labs & imaging results that were available during my care of the patient were reviewed by me and considered in my medical decision making (see chart for details).  Clinical Course   Pt with mmp presenting with foot pain.  Has been worsening over a week but only has pain when attempting to walk.  Pt has had bilateral lower ext edema however that seems to be unrelated.  Edema is dependent and improves with elevation.  No chest pain or SOB.  On exam pt's pain is over the 5th metatarsal and plantar surface of the foot.  No skin break down.  No evidence or exam findings consistent with gout.  DVT study neg.  Imaging shows persistent foreign body that is unchanged from 1  year ago but in the location of where the pt has pain.  Most likely fasciitis.  Pt given post-op shoe.  He is already on meloxicam and told to use tylenol and given prednisone.  Also give hydrocodone to use prn if other meds not helpful.  Given f/u wth ortho for further eval and also possible inserts for shoes.    Final Clinical Impressions(s) / ED Diagnoses   Final diagnoses:  Foot pain, right  Fasciitis    New Prescriptions New Prescriptions   HYDROCODONE-ACETAMINOPHEN (NORCO/VICODIN) 5-325 MG TABLET    Take 1 tablet by mouth every 6 (six) hours as needed.   PREDNISONE (DELTASONE) 20 MG TABLET    Take 2 tablets (40 mg total) by mouth daily.   I personally performed the services described in this documentation, which was scribed in my presence.  The recorded information has been reviewed and considered.     Blanchie Dessert, MD 12/30/15 1715

## 2015-12-30 NOTE — ED Notes (Signed)
Pain with wt bearing to right lower leg, swelling to bil lower legs.

## 2015-12-30 NOTE — ED Triage Notes (Signed)
Pt in c/o R foot pain and lower leg swelling onset 5 days ago. Was seen at [facility] and referred here to r/o DVT. Pt is alert, interactive, ambulatory in NAD.

## 2015-12-30 NOTE — ED Notes (Signed)
Family at bedside. 

## 2016-01-02 ENCOUNTER — Other Ambulatory Visit: Payer: Self-pay | Admitting: *Deleted

## 2016-01-02 MED ORDER — METOPROLOL TARTRATE 25 MG PO TABS: 25.0000 mg | ORAL_TABLET | Freq: Two times a day (BID) | ORAL | 2 refills | Status: DC

## 2016-01-02 MED ORDER — RAMIPRIL 10 MG PO CAPS: 10.0000 mg | ORAL_CAPSULE | Freq: Every day | ORAL | 2 refills | Status: DC

## 2016-01-09 DIAGNOSIS — Z8739 Personal history of other diseases of the musculoskeletal system and connective tissue: Secondary | ICD-10-CM | POA: Diagnosis not present

## 2016-01-09 DIAGNOSIS — R5383 Other fatigue: Secondary | ICD-10-CM | POA: Diagnosis not present

## 2016-01-09 DIAGNOSIS — M25471 Effusion, right ankle: Secondary | ICD-10-CM | POA: Diagnosis not present

## 2016-01-09 DIAGNOSIS — M79671 Pain in right foot: Secondary | ICD-10-CM | POA: Diagnosis not present

## 2016-01-09 DIAGNOSIS — Z23 Encounter for immunization: Secondary | ICD-10-CM | POA: Diagnosis not present

## 2016-01-09 DIAGNOSIS — R7309 Other abnormal glucose: Secondary | ICD-10-CM | POA: Diagnosis not present

## 2016-01-17 ENCOUNTER — Ambulatory Visit (INDEPENDENT_AMBULATORY_CARE_PROVIDER_SITE_OTHER): Payer: Managed Care, Other (non HMO) | Admitting: Interventional Cardiology

## 2016-01-17 ENCOUNTER — Encounter: Payer: Self-pay | Admitting: Interventional Cardiology

## 2016-01-17 ENCOUNTER — Encounter (INDEPENDENT_AMBULATORY_CARE_PROVIDER_SITE_OTHER): Payer: Self-pay

## 2016-01-17 VITALS — BP 122/80 | HR 74 | Ht 70.0 in | Wt 264.2 lb

## 2016-01-17 DIAGNOSIS — M25473 Effusion, unspecified ankle: Secondary | ICD-10-CM | POA: Diagnosis not present

## 2016-01-17 DIAGNOSIS — E782 Mixed hyperlipidemia: Secondary | ICD-10-CM | POA: Diagnosis not present

## 2016-01-17 DIAGNOSIS — I25119 Atherosclerotic heart disease of native coronary artery with unspecified angina pectoris: Secondary | ICD-10-CM

## 2016-01-17 DIAGNOSIS — I209 Angina pectoris, unspecified: Secondary | ICD-10-CM

## 2016-01-17 DIAGNOSIS — I208 Other forms of angina pectoris: Secondary | ICD-10-CM

## 2016-01-17 DIAGNOSIS — I10 Essential (primary) hypertension: Secondary | ICD-10-CM

## 2016-01-17 NOTE — Patient Instructions (Signed)
**Note De-identified Chennel Olivos Obfuscation** Medication Instructions:  Same-no changes  Labwork: None  Testing/Procedures: None  Follow-Up: Your physician wants you to follow-up in: 6 months. You will receive a reminder letter in the mail two months in advance. If you don't receive a letter, please call our office to schedule the follow-up appointment.      If you need a refill on your cardiac medications before your next appointment, please call your pharmacy.   

## 2016-01-17 NOTE — Progress Notes (Signed)
Patient ID: Erik [name], male   DOB: [birth year], 69 y.o.   MRN: [id number]     Cardiology Office Note   Date:  01/17/2016   ID:  Erik [name], DOB [birth year], MRN [id number]  PCP:  Jonathon Bellows, MD    No chief complaint on file. Follow-up coronary artery disease   Wt Readings from Last 3 Encounters:  01/17/16 119.8 kg (264 lb 3.2 oz)  12/30/15 120.2 kg (265 lb)  10/11/15 125.6 kg (277 lb)       History of Present Illness: Erik [name] is a 70 y.o. male  who had progressive anginal symptoms. He underwent cardiac catheterization in April 2017 which revealed a focal lesion in the circumflex. There was a chronic total occlusion of the RCA with left to right collaterals. Due to the lack of LAD disease noted, he was not referred for bypass and the plan was for medical therapy. He returns today for follow-up.  Of note, there is difficulty completing the heart cath from his right radial approach. He had no problems with the right radial site post cath. He continues to have some anginal symptoms although they have improved with increasing medical therapy. It was noted at the time of cath that his RCA is not a great candidate for percutaneous intervention of the CTO because there are multiple branch vessels which potentially would be lost, along this long chronic total occlusion.  Isosorbide was increased in April 2017. After this, he was better in terms of chest pain. Rare nitroglycerin use. No chest discomfort. He has returned back to work., he sits at his desk    However, he has more SHOB.  He has to rest after a few minutes of walking.  He stops walking now while walking his dog.  He gets too tired. He has trouble taking the trash can out.   He has noted some edema in his legs, particularly when he takes his socks off.  He is wondering if some of his problems are related to fluid retention.  I recommended against testosterone supplement in the past.    Since his last visit, he has  lost weight.  His wife has him on a special diet.    He had an ankle injury and had swelling.  He took some Lasix on consecutive days and felt tired.  He had to use compression stocking.  He went to a facility for u/s to r/o DVT.  Study was negative.   His chest pain is better.  He has decreased his NTG usage dramatically.  The latest medicine has helped a lot.      Past Medical History:  Diagnosis Date  . History of echocardiogram    Echo 4/17 - mild concentric LVH, EF 55-60%, no RWMA, Gr 1 DD, mild AI, mild MR, mild LAE  . Hypertension   . Kidney stone   . Obesity   . Tinea corporis     Past Surgical History:  Procedure Laterality Date  . CARDIAC CATHETERIZATION N/A 07/13/2015   Procedure: Left Heart Cath and Coronary Angiography;  Surgeon: Jettie Booze, MD;  Location: [facility];  Service: Cardiovascular;  Laterality: N/A;  . TOTAL KNEE ARTHROPLASTY Bilateral      Current Outpatient Prescriptions  Medication Sig Dispense Refill  . aspirin 81 MG tablet Take 81 mg by mouth daily.    Marland Kitchen atorvastatin (LIPITOR) 10 MG tablet Take 1 tablet (10 mg total) by mouth daily. 90 tablet 2  . B Complex  Vitamins (VITAMIN B COMPLEX) TABS Take 1 tablet by mouth daily.     . B Complex-C (SUPER B COMPLEX PO) Take 1 tablet by mouth daily.    . Calcium Citrate-Vitamin D (CALCIUM + D PO) Take 1 capsule by mouth as directed. Takes 600 mg as directed    . calcium-vitamin D (OSCAL WITH D) 500-200 MG-UNIT per tablet Take 1 tablet by mouth daily.    . Cholecalciferol (VITAMIN D3) 5000 units CAPS Take 1 capsule by mouth as directed.    . clonazePAM (KLONOPIN) 1 MG tablet Take 1 mg by mouth at bedtime as needed (for sleep).     . CORAL CALCIUM PO Take 1 capsule by mouth daily.    . fexofenadine (ALLEGRA ALLERGY) 180 MG tablet Take 180 mg by mouth as directed.    Marland Kitchen FISH OIL-KRILL OIL PO Take 1 tablet by mouth as directed. 1250 mg by mouth daily    . Flaxseed, Linseed, (FLAXSEED OIL) 1000 MG  CAPS Take 1 capsule by mouth as directed.    . furosemide (LASIX) 40 MG tablet Take 40 mg by mouth as needed for fluid or edema.    Marland Kitchen HYDROcodone-acetaminophen (NORCO/VICODIN) 5-325 MG tablet Take 1 tablet by mouth every 6 (six) hours as needed for moderate pain.    . isosorbide mononitrate (IMDUR) 60 MG 24 hr tablet Take 1.5 tablets (90 mg total) by mouth daily. 135 tablet 2  . meloxicam (MOBIC) 15 MG tablet Take 15 mg by mouth daily.  0  . metoprolol tartrate (LOPRESSOR) 25 MG tablet Take 1 tablet (25 mg total) by mouth 2 (two) times daily. 180 tablet 2  . Multiple Vitamin (MULITIVITAMIN WITH MINERALS) TABS Take 1 tablet by mouth daily. MEN'S PRIME    . nitroGLYCERIN (NITROSTAT) 0.4 MG SL tablet Place 1 tablet (0.4 mg total) under the tongue every 5 (five) minutes as needed for chest pain (up to 3 doses in 24 hours). 25 tablet 1  . omeprazole (PRILOSEC) 40 MG capsule Take 40 mg by mouth daily.    . predniSONE (DELTASONE) 20 MG tablet Take 2 tablets (40 mg total) by mouth daily. 10 tablet 0  . ramipril (ALTACE) 10 MG capsule Take 1 capsule (10 mg total) by mouth daily. 90 capsule 2  . ranolazine (RANEXA) 500 MG 12 hr tablet Take 1 tablet (500 mg total) by mouth 2 (two) times daily. 180 tablet 2  . Saw Palmetto 450 MG CAPS Take 1 capsule by mouth daily.    . Selenium 200 MCG CAPS Take 1 capsule by mouth as directed.    . tetrahydrozoline-zinc (VISINE-AC) 0.05-0.25 % ophthalmic solution Place 1 drop into both eyes at bedtime as needed (for itchy allergy eyes).    . Ubiquinol 100 MG CAPS Take 1 tablet by mouth as directed.    . vitamin E 400 UNIT capsule Take 400 Units by mouth daily.     No current facility-administered medications for this visit.     Allergies:   Review of patient's allergies indicates no known allergies.    Social History:  The patient  reports that he has quit smoking. He has never used smokeless tobacco.   Family History:  The patient's family history includes Heart  disease in his mother; Prostate cancer in his father.    ROS:  Please see the history of present illness.   Otherwise, review of systems are positive for significantly less chest pain, ankle swelling.   All other systems are reviewed and negative.  PHYSICAL EXAM: VS:  BP 122/80   Pulse 74   Ht 5\' 10"  (1.778 m)   Wt 119.8 kg (264 lb 3.2 oz)   SpO2 98%   BMI 37.91 kg/m  , BMI Body mass index is 37.91 kg/m. GEN: Well nourished, well developed, in no acute distress  HEENT: normal  Neck: no JVD, carotid bruits, or masses Cardiac: RRR; no murmurs, rubs, or gallops, pitting leg edema bilaterally Respiratory:  clear to auscultation bilaterally, normal work of breathing GI: soft, nontender, nondistended, + BS, obese MS: no deformity or atrophy  Skin: warm and dry, no rash Neuro:  Strength and sensation are intact Psych: euthymic mood, full affect    Recent Labs: 06/30/2015: Brain Natriuretic Peptide 41.8; BUN 12; Creat 1.10; Hemoglobin 12.9; Platelets 338; Potassium 4.9; Sodium 139   Lipid Panel    Component Value Date/Time   CHOL 198 07/05/2009 0957   TRIG 98.0 07/05/2009 0957   HDL 64.50 07/05/2009 0957   CHOLHDL 3 07/05/2009 0957   VLDL 19.6 07/05/2009 0957   LDLCALC 114 (H) 07/05/2009 0957     Other studies Reviewed: Additional studies/ records that were reviewed today with results demonstrating: cath report reviewed.   ASSESSMENT AND PLAN:  1. Coronary artery disease: Multivessel noted from prior cath. No clear significant LAD disease. There is moderate disease in the mid vessel. There is a tight diagonal lesion. The circumflex lesion may be treatable with stent. The right coronary artery is not favorable for PCI. I again presented the options to the patient. They include continued medical therapy alone; could consider adding Ranexa or amlodipine if blood pressure tolerates. The second option would be repeat cardiac cath with FFR of the LAD. If the FFR of the LAD was  negative, we would plan to treat the circumflex percutaneously and then continue medical therapy in the hopes of reducing angina.  The third option would be referral to cardiac surgeon for possible bypass surgery, although this would likely not involve bypass of the LAD, and thus would be purely for symptom relief and not for any survival benefit. I explained that it appears he is failing medical therapy given his worsening shortness of breath. This could be an anginal equivalent. I recommended going back to do another heart catheterization. He is reluctant to do that. He is comfortable with medical therapy at this time. Less angina with Ranexa 500 mg twice a day. If Symptoms get worse or do not improve, we would reconsider repeat catheterization with FFR of the LAD and if this was negative, PCI of the circumflex and possibly diagonal.  Could make an antegrade attempt at the CTO of the RCA.   2. Hyperlipidemia: Given CAD, continue  atorvastatin 10 mg daily. He'll need lipids and liver function checked.  Not reporting muscle pains. I don't think his shortness of breath is related to atorvastatin that he started in April 2017. 3. Hypertension: Continue ACE inhibitor.  Controlled. 4. Obesity: Continue weight loss.  He has changed his diet significantly. 5. If sx get worse before his next appt, he will contact us to schedule PCI.  6. Swelling: related to injury.  COntinue Lasix as needed.    Current medicines are reviewed at length with the patient today.  The patient concerns regarding his medicines were addressed.  The following changes have been made:   Labs/ tests ordered today include: in one month  No orders of the defined types were placed in this encounter.   Recommend 150 minutes/week of  aerobic exercise Low fat, low carb, high fiber diet recommended  Disposition:   FU in 6 months    Signed, Larae Grooms, MD  01/17/2016 4:31 PM    [facility] [address], [place], [place]  [address] Phone: [phone]; Fax: [phone]

## 2016-01-19 DIAGNOSIS — D72829 Elevated white blood cell count, unspecified: Secondary | ICD-10-CM | POA: Diagnosis not present

## 2016-01-19 DIAGNOSIS — R7982 Elevated C-reactive protein (CRP): Secondary | ICD-10-CM | POA: Diagnosis not present

## 2016-01-24 ENCOUNTER — Other Ambulatory Visit: Payer: Self-pay | Admitting: Interventional Cardiology

## 2016-01-30 ENCOUNTER — Other Ambulatory Visit: Payer: Self-pay | Admitting: *Deleted

## 2016-01-30 MED ORDER — NITROGLYCERIN 0.4 MG SL SUBL: 0.4000 mg | SUBLINGUAL_TABLET | SUBLINGUAL | 0 refills | Status: DC | PRN

## 2016-01-31 ENCOUNTER — Telehealth: Payer: Self-pay | Admitting: Interventional Cardiology

## 2016-01-31 NOTE — Telephone Encounter (Signed)
New message      Pt has 3 blocked arteries. His job has an onsite complete gym.  He needs a letter stating what specific exercises he is allowed to do. Please call when ready and he will pick up letter.

## 2016-02-01 NOTE — Telephone Encounter (Signed)
**Note De-identified Erik [name] Obfuscation** Please advise 

## 2016-02-02 NOTE — Telephone Encounter (Signed)
Patient called in asking for letter to be wrote on letter head about how extensive is workouts can be through his job- please call patient and advise before writing letter he has other questions.

## 2016-02-06 NOTE — Telephone Encounter (Signed)
**Note De-Identified Erik [name] Obfuscation** Per the pts request I have added to his letter that he can do water exercises and light walking until he becomes SOB.  He is advised that his letter is ready to pick up at our front office. He requested that I mail his letter to his address which I have done.

## 2016-02-06 NOTE — Telephone Encounter (Signed)
He is on medical therapy for his CAD.  He can exercise to the point that he fells any symptoms.  He should decrease exercise intensity only if he feels any SHOB.  OK to put this on letterhead.

## 2016-02-22 DIAGNOSIS — R55 Syncope and collapse: Secondary | ICD-10-CM | POA: Diagnosis not present

## 2016-02-22 DIAGNOSIS — S7012XA Contusion of left thigh, initial encounter: Secondary | ICD-10-CM | POA: Diagnosis not present

## 2016-02-23 DIAGNOSIS — S8012XA Contusion of left lower leg, initial encounter: Secondary | ICD-10-CM | POA: Diagnosis not present

## 2016-02-29 DIAGNOSIS — D649 Anemia, unspecified: Secondary | ICD-10-CM | POA: Diagnosis not present

## 2016-02-29 DIAGNOSIS — R944 Abnormal results of kidney function studies: Secondary | ICD-10-CM | POA: Diagnosis not present

## 2016-03-06 ENCOUNTER — Other Ambulatory Visit: Payer: Self-pay | Admitting: Family Medicine

## 2016-03-06 ENCOUNTER — Ambulatory Visit
Admission: RE | Admit: 2016-03-06 | Discharge: 2016-03-06 | Disposition: A | Discharge status: Home / Self Care | Payer: Medicare Other | Source: Ambulatory Visit | Attending: Family Medicine | Admitting: Family Medicine

## 2016-03-06 DIAGNOSIS — M79605 Pain in left leg: Secondary | ICD-10-CM

## 2016-03-06 DIAGNOSIS — M7989 Other specified soft tissue disorders: Secondary | ICD-10-CM | POA: Diagnosis not present

## 2016-03-06 DIAGNOSIS — L03116 Cellulitis of left lower limb: Secondary | ICD-10-CM | POA: Diagnosis not present

## 2016-03-06 DIAGNOSIS — S8992XS Unspecified injury of left lower leg, sequela: Secondary | ICD-10-CM | POA: Diagnosis not present

## 2016-04-01 DIAGNOSIS — S81802D Unspecified open wound, left lower leg, subsequent encounter: Secondary | ICD-10-CM | POA: Diagnosis not present

## 2016-04-04 ENCOUNTER — Encounter: Payer: Medicare Other | Attending: Surgery | Admitting: Surgery

## 2016-04-04 DIAGNOSIS — X58XXXA Exposure to other specified factors, initial encounter: Secondary | ICD-10-CM | POA: Diagnosis not present

## 2016-04-04 DIAGNOSIS — I10 Essential (primary) hypertension: Secondary | ICD-10-CM | POA: Diagnosis not present

## 2016-04-04 DIAGNOSIS — Z79899 Other long term (current) drug therapy: Secondary | ICD-10-CM | POA: Insufficient documentation

## 2016-04-04 DIAGNOSIS — Z7982 Long term (current) use of aspirin: Secondary | ICD-10-CM | POA: Diagnosis not present

## 2016-04-04 DIAGNOSIS — M1990 Unspecified osteoarthritis, unspecified site: Secondary | ICD-10-CM | POA: Diagnosis not present

## 2016-04-04 DIAGNOSIS — I2510 Atherosclerotic heart disease of native coronary artery without angina pectoris: Secondary | ICD-10-CM | POA: Insufficient documentation

## 2016-04-04 DIAGNOSIS — S71112A Laceration without foreign body, left thigh, initial encounter: Secondary | ICD-10-CM | POA: Diagnosis not present

## 2016-04-04 DIAGNOSIS — L97122 Non-pressure chronic ulcer of left thigh with fat layer exposed: Secondary | ICD-10-CM | POA: Diagnosis not present

## 2016-04-04 NOTE — Progress Notes (Addendum)
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 Quantity: 1 Physician Procedures CPT4 Code: [id number] Description: [id number] - WC PHYS LEVEL 4 - NEW PT ICD-10 Description Diagnosis S71.112A Laceration without foreign body, left thigh, initi 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

## 2016-04-05 NOTE — Progress Notes (Signed)
NEVEN, FICARA ([id number]) Visit Report for 04/04/2016 Abuse/Suicide Risk Screen Details Patient Name: Erik [name], Erik [name] Date of Service: 04/04/2016 8:00 AM Medical Record Number: [id number] Patient Account Number: [id number] Date of Birth/Sex: June 05, 1945 (71 y.o. Male) Treating RN: Montey Hora Primary Care Physician: Maurice Small Other Clinician: Referring Physician: London Pepper Treating Physician/Extender: Frann Rider in Treatment: 0 Abuse/Suicide Risk Screen Items Answer ABUSE/SUICIDE RISK SCREEN: Has anyone close to you tried to hurt or harm you recentlyo No Do you feel uncomfortable with anyone in your familyo No Has anyone forced you do things that you didnot want to doo No Do you have any thoughts of harming yourselfo No Patient displays signs or symptoms of abuse and/or neglect. No Electronic Signature(s) Signed: 04/04/2016 5:34:24 PM By: Montey Hora Entered By: Montey Hora on 04/04/2016 08:23:47 [name] ([id number]) -------------------------------------------------------------------------------- Activities of Daily Living Details Patient Name: [name] Date of Service: 04/04/2016 8:00 AM Medical Record Number: [id number] Patient Account Number: [id number] Date of Birth/Sex: 04/15/1945 (71 y.o. Male) Treating RN: Montey Hora Primary Care Physician: Maurice Small Other Clinician: Referring Physician: London Pepper Treating Physician/Extender: Frann Rider in Treatment: 0 Activities of Daily Living Items Answer Activities of Daily Living (Please select one for each item) Drive Automobile Completely Able Take Medications Completely Able Use Telephone Completely [place] for Appearance Completely Able Use Toilet Completely Able Bath / Shower Completely Able Dress Self Completely Able Feed Self Completely Able Walk Completely Able Get In / Out Bed Completely [place] for Self Completely Able Electronic Signature(s) Signed: 04/04/2016 5:34:24 PM By: Montey Hora Entered By: Montey Hora on 04/04/2016 08:24:04 [name] ([id number]) -------------------------------------------------------------------------------- Education Assessment Details Patient Name: [name] Date of Service: 04/04/2016 8:00 AM Medical Record Number: [id number] Patient Account Number: [id number] Date of Birth/Sex: 18-Mar-1946 (71 y.o. Male) Treating RN: Montey Hora Primary Care Physician: Maurice Small Other Clinician: Referring Physician: London Pepper Treating Physician/Extender: Frann Rider in Treatment: 0 Primary Learner Assessed: Patient Learning Preferences/Education Level/Primary Language Learning Preference: Explanation, Demonstration Highest Education Level: College or Above Preferred Language: English Cognitive Barrier Assessment/Beliefs Language Barrier: No Translator Needed: No Memory Deficit: No Emotional Barrier: No Cultural/Religious Beliefs Affecting Medical No Care: Physical Barrier Assessment Impaired Vision: No Impaired Hearing: No Decreased Hand dexterity: No Knowledge/Comprehension Assessment Knowledge Level: Medium Comprehension Level: Medium Ability to understand written Medium instructions: Ability to understand verbal Medium instructions: Motivation Assessment Anxiety Level: Calm Cooperation: Cooperative Education Importance: Acknowledges Need Interest in Health Problems: Asks Questions Perception: Coherent Willingness to Engage in Self- Medium Management Activities: Readiness to Engage in Self- Medium Management Activities: Electronic Signature(s) KENA, CRIST ([id number]) Signed: 04/04/2016 5:34:24 PM By: Montey Hora Entered By: Montey Hora on 04/04/2016 08:24:25 [name] ([id number]) -------------------------------------------------------------------------------- Fall Risk  Assessment Details Patient Name: [name] Date of Service: 04/04/2016 8:00 AM Medical Record Number: [id number] Patient Account Number: [id number] Date of Birth/Sex: [birth year] (71 y.o. Male) Treating RN: Montey Hora Primary Care Physician: Maurice Small Other Clinician: Referring Physician: London Pepper Treating Physician/Extender: Frann Rider in Treatment: 0 Fall Risk Assessment Items Have you had 2 or more falls in the last 12 monthso 0 No Have you had any fall that resulted in injury in the last 12 monthso 0 No FALL RISK ASSESSMENT: History of falling - immediate or within 3 months 25 Yes Secondary diagnosis 0 No Ambulatory aid None/bed rest/wheelchair/nurse 0 Yes Crutches/cane/walker 0 No Furniture 0 No IV Access/Saline Lock 0 No Gait/Training Normal/bed  rest/immobile 0 Yes Weak 0 No Impaired 0 No Mental Status Oriented to own ability 0 Yes Electronic Signature(s) Signed: 04/04/2016 5:34:24 PM By: Montey Hora Entered By: Montey Hora on 04/04/2016 08:25:32 [name] ([id number]) -------------------------------------------------------------------------------- Foot Assessment Details Patient Name: [name] Date of Service: 04/04/2016 8:00 AM Medical Record Number: [id number] Patient Account Number: [id number] Date of Birth/Sex: 07-Apr-1945 (71 y.o. Male) Treating RN: Montey Hora Primary Care Physician: Maurice Small Other Clinician: Referring Physician: London Pepper Treating Physician/Extender: Frann Rider in Treatment: 0 Foot Assessment Items Site Locations + = Sensation present, - = Sensation absent, C = Callus, U = Ulcer R = Redness, W = Warmth, M = Maceration, PU = Pre-ulcerative lesion F = Fissure, S = Swelling, D = Dryness Assessment Right: Left: Other Deformity: No No Prior Foot Ulcer: No No Prior Amputation: No No Charcot Joint: No No Ambulatory Status: Ambulatory Without Help Gait: Steady Electronic  Signature(s) Signed: 04/04/2016 5:34:24 PM By: Montey Hora Entered By: Montey Hora on 04/04/2016 08:25:58 [name] ([id number]) -------------------------------------------------------------------------------- Nutrition Risk Assessment Details Patient Name: [name] Date of Service: 04/04/2016 8:00 AM Medical Record Number: [id number] Patient Account Number: [id number] Date of Birth/Sex: 1945-11-03 (71 y.o. Male) Treating RN: Montey Hora Primary Care Physician: Maurice Small Other Clinician: Referring Physician: London Pepper Treating Physician/Extender: Frann Rider in Treatment: 0 Height (in): 69 Weight (lbs): 249 Body Mass Index (BMI): 36.8 Nutrition Risk Assessment Items NUTRITION RISK SCREEN: I have an illness or condition that made me change the kind and/or 0 No amount of food I eat I eat fewer than two meals per day 0 No I eat few fruits and vegetables, or milk products 0 No I have three or more drinks of beer, liquor or wine almost every day 0 No I have tooth or mouth problems that make it hard for me to eat 0 No I don't always have enough money to buy the food I need 0 No I eat alone most of the time 0 No I take three or more different prescribed or over-the-counter drugs a 1 Yes day Without wanting to, I have lost or gained 10 pounds in the last six 0 No months I am not always physically able to shop, cook and/or feed myself 0 No Nutrition Protocols Good Risk Protocol 0 No interventions needed Moderate Risk Protocol Electronic Signature(s) Signed: 04/04/2016 5:34:24 PM By: Montey Hora Entered By: Montey Hora on 04/04/2016 08:25:39

## 2016-04-05 NOTE — Progress Notes (Signed)
Erik, [name] ([id number]) Visit Report for 04/04/2016 Allergy List Details Patient Name: Erik, [name] Date of Service: 04/04/2016 8:00 AM Medical Record Number: [id number] Patient Account Number: [id number] Date of Birth/Sex: [birth year] (71 y.o. Male) Treating RN: Montey Hora Primary Care Physician: Maurice Small Other Clinician: Referring Physician: London Pepper Treating Physician/Extender: Frann Rider in Treatment: 0 Allergies Active Allergies No Known Drug Allergies Allergy Notes Electronic Signature(s) Signed: 04/04/2016 5:34:24 PM By: Montey Hora Entered By: Montey Hora on 04/04/2016 08:23:38 Erik [name] ([id number]) -------------------------------------------------------------------------------- Arrival Information Details Patient Name: Erik [name] Date of Service: 04/04/2016 8:00 AM Medical Record Number: [id number] Patient Account Number: [id number] Date of Birth/Sex: 1945/08/14 (71 y.o. Male) Treating RN: Montey Hora Primary Care Physician: Maurice Small Other Clinician: Referring Physician: London Pepper Treating Physician/Extender: Frann Rider in Treatment: 0 Visit Information Patient Arrived: Ambulatory Arrival Time: 08:19 Accompanied By: self Transfer Assistance: None Patient Identification Verified: Yes Secondary Verification Process Yes Completed: Electronic Signature(s) Signed: 04/04/2016 5:34:24 PM By: Montey Hora Entered By: Montey Hora on 04/04/2016 08:19:57 Erik [name] ([id number]) -------------------------------------------------------------------------------- Clinic Level of Care Assessment Details Patient Name: Erik [name] Date of Service: 04/04/2016 8:00 AM Medical Record Number: [id number] Patient Account Number: [id number] Date of Birth/Sex: 1945/06/19 (71 y.o. Male) Treating RN: Montey Hora Primary Care Physician: Maurice Small Other Clinician: Referring Physician: London Pepper Treating  Physician/Extender: Frann Rider in Treatment: 0 Clinic Level of Care Assessment Items TOOL 1 Quantity Score []  - Use when EandM and Procedure is performed on INITIAL visit 0 ASSESSMENTS - Nursing Assessment / Reassessment X - General Physical Exam (combine w/ comprehensive assessment (listed just 1 20 below) when performed on new pt. evals) X - Comprehensive Assessment (HX, ROS, Risk Assessments, Wounds Hx, etc.) 1 25 ASSESSMENTS - Wound and Skin Assessment / Reassessment []  - Dermatologic / Skin Assessment (not related to wound area) 0 ASSESSMENTS - Ostomy and/or Continence Assessment and Care []  - Incontinence Assessment and Management 0 []  - Ostomy Care Assessment and Management (repouching, etc.) 0 PROCESS - Coordination of Care X - Simple Patient / Family Education for ongoing care 1 15 []  - Complex (extensive) Patient / Family Education for ongoing care 0 X - Staff obtains Programmer, systems, Records, Test Results / Process Orders 1 10 []  - Staff telephones HHA, Nursing Homes / Clarify orders / etc 0 []  - Routine Transfer to another Facility (non-emergent condition) 0 []  - Routine Hospital Admission (non-emergent condition) 0 X - New Admissions / Biomedical engineer / Ordering NPWT, Apligraf, etc. 1 15 []  - Emergency Hospital Admission (emergent condition) 0 PROCESS - Special Needs []  - Pediatric / Minor Patient Management 0 []  - Isolation Patient Management 0 Erik, [name] ([id number]) []  - Hearing / Language / Visual special needs 0 []  - Assessment of Community assistance (transportation, D/C planning, etc.) 0 []  - Additional assistance / Altered mentation 0 []  - Support Surface(s) Assessment (bed, cushion, seat, etc.) 0 INTERVENTIONS - Miscellaneous []  - External ear exam 0 []  - Patient Transfer (multiple staff / Civil Service fast streamer / Similar devices) 0 []  - Simple Staple / Suture removal (25 or less) 0 []  - Complex Staple / Suture removal (26 or more) 0 []  -  Hypo/Hyperglycemic Management (do not check if billed separately) 0 X - Ankle / Brachial Index (ABI) - do not check if billed separately 1 15 Has the patient been seen at the hospital within the last three years: Yes Total Score: 100 Level Of Care: New/Established - Level 3 Electronic Signature(s) Signed: 04/04/2016 5:34:24 PM  By: Montey Hora Entered By: Montey Hora on 04/04/2016 09:01:40 Erik [name] ([id number]) -------------------------------------------------------------------------------- Encounter Discharge Information Details Patient Name: Erik [name] Date of Service: 04/04/2016 8:00 AM Medical Record Number: [id number] Patient Account Number: [id number] Date of Birth/Sex: [birth year] (71 y.o. Male) Treating RN: Montey Hora Primary Care Physician: Maurice Small Other Clinician: Referring Physician: London Pepper Treating Physician/Extender: Frann Rider in Treatment: 0 Encounter Discharge Information Items Discharge Pain Level: 0 Discharge Condition: Stable Ambulatory Status: Ambulatory Discharge Destination: Home Transportation: Private Auto Accompanied By: self Schedule Follow-up Appointment: Yes Medication Reconciliation completed and provided to Patient/Care No Zadaya Cuadra: Provided on Clinical Summary of Care: 04/04/2016 Form Type Recipient Paper Patient TY Electronic Signature(s) Signed: 04/04/2016 9:27:18 AM By: Ruthine Dose Entered By: Ruthine Dose on 04/04/2016 09:27:18 Erik [name] ([id number]) -------------------------------------------------------------------------------- Lower Extremity Assessment Details Patient Name: Erik [name] Date of Service: 04/04/2016 8:00 AM Medical Record Number: [id number] Patient Account Number: [id number] Date of Birth/Sex: 1946/01/18 (71 y.o. Male) Treating RN: Montey Hora Primary Care Physician: Maurice Small Other Clinician: Referring Physician: London Pepper Treating Physician/Extender: Frann Rider in Treatment: 0 Edema Assessment Assessed: [Left: No] [Right: No] E[Left: dema] [Right: :] Calf Left: Right: Point of Measurement: 34 cm From Medial Instep 43.7 cm 43.5 cm Ankle Left: Right: Point of Measurement: 12 cm From Medial Instep 25.5 cm 25.3 cm Vascular Assessment Pulses: Dorsalis Pedis Palpable: [Left:Yes] [Right:Yes] Posterior Tibial Extremity colors, hair growth, and conditions: Extremity Color: [Left:Normal] [Right:Normal] Temperature of Extremity: [Left:Warm] [Right:Warm] Capillary Refill: [Left:< 3 seconds] [Right:< 3 seconds] Blood Pressure: Brachial: [Left:108] Dorsalis Pedis: 112 [Left:Dorsalis Pedis:] Ankle: Posterior Tibial: 118 [Left:Posterior Tibial: 1.09] Toe Nail Assessment Left: Right: Thick: Yes Yes Discolored: No No Deformed: No No Improper Length and Hygiene: Yes Yes Electronic Signature(s) Signed: 04/04/2016 5:34:24 PM By: Micheline Maze, [name] Moores ([id number]) Entered By: Montey Hora on 04/04/2016 [facility] ([id number]) -------------------------------------------------------------------------------- Multi Wound Chart Details Patient Name: Erik [name] Date of Service: 04/04/2016 8:00 AM Medical Record Number: [id number] Patient Account Number: [id number] Date of Birth/Sex: [birth year] (71 y.o. Male) Treating RN: Montey Hora Primary Care Physician: Maurice Small Other Clinician: Referring Physician: London Pepper Treating Physician/Extender: Frann Rider in Treatment: 0 Vital Signs Height(in): 69 Pulse(bpm): 76 Weight(lbs): 249 Blood Pressure 111/67 (mmHg): Body Mass Index(BMI): 37 Temperature(F): 97.8 Respiratory Rate 18 (breaths/min): Photos: [N/A:N/A] Wound Location: Left Knee - Medial N/A N/A Wounding Event: Trauma N/A N/A Primary Etiology: Trauma, Other N/A N/A Comorbid History: Coronary Artery Disease, N/A N/A Hypertension, Osteoarthritis Date Acquired: 02/20/2016 N/A N/A Weeks  of Treatment: 0 N/A N/A Wound Status: Open N/A N/A Measurements L x W x D 5.5x3x1.6 N/A N/A (cm) Area (cm) : 12.959 N/A N/A Volume (cm) : 20.735 N/A N/A % Reduction in Area: 0.00% N/A N/A % Reduction in Volume: 0.00% N/A N/A Classification: Full Thickness With N/A N/A Exposed Support Structures Exudate Amount: Large N/A N/A Exudate Type: Serous N/A N/A Exudate Color: amber N/A N/A Wound Margin: Flat and Intact N/A N/A Granulation Amount: None Present (0%) N/A N/A Erik [name] ([id number]) Necrotic Amount: Large (67-100%) N/A N/A Necrotic Tissue: Eschar, Adherent Slough N/A N/A Exposed Structures: Fascia: Yes N/A N/A Fat: Yes Tendon: No Muscle: No Joint: No Bone: No Epithelialization: None N/A N/A Debridement: Debridement [id number] N/A N/A [id number]) Pre-procedure 09:01 N/A N/A Verification/Time Out Taken: Pain Control: Lidocaine 4% Topical N/A N/A Solution Tissue Debrided: Fibrin/Slough, N/A N/A Subcutaneous Level: Skin/Subcutaneous N/A N/A Tissue Debridement Area (sq 16.5 N/A N/A cm): Instrument: Curette N/A N/A Bleeding: Minimum N/A N/A Hemostasis  Achieved: Pressure N/A N/A Procedural Pain: 0 N/A N/A Post Procedural Pain: 0 N/A N/A Debridement Treatment Procedure was tolerated N/A N/A Response: well Post Debridement 5.5x3x1.7 N/A N/A Measurements L x W x D (cm) Post Debridement 22.03 N/A N/A Volume: (cm) Periwound Skin Texture: Edema: Yes N/A N/A Excoriation: No Induration: No Callus: No Crepitus: No Fluctuance: No Friable: No Rash: No Scarring: No Periwound Skin Moist: Yes N/A N/A Moisture: Maceration: No Dry/Scaly: No Periwound Skin Color: Erythema: Yes N/A N/A Atrophie Blanche: No Cyanosis: No Ecchymosis: No Erik, [name] ([id number]) Hemosiderin Staining: No Mottled: No Pallor: No Rubor: No Erythema Location: Circumferential N/A N/A Temperature: No Abnormality N/A N/A Tenderness on No N/A N/A Palpation: Wound Preparation: Ulcer  Cleansing: N/A N/A Rinsed/Irrigated with Saline Topical Anesthetic Applied: Other: lidocaine 4% Procedures Performed: Debridement N/A N/A Treatment Notes Electronic Signature(s) Signed: 04/04/2016 9:12:45 AM By: Christin Fudge MD, FACS Entered By: Christin Fudge on 04/04/2016 09:12:45 Erik [name] ([id number]) -------------------------------------------------------------------------------- Multi-Disciplinary Care Plan Details Patient Name: Erik [name] Date of Service: 04/04/2016 8:00 AM Medical Record Number: [id number] Patient Account Number: [id number] Date of Birth/Sex: 09/18/1945 (71 y.o. Male) Treating RN: Montey Hora Primary Care Physician: Maurice Small Other Clinician: Referring Physician: London Pepper Treating Physician/Extender: Frann Rider in Treatment: 0 Active Inactive Abuse / Safety / Falls / Self Care Management Nursing Diagnoses: Potential for falls Goals: Patient will remain injury free Date Initiated: 04/04/2016 Goal Status: Active Interventions: Assess fall risk on admission and as needed Notes: Orientation to the Wound Care Program Nursing Diagnoses: Knowledge deficit related to the wound healing center program Goals: Patient/caregiver will verbalize understanding of the [place] Program Date Initiated: 04/04/2016 Goal Status: Active Interventions: Provide education on orientation to the wound center Notes: Wound/Skin Impairment Nursing Diagnoses: Impaired tissue integrity Goals: Patient/caregiver will verbalize understanding of skin care regimen Date Initiated: 04/04/2016 Erik [name] ([id number]) Goal Status: Active Ulcer/skin breakdown will have a volume reduction of 30% by week 4 Date Initiated: 04/04/2016 Goal Status: Active Ulcer/skin breakdown will have a volume reduction of 50% by week 8 Date Initiated: 04/04/2016 Goal Status: Active Ulcer/skin breakdown will have a volume reduction of 80% by week 12 Date  Initiated: 04/04/2016 Goal Status: Active Ulcer/skin breakdown will heal within 14 weeks Date Initiated: 04/04/2016 Goal Status: Active Interventions: Assess patient/caregiver ability to obtain necessary supplies Assess patient/caregiver ability to perform ulcer/skin care regimen upon admission and as needed Assess ulceration(s) every visit Notes: Electronic Signature(s) Signed: 04/04/2016 5:34:24 PM By: Montey Hora Entered By: Montey Hora on 04/04/2016 08:57:43 Erik [name] ([id number]) -------------------------------------------------------------------------------- Pain Assessment Details Patient Name: Erik [name] Date of Service: 04/04/2016 8:00 AM Medical Record Number: [id number] Patient Account Number: [id number] Date of Birth/Sex: [birth year] (71 y.o. Male) Treating RN: Montey Hora Primary Care Physician: Maurice Small Other Clinician: Referring Physician: London Pepper Treating Physician/Extender: Frann Rider in Treatment: 0 Active Problems Location of Pain Severity and Description of Pain Patient Has Paino No Site Locations Pain Management and Medication Current Pain Management: Notes Topical or injectable lidocaine is offered to patient for acute pain when surgical debridement is performed. If needed, Patient is instructed to use over the counter pain medication for the following 24-48 hours after debridement. Wound care MDs do not prescribed pain medications. Patient has chronic pain or uncontrolled pain. Patient has been instructed to make an appointment with their Primary Care Physician for pain management. Electronic Signature(s) Signed: 04/04/2016 5:34:24 PM By: Montey Hora Entered By: Montey Hora on 04/04/2016 08:21:31 Erik [name] ([id number]) -------------------------------------------------------------------------------- Patient/Caregiver Education  Details Patient Name: Erik, [name] Date of Service: 04/04/2016 8:00 AM Medical Record  Number: [id number] Patient Account Number: [id number] Date of Birth/Gender: 06-23-1945 (71 y.o. Male) Treating RN: Montey Hora Primary Care Physician: Maurice Small Other Clinician: Referring Physician: London Pepper Treating Physician/Extender: Frann Rider in Treatment: 0 Education Assessment Education Provided To: Patient Education Topics Provided Wound/Skin Impairment: Handouts: Other: wound care as ordered Methods: Demonstration, Explain/Verbal Responses: State content correctly Electronic Signature(s) Signed: 04/04/2016 5:34:24 PM By: Montey Hora Entered By: Montey Hora on 04/04/2016 08:59:30 Erik [name] ([id number]) -------------------------------------------------------------------------------- Wound Assessment Details Patient Name: Erik [name] Date of Service: 04/04/2016 8:00 AM Medical Record Number: [id number] Patient Account Number: [id number] Date of Birth/Sex: [birth year] (71 y.o. Male) Treating RN: Montey Hora Primary Care Physician: Maurice Small Other Clinician: Referring Physician: London Pepper Treating Physician/Extender: Frann Rider in Treatment: 0 Wound Status Wound Number: 1 Primary Trauma, Other Etiology: Wound Location: Left Knee - Medial Wound Open Wounding Event: Trauma Status: Date Acquired: 02/20/2016 Comorbid Coronary Artery Disease, Weeks Of Treatment: 0 History: Hypertension, Osteoarthritis Clustered Wound: No Photos Wound Measurements Length: (cm) 5.5 Width: (cm) 3 Depth: (cm) 1.6 Area: (cm) 12.959 Volume: (cm) 20.735 % Reduction in Area: 0% % Reduction in Volume: 0% Epithelialization: None Tunneling: No Undermining: No Wound Description Full Thickness With Exposed Classification: Support Structures Wound Margin: Flat and Intact Exudate Large Amount: Exudate Type: Serous Exudate Color: amber Wound Bed Granulation Amount: None Present (0%) Exposed Structure Necrotic Amount: Large  (67-100%) Fascia Exposed: Yes Necrotic Quality: Eschar, Adherent Slough Fat Layer Exposed: Yes Erik, [name] ([id number]) Tendon Exposed: No Muscle Exposed: No Joint Exposed: No Bone Exposed: No Periwound Skin Texture Texture Color No Abnormalities Noted: No No Abnormalities Noted: No Callus: No Atrophie Blanche: No Crepitus: No Cyanosis: No Excoriation: No Ecchymosis: No Fluctuance: No Erythema: Yes Friable: No Erythema Location: Circumferential Induration: No Hemosiderin Staining: No Localized Edema: Yes Mottled: No Rash: No Pallor: No Scarring: No Rubor: No Moisture Temperature / Pain No Abnormalities Noted: No Temperature: No Abnormality Dry / Scaly: No Maceration: No Moist: Yes Wound Preparation Ulcer Cleansing: Rinsed/Irrigated with Saline Topical Anesthetic Applied: Other: lidocaine 4%, Treatment Notes Wound #1 (Left, Medial Knee) 1. Cleansed with: Clean wound with Normal Saline 2. Anesthetic Topical Lidocaine 4% cream to wound bed prior to debridement 4. Dressing Applied: Santyl Ointment 5. Secondary Dressing Applied Dry [place] Signature(s) Signed: 04/04/2016 5:34:24 PM By: Montey Hora Entered By: Montey Hora on 04/04/2016 08:58:12 Erik [name] ([id number]) -------------------------------------------------------------------------------- [place] Details Patient Name: Erik [name] Date of Service: 04/04/2016 8:00 AM Medical Record Number: [id number] Patient Account Number: [id number] Date of Birth/Sex: November 14, 1945 (71 y.o. Male) Treating RN: Montey Hora Primary Care Physician: Maurice Small Other Clinician: Referring Physician: London Pepper Treating Physician/Extender: Frann Rider in Treatment: 0 Vital Signs Time Taken: 08:21 Temperature (F): 97.8 Height (in): 69 Pulse (bpm): 76 Source: Measured Respiratory Rate (breaths/min): 18 Weight (lbs): 249 Blood Pressure (mmHg): 111/67 Source:  Measured Reference Range: 80 - 120 mg / dl Body Mass Index (BMI): 36.8 Electronic Signature(s) Signed: 04/04/2016 5:34:24 PM By: Montey Hora Entered By: Montey Hora on 04/04/2016 08:23:18

## 2016-04-11 ENCOUNTER — Ambulatory Visit: Payer: Medicare Other | Admitting: Surgery

## 2016-04-12 DIAGNOSIS — Z6839 Body mass index (BMI) 39.0-39.9, adult: Secondary | ICD-10-CM | POA: Diagnosis not present

## 2016-04-12 DIAGNOSIS — Z125 Encounter for screening for malignant neoplasm of prostate: Secondary | ICD-10-CM | POA: Diagnosis not present

## 2016-04-12 DIAGNOSIS — I10 Essential (primary) hypertension: Secondary | ICD-10-CM | POA: Diagnosis not present

## 2016-04-12 DIAGNOSIS — R7303 Prediabetes: Secondary | ICD-10-CM | POA: Diagnosis not present

## 2016-04-12 DIAGNOSIS — Z0000 Encounter for general adult medical examination without abnormal findings: Secondary | ICD-10-CM | POA: Diagnosis not present

## 2016-04-12 DIAGNOSIS — F321 Major depressive disorder, single episode, moderate: Secondary | ICD-10-CM | POA: Diagnosis not present

## 2016-04-18 ENCOUNTER — Encounter: Payer: Medicare Other | Admitting: Surgery

## 2016-04-18 ENCOUNTER — Other Ambulatory Visit: Payer: Self-pay | Admitting: Interventional Cardiology

## 2016-04-18 DIAGNOSIS — S71112A Laceration without foreign body, left thigh, initial encounter: Secondary | ICD-10-CM | POA: Diagnosis not present

## 2016-04-18 DIAGNOSIS — I2510 Atherosclerotic heart disease of native coronary artery without angina pectoris: Secondary | ICD-10-CM | POA: Diagnosis not present

## 2016-04-18 DIAGNOSIS — S71102A Unspecified open wound, left thigh, initial encounter: Secondary | ICD-10-CM | POA: Diagnosis not present

## 2016-04-18 DIAGNOSIS — L97122 Non-pressure chronic ulcer of left thigh with fat layer exposed: Secondary | ICD-10-CM | POA: Diagnosis not present

## 2016-04-18 DIAGNOSIS — M1990 Unspecified osteoarthritis, unspecified site: Secondary | ICD-10-CM | POA: Diagnosis not present

## 2016-04-18 DIAGNOSIS — I10 Essential (primary) hypertension: Secondary | ICD-10-CM | POA: Diagnosis not present

## 2016-04-18 DIAGNOSIS — Z7982 Long term (current) use of aspirin: Secondary | ICD-10-CM | POA: Diagnosis not present

## 2016-04-18 NOTE — Telephone Encounter (Signed)
Rx refill sent to pharmacy. 
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
## 2016-04-19 NOTE — Progress Notes (Addendum)
Erik [name] ([id number]) Visit Report for 04/18/2016 Arrival Information Details Patient Name: Erik [name], Erik [name] Date of Service: 04/18/2016 11:00 AM Medical Record Number: [id number] Patient Account Number: [id number] Date of Birth/Sex: Dec 22, 1945 (70 y.o. Male) Treating RN: Montey Hora Primary Care Albaro Deviney: Maurice Small Other Clinician: Referring Ahnesti Townsend: Maurice Small Treating Ahmaya Ostermiller/Extender: Frann Rider in Treatment: 2 Visit Information History Since Last Visit Added or deleted any medications: No Patient Arrived: Ambulatory Any new allergies or adverse reactions: No Arrival Time: 11:25 Had a fall or experienced change in No Accompanied By: self activities of daily living that may affect Transfer Assistance: None risk of falls: Patient Identification Verified: Yes Signs or symptoms of abuse/neglect since last No Secondary Verification Process Yes visito Completed: Hospitalized since last visit: No Has Dressing in Place as Prescribed: Yes Pain Present Now: No Electronic Signature(s) Signed: 04/18/2016 5:04:44 PM By: Montey Hora Entered By: Montey Hora on 04/18/2016 11:25:41 Erik [name] ([id number]) -------------------------------------------------------------------------------- Encounter Discharge Information Details Patient Name: Erik [name] Date of Service: 04/18/2016 11:00 AM Medical Record Number: [id number] Patient Account Number: [id number] Date of Birth/Sex: 10-19-1945 (71 y.o. Male) Treating RN: Montey Hora Primary Care Treyven Lafauci: Maurice Small Other Clinician: Referring Mihail Prettyman: Maurice Small Treating Azyriah Nevins/Extender: Frann Rider in Treatment: 2 Encounter Discharge Information Items Discharge Pain Level: 0 Discharge Condition: Stable Ambulatory Status: Ambulatory Discharge Destination: Home Transportation: Private Auto Accompanied By: self Schedule Follow-up Appointment: No Medication Reconciliation completed and provided to  Patient/Care No Slaton Reaser: Provided on Clinical Summary of Care: 04/18/2016 Form Type Recipient Paper Patient TY Electronic Signature(s) Signed: 04/18/2016 11:59:10 AM By: Ruthine Dose Entered By: Ruthine Dose on 04/18/2016 11:59:10 Erik [name] ([id number]) -------------------------------------------------------------------------------- Lower Extremity Assessment Details Patient Name: Erik [name] Date of Service: 04/18/2016 11:00 AM Medical Record Number: [id number] Patient Account Number: [id number] Date of Birth/Sex: 03/21/1946 (71 y.o. Male) Treating RN: Montey Hora Primary Care Carlas Vandyne: Maurice Small Other Clinician: Referring Jameila Keeny: Maurice Small Treating Melvinia Ashby/Extender: Frann Rider in Treatment: 2 Edema Assessment Assessed: [Left: No] [Right: No] Edema: [Left: Ye] [Right: s] Vascular Assessment Pulses: Dorsalis Pedis Palpable: [Left:Yes] Posterior Tibial Extremity colors, hair growth, and conditions: Extremity Color: [Left:Normal] Hair Growth on Extremity: [Left:No] Temperature of Extremity: [Left:Warm] Capillary Refill: [Left:< 3 seconds] Electronic Signature(s) Signed: 04/18/2016 5:04:44 PM By: Montey Hora Entered By: Montey Hora on 04/18/2016 11:35:26 Erik [name] ([id number]) -------------------------------------------------------------------------------- Multi Wound Chart Details Patient Name: Erik [name] Date of Service: 04/18/2016 11:00 AM Medical Record Number: [id number] Patient Account Number: [id number] Date of Birth/Sex: [birth year] (71 y.o. Male) Treating RN: Montey Hora Primary Care Percell Lamboy: Maurice Small Other Clinician: Referring Vermell Madrid: Maurice Small Treating Estefano Victory/Extender: Frann Rider in Treatment: 2 Vital Signs Height(in): 69 Pulse(bpm): 69 Weight(lbs): 249 Blood Pressure 107/66 (mmHg): Body Mass Index(BMI): 37 Temperature(F): 97.9 Respiratory Rate 18 (breaths/min): Photos: [N/A:N/A] Wound  Location: Left Upper Leg - Medial N/A N/A Wounding Event: Trauma N/A N/A Primary Etiology: Trauma, Other N/A N/A Comorbid History: Coronary Artery Disease, N/A N/A Hypertension, Osteoarthritis Date Acquired: 02/20/2016 N/A N/A Weeks of Treatment: 2 N/A N/A Wound Status: Open N/A N/A Measurements L x W x D 5x2.4x0.8 N/A N/A (cm) Area (cm) : 9.425 N/A N/A Volume (cm) : 7.54 N/A N/A % Reduction in Area: 27.30% N/A N/A % Reduction in Volume: 63.60% N/A N/A Position 1 (o'clock): 12 Maximum Distance 1 2.6 (cm): Tunneling: Yes N/A N/A Classification: Full Thickness With N/A N/A Exposed Support Structures Exudate Amount: Large N/A N/A BANNING, DEMILLE ([id number]) Exudate Type: Serous N/A N/A Exudate Color: amber N/A N/A Wound Margin: Flat  and Intact N/A N/A Granulation Amount: Large (67-100%) N/A N/A Granulation Quality: Red, Hyper-granulation N/A N/A Necrotic Amount: Small (1-33%) N/A N/A Exposed Structures: Fascia: Yes N/A N/A Fat Layer (Subcutaneous Tissue) Exposed: Yes Tendon: No Muscle: No Joint: No Bone: No Epithelialization: None N/A N/A Debridement: Debridement [id number] N/A N/A [id number]) Pre-procedure 11:45 N/A N/A Verification/Time Out Taken: Pain Control: Lidocaine 4% Topical N/A N/A Solution Tissue Debrided: Fibrin/Slough, N/A N/A Subcutaneous Level: Skin/Subcutaneous N/A N/A Tissue Debridement Area (sq 12 N/A N/A cm): Instrument: Curette N/A N/A Bleeding: Minimum N/A N/A Hemostasis Achieved: Pressure N/A N/A Procedural Pain: 0 N/A N/A Post Procedural Pain: 0 N/A N/A Debridement Treatment Procedure was tolerated N/A N/A Response: well Post Debridement 5x2.4x0.8 N/A N/A Measurements L x W x D (cm) Post Debridement 7.54 N/A N/A Volume: (cm) Periwound Skin Texture: Excoriation: No N/A N/A Induration: No Callus: No Crepitus: No Rash: No Scarring: No Periwound Skin Maceration: No N/A N/A Moisture: Dry/Scaly: No Periwound Skin Color: Erythema: Yes  N/A N/A Atrophie Blanche: No Cyanosis: No ANTONI, WHITMILL ([id number]) Ecchymosis: No Hemosiderin Staining: No Mottled: No Pallor: No Rubor: No Erythema Location: Circumferential N/A N/A Temperature: No Abnormality N/A N/A Tenderness on No N/A N/A Palpation: Wound Preparation: Ulcer Cleansing: N/A N/A Rinsed/Irrigated with Saline Topical Anesthetic Applied: Other: lidocaine 4% Procedures Performed: Debridement N/A N/A Treatment Notes Electronic Signature(s) Signed: 04/18/2016 11:57:11 AM By: Christin Fudge MD, FACS Entered By: Christin Fudge on 04/18/2016 11:57:10 Erik [name] ([id number]) -------------------------------------------------------------------------------- Multi-Disciplinary Care Plan Details Patient Name: Erik [name] Date of Service: 04/18/2016 11:00 AM Medical Record Number: [id number] Patient Account Number: [id number] Date of Birth/Sex: Dec 20, 1945 (71 y.o. Male) Treating RN: Montey Hora Primary Care Dauntae Derusha: Maurice Small Other Clinician: Referring Jacarius Handel: Maurice Small Treating Marcianne Ozbun/Extender: Frann Rider in Treatment: 2 Active Inactive Electronic Signature(s) Signed: 04/23/2016 5:06:05 PM By: Gretta Cool RN, BSN, Kim RN, BSN Signed: 04/23/2016 5:15:42 PM By: Montey Hora Previous Signature: 04/18/2016 5:04:44 PM Version By: Montey Hora Entered By: Gretta Cool RN, BSN, Kim on 04/23/2016 17:06:04 Erik [name] ([id number]) -------------------------------------------------------------------------------- Pain Assessment Details Patient Name: Erik [name] Date of Service: 04/18/2016 11:00 AM Medical Record Number: [id number] Patient Account Number: [id number] Date of Birth/Sex: 1945/04/15 (71 y.o. Male) Treating RN: Montey Hora Primary Care Raylon Lamson: Maurice Small Other Clinician: Referring Keerthi Hazell: Maurice Small Treating Ayushi Pla/Extender: Frann Rider in Treatment: 2 Active Problems Location of Pain Severity and Description of  Pain Patient Has Paino No Site Locations Pain Management and Medication Current Pain Management: Notes Topical or injectable lidocaine is offered to patient for acute pain when surgical debridement is performed. If needed, Patient is instructed to use over the counter pain medication for the following 24-48 hours after debridement. Wound care MDs do not prescribed pain medications. Patient has chronic pain or uncontrolled pain. Patient has been instructed to make an appointment with their Primary Care Physician for pain management. Electronic Signature(s) Signed: 04/18/2016 5:04:44 PM By: Montey Hora Entered By: Montey Hora on 04/18/2016 11:25:47 Erik [name] ([id number]) -------------------------------------------------------------------------------- Patient/Caregiver Education Details Patient Name: Erik [name] Date of Service: 04/18/2016 11:00 AM Medical Record Number: [id number] Patient Account Number: [id number] Date of Birth/Gender: [birth year] (71 y.o. Male) Treating RN: Montey Hora Primary Care Physician: Maurice Small Other Clinician: Referring Physician: Maurice Small Treating Physician/Extender: Frann Rider in Treatment: 2 Education Assessment Education Provided To: Patient Education Topics Provided Wound/Skin Impairment: Handouts: Other: wound care as ordered Methods: Demonstration, Explain/Verbal Responses: State content correctly Electronic Signature(s) Signed: 04/18/2016 5:04:44 PM By: Montey Hora Entered By: Montey Hora on 04/18/2016 11:45:27 Domenic Moras,  Marcello Moores ([id number]) -------------------------------------------------------------------------------- Wound Assessment Details Patient Name: AKSHATH, SAPIO Date of Service: 04/18/2016 11:00 AM Medical Record Number: [id number] Patient Account Number: [id number] Date of Birth/Sex: 1945/06/17 (71 y.o. Male) Treating RN: Montey Hora Primary Care Peyson Delao: Maurice Small Other Clinician: Referring  Aalyah Mansouri: Maurice Small Treating Nazir Hacker/Extender: Frann Rider in Treatment: 2 Wound Status Wound Number: 1 Primary Trauma, Other Etiology: Wound Location: Left Upper Leg - Medial Wound Open Wounding Event: Trauma Status: Date Acquired: 02/20/2016 Comorbid Coronary Artery Disease, Weeks Of Treatment: 2 History: Hypertension, Osteoarthritis Clustered Wound: No Photos Wound Measurements Length: (cm) 5 Width: (cm) 2.4 Depth: (cm) 0.8 Area: (cm) 9.425 Volume: (cm) 7.54 % Reduction in Area: 27.3% % Reduction in Volume: 63.6% Epithelialization: None Tunneling: Yes Position (o'clock): 12 Maximum Distance: (cm) 2.6 Undermining: No Wound Description Full Thickness With Exposed Classification: Support Structures Wound Margin: Flat and Intact Exudate Large Amount: Exudate Type: Serous Exudate Color: amber Wound Bed DERAN, ABBASI ([id number]) Granulation Amount: Large (67-100%) Exposed Structure Granulation Quality: Red, Hyper-granulation Fascia Exposed: Yes Necrotic Amount: Small (1-33%) Fat Layer (Subcutaneous Tissue) Exposed: Yes Necrotic Quality: Adherent Slough Tendon Exposed: No Muscle Exposed: No Joint Exposed: No Bone Exposed: No Periwound Skin Texture Texture Color No Abnormalities Noted: No No Abnormalities Noted: No Callus: No Atrophie Blanche: No Crepitus: No Cyanosis: No Excoriation: No Ecchymosis: No Induration: No Erythema: Yes Rash: No Erythema Location: Circumferential Scarring: No Hemosiderin Staining: No Mottled: No Moisture Pallor: No No Abnormalities Noted: No Rubor: No Dry / Scaly: No Maceration: No Temperature / Pain Temperature: No Abnormality Wound Preparation Ulcer Cleansing: Rinsed/Irrigated with Saline Topical Anesthetic Applied: Other: lidocaine 4%, Electronic Signature(s) Signed: 04/18/2016 5:04:44 PM By: Montey Hora Entered By: Montey Hora on 04/18/2016 11:35:03 Erik [name]  ([id number]) -------------------------------------------------------------------------------- [place] Details Patient Name: Erik [name] Date of Service: 04/18/2016 11:00 AM Medical Record Number: [id number] Patient Account Number: [id number] Date of Birth/Sex: 06/25/1945 (71 y.o. Male) Treating RN: Montey Hora Primary Care Xoie Kreuser: Maurice Small Other Clinician: Referring Arista Kettlewell: Maurice Small Treating Breunna Nordmann/Extender: Frann Rider in Treatment: 2 Vital Signs Time Taken: 11:26 Temperature (F): 97.9 Height (in): 69 Pulse (bpm): 69 Weight (lbs): 249 Respiratory Rate (breaths/min): 18 Body Mass Index (BMI): 36.8 Blood Pressure (mmHg): 107/66 Reference Range: 80 - 120 mg / dl Electronic Signature(s) Signed: 04/18/2016 5:04:44 PM By: Montey Hora Entered By: Montey Hora on 04/18/2016 11:27:38

## 2016-04-24 ENCOUNTER — Encounter (HOSPITAL_BASED_OUTPATIENT_CLINIC_OR_DEPARTMENT_OTHER): Payer: Medicare Other | Attending: Surgery

## 2016-04-24 DIAGNOSIS — Z791 Long term (current) use of non-steroidal anti-inflammatories (NSAID): Secondary | ICD-10-CM | POA: Diagnosis not present

## 2016-04-24 DIAGNOSIS — Z79891 Long term (current) use of opiate analgesic: Secondary | ICD-10-CM | POA: Insufficient documentation

## 2016-04-24 DIAGNOSIS — I10 Essential (primary) hypertension: Secondary | ICD-10-CM | POA: Insufficient documentation

## 2016-04-24 DIAGNOSIS — E785 Hyperlipidemia, unspecified: Secondary | ICD-10-CM | POA: Insufficient documentation

## 2016-04-24 DIAGNOSIS — Z87891 Personal history of nicotine dependence: Secondary | ICD-10-CM | POA: Diagnosis not present

## 2016-04-24 DIAGNOSIS — K219 Gastro-esophageal reflux disease without esophagitis: Secondary | ICD-10-CM | POA: Insufficient documentation

## 2016-04-24 DIAGNOSIS — I2510 Atherosclerotic heart disease of native coronary artery without angina pectoris: Secondary | ICD-10-CM | POA: Insufficient documentation

## 2016-04-24 DIAGNOSIS — Z79899 Other long term (current) drug therapy: Secondary | ICD-10-CM | POA: Diagnosis not present

## 2016-04-24 DIAGNOSIS — S81002A Unspecified open wound, left knee, initial encounter: Secondary | ICD-10-CM | POA: Insufficient documentation

## 2016-04-24 DIAGNOSIS — E669 Obesity, unspecified: Secondary | ICD-10-CM | POA: Insufficient documentation

## 2016-04-24 DIAGNOSIS — Z7982 Long term (current) use of aspirin: Secondary | ICD-10-CM | POA: Diagnosis not present

## 2016-04-24 DIAGNOSIS — Z7952 Long term (current) use of systemic steroids: Secondary | ICD-10-CM | POA: Insufficient documentation

## 2016-05-01 ENCOUNTER — Encounter (HOSPITAL_BASED_OUTPATIENT_CLINIC_OR_DEPARTMENT_OTHER): Payer: Medicare Other | Attending: Surgery

## 2016-05-01 DIAGNOSIS — I2510 Atherosclerotic heart disease of native coronary artery without angina pectoris: Secondary | ICD-10-CM | POA: Diagnosis not present

## 2016-05-01 DIAGNOSIS — L97112 Non-pressure chronic ulcer of right thigh with fat layer exposed: Secondary | ICD-10-CM | POA: Insufficient documentation

## 2016-05-01 DIAGNOSIS — I10 Essential (primary) hypertension: Secondary | ICD-10-CM | POA: Diagnosis not present

## 2016-05-01 DIAGNOSIS — S71102A Unspecified open wound, left thigh, initial encounter: Secondary | ICD-10-CM | POA: Diagnosis not present

## 2016-05-03 DIAGNOSIS — S71112A Laceration without foreign body, left thigh, initial encounter: Secondary | ICD-10-CM | POA: Diagnosis not present

## 2016-05-03 DIAGNOSIS — Z4800 Encounter for change or removal of nonsurgical wound dressing: Secondary | ICD-10-CM | POA: Diagnosis not present

## 2016-05-06 DIAGNOSIS — Z4800 Encounter for change or removal of nonsurgical wound dressing: Secondary | ICD-10-CM | POA: Diagnosis not present

## 2016-05-06 DIAGNOSIS — S71112A Laceration without foreign body, left thigh, initial encounter: Secondary | ICD-10-CM | POA: Diagnosis not present

## 2016-05-08 DIAGNOSIS — I2510 Atherosclerotic heart disease of native coronary artery without angina pectoris: Secondary | ICD-10-CM | POA: Diagnosis not present

## 2016-05-08 DIAGNOSIS — L97112 Non-pressure chronic ulcer of right thigh with fat layer exposed: Secondary | ICD-10-CM | POA: Diagnosis not present

## 2016-05-08 DIAGNOSIS — I10 Essential (primary) hypertension: Secondary | ICD-10-CM | POA: Diagnosis not present

## 2016-05-08 DIAGNOSIS — S71102A Unspecified open wound, left thigh, initial encounter: Secondary | ICD-10-CM | POA: Diagnosis not present

## 2016-05-10 DIAGNOSIS — Z4800 Encounter for change or removal of nonsurgical wound dressing: Secondary | ICD-10-CM | POA: Diagnosis not present

## 2016-05-10 DIAGNOSIS — S71112A Laceration without foreign body, left thigh, initial encounter: Secondary | ICD-10-CM | POA: Diagnosis not present

## 2016-05-13 DIAGNOSIS — S71112A Laceration without foreign body, left thigh, initial encounter: Secondary | ICD-10-CM | POA: Diagnosis not present

## 2016-05-13 DIAGNOSIS — Z4800 Encounter for change or removal of nonsurgical wound dressing: Secondary | ICD-10-CM | POA: Diagnosis not present

## 2016-05-15 DIAGNOSIS — I2510 Atherosclerotic heart disease of native coronary artery without angina pectoris: Secondary | ICD-10-CM | POA: Diagnosis not present

## 2016-05-15 DIAGNOSIS — S71102A Unspecified open wound, left thigh, initial encounter: Secondary | ICD-10-CM | POA: Diagnosis not present

## 2016-05-15 DIAGNOSIS — I10 Essential (primary) hypertension: Secondary | ICD-10-CM | POA: Diagnosis not present

## 2016-05-15 DIAGNOSIS — L97112 Non-pressure chronic ulcer of right thigh with fat layer exposed: Secondary | ICD-10-CM | POA: Diagnosis not present

## 2016-05-17 DIAGNOSIS — Z4800 Encounter for change or removal of nonsurgical wound dressing: Secondary | ICD-10-CM | POA: Diagnosis not present

## 2016-05-17 DIAGNOSIS — S71112A Laceration without foreign body, left thigh, initial encounter: Secondary | ICD-10-CM | POA: Diagnosis not present

## 2016-05-20 DIAGNOSIS — Z4800 Encounter for change or removal of nonsurgical wound dressing: Secondary | ICD-10-CM | POA: Diagnosis not present

## 2016-05-20 DIAGNOSIS — S71112A Laceration without foreign body, left thigh, initial encounter: Secondary | ICD-10-CM | POA: Diagnosis not present

## 2016-05-22 DIAGNOSIS — I10 Essential (primary) hypertension: Secondary | ICD-10-CM | POA: Diagnosis not present

## 2016-05-22 DIAGNOSIS — S71102A Unspecified open wound, left thigh, initial encounter: Secondary | ICD-10-CM | POA: Diagnosis not present

## 2016-05-22 DIAGNOSIS — L97112 Non-pressure chronic ulcer of right thigh with fat layer exposed: Secondary | ICD-10-CM | POA: Diagnosis not present

## 2016-05-22 DIAGNOSIS — I2510 Atherosclerotic heart disease of native coronary artery without angina pectoris: Secondary | ICD-10-CM | POA: Diagnosis not present

## 2016-05-24 DIAGNOSIS — S71112A Laceration without foreign body, left thigh, initial encounter: Secondary | ICD-10-CM | POA: Diagnosis not present

## 2016-05-24 DIAGNOSIS — Z4800 Encounter for change or removal of nonsurgical wound dressing: Secondary | ICD-10-CM | POA: Diagnosis not present

## 2016-05-27 DIAGNOSIS — S71112A Laceration without foreign body, left thigh, initial encounter: Secondary | ICD-10-CM | POA: Diagnosis not present

## 2016-05-27 DIAGNOSIS — Z4800 Encounter for change or removal of nonsurgical wound dressing: Secondary | ICD-10-CM | POA: Diagnosis not present

## 2016-05-29 ENCOUNTER — Encounter (HOSPITAL_BASED_OUTPATIENT_CLINIC_OR_DEPARTMENT_OTHER): Payer: Medicare Other | Attending: Surgery

## 2016-05-29 DIAGNOSIS — Z87891 Personal history of nicotine dependence: Secondary | ICD-10-CM | POA: Diagnosis not present

## 2016-05-29 DIAGNOSIS — S71112A Laceration without foreign body, left thigh, initial encounter: Secondary | ICD-10-CM | POA: Insufficient documentation

## 2016-05-29 DIAGNOSIS — I10 Essential (primary) hypertension: Secondary | ICD-10-CM | POA: Diagnosis not present

## 2016-05-29 DIAGNOSIS — L97122 Non-pressure chronic ulcer of left thigh with fat layer exposed: Secondary | ICD-10-CM | POA: Insufficient documentation

## 2016-05-29 DIAGNOSIS — I2510 Atherosclerotic heart disease of native coronary artery without angina pectoris: Secondary | ICD-10-CM | POA: Diagnosis not present

## 2016-05-29 DIAGNOSIS — S71102A Unspecified open wound, left thigh, initial encounter: Secondary | ICD-10-CM | POA: Diagnosis not present

## 2016-05-29 DIAGNOSIS — X58XXXA Exposure to other specified factors, initial encounter: Secondary | ICD-10-CM | POA: Diagnosis not present

## 2016-05-31 DIAGNOSIS — S71112A Laceration without foreign body, left thigh, initial encounter: Secondary | ICD-10-CM | POA: Diagnosis not present

## 2016-05-31 DIAGNOSIS — Z4800 Encounter for change or removal of nonsurgical wound dressing: Secondary | ICD-10-CM | POA: Diagnosis not present

## 2016-06-03 DIAGNOSIS — S71112A Laceration without foreign body, left thigh, initial encounter: Secondary | ICD-10-CM | POA: Diagnosis not present

## 2016-06-03 DIAGNOSIS — Z4800 Encounter for change or removal of nonsurgical wound dressing: Secondary | ICD-10-CM | POA: Diagnosis not present

## 2016-06-05 DIAGNOSIS — S71112A Laceration without foreign body, left thigh, initial encounter: Secondary | ICD-10-CM | POA: Diagnosis not present

## 2016-06-05 DIAGNOSIS — Z87891 Personal history of nicotine dependence: Secondary | ICD-10-CM | POA: Diagnosis not present

## 2016-06-05 DIAGNOSIS — S71102A Unspecified open wound, left thigh, initial encounter: Secondary | ICD-10-CM | POA: Diagnosis not present

## 2016-06-05 DIAGNOSIS — I10 Essential (primary) hypertension: Secondary | ICD-10-CM | POA: Diagnosis not present

## 2016-06-05 DIAGNOSIS — I2510 Atherosclerotic heart disease of native coronary artery without angina pectoris: Secondary | ICD-10-CM | POA: Diagnosis not present

## 2016-06-05 DIAGNOSIS — L97122 Non-pressure chronic ulcer of left thigh with fat layer exposed: Secondary | ICD-10-CM | POA: Diagnosis not present

## 2016-06-07 DIAGNOSIS — Z4800 Encounter for change or removal of nonsurgical wound dressing: Secondary | ICD-10-CM | POA: Diagnosis not present

## 2016-06-07 DIAGNOSIS — H43811 Vitreous degeneration, right eye: Secondary | ICD-10-CM | POA: Diagnosis not present

## 2016-06-07 DIAGNOSIS — S71112A Laceration without foreign body, left thigh, initial encounter: Secondary | ICD-10-CM | POA: Diagnosis not present

## 2016-06-10 DIAGNOSIS — Z4800 Encounter for change or removal of nonsurgical wound dressing: Secondary | ICD-10-CM | POA: Diagnosis not present

## 2016-06-10 DIAGNOSIS — S71112A Laceration without foreign body, left thigh, initial encounter: Secondary | ICD-10-CM | POA: Diagnosis not present

## 2016-06-12 DIAGNOSIS — I2510 Atherosclerotic heart disease of native coronary artery without angina pectoris: Secondary | ICD-10-CM | POA: Diagnosis not present

## 2016-06-12 DIAGNOSIS — S71112A Laceration without foreign body, left thigh, initial encounter: Secondary | ICD-10-CM | POA: Diagnosis not present

## 2016-06-12 DIAGNOSIS — L97122 Non-pressure chronic ulcer of left thigh with fat layer exposed: Secondary | ICD-10-CM | POA: Diagnosis not present

## 2016-06-12 DIAGNOSIS — S71102A Unspecified open wound, left thigh, initial encounter: Secondary | ICD-10-CM | POA: Diagnosis not present

## 2016-06-12 DIAGNOSIS — Z87891 Personal history of nicotine dependence: Secondary | ICD-10-CM | POA: Diagnosis not present

## 2016-06-12 DIAGNOSIS — I10 Essential (primary) hypertension: Secondary | ICD-10-CM | POA: Diagnosis not present

## 2016-06-14 DIAGNOSIS — S71112A Laceration without foreign body, left thigh, initial encounter: Secondary | ICD-10-CM | POA: Diagnosis not present

## 2016-06-14 DIAGNOSIS — Z4800 Encounter for change or removal of nonsurgical wound dressing: Secondary | ICD-10-CM | POA: Diagnosis not present

## 2016-06-17 DIAGNOSIS — H43813 Vitreous degeneration, bilateral: Secondary | ICD-10-CM | POA: Diagnosis not present

## 2016-06-17 DIAGNOSIS — H35033 Hypertensive retinopathy, bilateral: Secondary | ICD-10-CM | POA: Diagnosis not present

## 2016-06-17 DIAGNOSIS — S71112A Laceration without foreign body, left thigh, initial encounter: Secondary | ICD-10-CM | POA: Diagnosis not present

## 2016-06-17 DIAGNOSIS — H35433 Paving stone degeneration of retina, bilateral: Secondary | ICD-10-CM | POA: Diagnosis not present

## 2016-06-17 DIAGNOSIS — Z4800 Encounter for change or removal of nonsurgical wound dressing: Secondary | ICD-10-CM | POA: Diagnosis not present

## 2016-06-17 DIAGNOSIS — H35361 Drusen (degenerative) of macula, right eye: Secondary | ICD-10-CM | POA: Diagnosis not present

## 2016-06-19 DIAGNOSIS — L928 Other granulomatous disorders of the skin and subcutaneous tissue: Secondary | ICD-10-CM | POA: Diagnosis not present

## 2016-06-19 DIAGNOSIS — I10 Essential (primary) hypertension: Secondary | ICD-10-CM | POA: Diagnosis not present

## 2016-06-19 DIAGNOSIS — I2510 Atherosclerotic heart disease of native coronary artery without angina pectoris: Secondary | ICD-10-CM | POA: Diagnosis not present

## 2016-06-19 DIAGNOSIS — L97122 Non-pressure chronic ulcer of left thigh with fat layer exposed: Secondary | ICD-10-CM | POA: Diagnosis not present

## 2016-06-19 DIAGNOSIS — Z87891 Personal history of nicotine dependence: Secondary | ICD-10-CM | POA: Diagnosis not present

## 2016-06-19 DIAGNOSIS — S71112A Laceration without foreign body, left thigh, initial encounter: Secondary | ICD-10-CM | POA: Diagnosis not present

## 2016-06-21 DIAGNOSIS — Z4800 Encounter for change or removal of nonsurgical wound dressing: Secondary | ICD-10-CM | POA: Diagnosis not present

## 2016-06-21 DIAGNOSIS — S71112A Laceration without foreign body, left thigh, initial encounter: Secondary | ICD-10-CM | POA: Diagnosis not present

## 2016-06-24 DIAGNOSIS — Z4800 Encounter for change or removal of nonsurgical wound dressing: Secondary | ICD-10-CM | POA: Diagnosis not present

## 2016-06-24 DIAGNOSIS — S71112A Laceration without foreign body, left thigh, initial encounter: Secondary | ICD-10-CM | POA: Diagnosis not present

## 2016-06-27 DIAGNOSIS — Z4800 Encounter for change or removal of nonsurgical wound dressing: Secondary | ICD-10-CM | POA: Diagnosis not present

## 2016-06-27 DIAGNOSIS — S71112A Laceration without foreign body, left thigh, initial encounter: Secondary | ICD-10-CM | POA: Diagnosis not present

## 2016-07-01 DIAGNOSIS — S71112A Laceration without foreign body, left thigh, initial encounter: Secondary | ICD-10-CM | POA: Diagnosis not present

## 2016-07-01 DIAGNOSIS — Z4800 Encounter for change or removal of nonsurgical wound dressing: Secondary | ICD-10-CM | POA: Diagnosis not present

## 2016-07-02 DIAGNOSIS — S71112A Laceration without foreign body, left thigh, initial encounter: Secondary | ICD-10-CM | POA: Diagnosis not present

## 2016-07-02 DIAGNOSIS — Z4800 Encounter for change or removal of nonsurgical wound dressing: Secondary | ICD-10-CM | POA: Diagnosis not present

## 2016-07-03 ENCOUNTER — Encounter (HOSPITAL_BASED_OUTPATIENT_CLINIC_OR_DEPARTMENT_OTHER): Payer: Medicare Other | Attending: Surgery

## 2016-07-03 DIAGNOSIS — I2510 Atherosclerotic heart disease of native coronary artery without angina pectoris: Secondary | ICD-10-CM | POA: Diagnosis not present

## 2016-07-03 DIAGNOSIS — Z872 Personal history of diseases of the skin and subcutaneous tissue: Secondary | ICD-10-CM | POA: Insufficient documentation

## 2016-07-03 DIAGNOSIS — S71102D Unspecified open wound, left thigh, subsequent encounter: Secondary | ICD-10-CM | POA: Diagnosis not present

## 2016-07-03 DIAGNOSIS — Z09 Encounter for follow-up examination after completed treatment for conditions other than malignant neoplasm: Secondary | ICD-10-CM | POA: Insufficient documentation

## 2016-07-03 DIAGNOSIS — I10 Essential (primary) hypertension: Secondary | ICD-10-CM | POA: Insufficient documentation

## 2016-07-04 DIAGNOSIS — Z4800 Encounter for change or removal of nonsurgical wound dressing: Secondary | ICD-10-CM | POA: Diagnosis not present

## 2016-07-04 DIAGNOSIS — S71112A Laceration without foreign body, left thigh, initial encounter: Secondary | ICD-10-CM | POA: Diagnosis not present

## 2016-07-08 DIAGNOSIS — Z4800 Encounter for change or removal of nonsurgical wound dressing: Secondary | ICD-10-CM | POA: Diagnosis not present

## 2016-07-08 DIAGNOSIS — S71112A Laceration without foreign body, left thigh, initial encounter: Secondary | ICD-10-CM | POA: Diagnosis not present

## 2016-07-11 DIAGNOSIS — Z4800 Encounter for change or removal of nonsurgical wound dressing: Secondary | ICD-10-CM | POA: Diagnosis not present

## 2016-07-11 DIAGNOSIS — S71112A Laceration without foreign body, left thigh, initial encounter: Secondary | ICD-10-CM | POA: Diagnosis not present

## 2016-07-23 ENCOUNTER — Other Ambulatory Visit: Payer: Self-pay | Admitting: Interventional Cardiology

## 2016-07-24 DIAGNOSIS — M7542 Impingement syndrome of left shoulder: Secondary | ICD-10-CM | POA: Diagnosis not present

## 2016-07-24 DIAGNOSIS — M7541 Impingement syndrome of right shoulder: Secondary | ICD-10-CM | POA: Diagnosis not present

## 2016-07-26 ENCOUNTER — Encounter: Payer: Self-pay | Admitting: *Deleted

## 2016-07-26 DIAGNOSIS — R7689 Other specified abnormal immunological findings in serum: Secondary | ICD-10-CM | POA: Insufficient documentation

## 2016-07-26 DIAGNOSIS — M1990 Unspecified osteoarthritis, unspecified site: Secondary | ICD-10-CM | POA: Insufficient documentation

## 2016-07-26 DIAGNOSIS — R7982 Elevated C-reactive protein (CRP): Secondary | ICD-10-CM | POA: Insufficient documentation

## 2016-07-26 DIAGNOSIS — D72829 Elevated white blood cell count, unspecified: Secondary | ICD-10-CM | POA: Insufficient documentation

## 2016-07-26 DIAGNOSIS — Z8042 Family history of malignant neoplasm of prostate: Secondary | ICD-10-CM | POA: Insufficient documentation

## 2016-07-26 DIAGNOSIS — M109 Gout, unspecified: Secondary | ICD-10-CM | POA: Insufficient documentation

## 2016-07-26 DIAGNOSIS — E291 Testicular hypofunction: Secondary | ICD-10-CM | POA: Insufficient documentation

## 2016-07-26 DIAGNOSIS — Z6839 Body mass index (BMI) 39.0-39.9, adult: Secondary | ICD-10-CM | POA: Insufficient documentation

## 2016-07-26 DIAGNOSIS — R1310 Dysphagia, unspecified: Secondary | ICD-10-CM | POA: Insufficient documentation

## 2016-07-26 DIAGNOSIS — K0510 Chronic gingivitis, plaque induced: Secondary | ICD-10-CM | POA: Insufficient documentation

## 2016-07-26 DIAGNOSIS — R768 Other specified abnormal immunological findings in serum: Secondary | ICD-10-CM | POA: Insufficient documentation

## 2016-07-26 DIAGNOSIS — R7303 Prediabetes: Secondary | ICD-10-CM | POA: Insufficient documentation

## 2016-07-26 DIAGNOSIS — F321 Major depressive disorder, single episode, moderate: Secondary | ICD-10-CM | POA: Insufficient documentation

## 2016-07-26 DIAGNOSIS — I10 Essential (primary) hypertension: Secondary | ICD-10-CM | POA: Insufficient documentation

## 2016-07-26 DIAGNOSIS — A09 Infectious gastroenteritis and colitis, unspecified: Secondary | ICD-10-CM | POA: Insufficient documentation

## 2016-07-29 ENCOUNTER — Ambulatory Visit (INDEPENDENT_AMBULATORY_CARE_PROVIDER_SITE_OTHER): Payer: Medicare Other | Admitting: Interventional Cardiology

## 2016-07-29 ENCOUNTER — Encounter: Payer: Self-pay | Admitting: Interventional Cardiology

## 2016-07-29 ENCOUNTER — Encounter (INDEPENDENT_AMBULATORY_CARE_PROVIDER_SITE_OTHER): Payer: Self-pay

## 2016-07-29 VITALS — BP 106/70 | HR 72 | Ht 70.0 in | Wt 246.1 lb

## 2016-07-29 DIAGNOSIS — E782 Mixed hyperlipidemia: Secondary | ICD-10-CM | POA: Diagnosis not present

## 2016-07-29 DIAGNOSIS — I209 Angina pectoris, unspecified: Secondary | ICD-10-CM

## 2016-07-29 DIAGNOSIS — I25118 Atherosclerotic heart disease of native coronary artery with other forms of angina pectoris: Secondary | ICD-10-CM

## 2016-07-29 DIAGNOSIS — I10 Essential (primary) hypertension: Secondary | ICD-10-CM | POA: Diagnosis not present
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
## 2016-07-29 NOTE — Patient Instructions (Signed)
Medication Instructions:  None  Labwork: None  Testing/Procedures: None  Follow-Up: Your physician wants you to follow-up in: 6 months with Dr. Irish Lack.  You will receive a reminder letter in the mail two months in advance. If you don't receive a letter, please call our office to schedule the follow-up appointment.   Any Other Special Instructions Will Be Listed Below (If Applicable).     If you need a refill on your cardiac medications before your next appointment, please call your pharmacy.

## 2016-08-06 DIAGNOSIS — R399 Unspecified symptoms and signs involving the genitourinary system: Secondary | ICD-10-CM | POA: Diagnosis not present

## 2016-08-06 DIAGNOSIS — F5101 Primary insomnia: Secondary | ICD-10-CM | POA: Diagnosis not present

## 2016-08-16 ENCOUNTER — Other Ambulatory Visit: Payer: Self-pay | Admitting: Interventional Cardiology

## 2016-08-21 DIAGNOSIS — M7542 Impingement syndrome of left shoulder: Secondary | ICD-10-CM | POA: Diagnosis not present

## 2016-08-21 DIAGNOSIS — M7541 Impingement syndrome of right shoulder: Secondary | ICD-10-CM | POA: Diagnosis not present

## 2016-08-26 ENCOUNTER — Other Ambulatory Visit: Payer: Self-pay

## 2016-08-26 DIAGNOSIS — R072 Precordial pain: Secondary | ICD-10-CM

## 2016-08-26 MED ORDER — ISOSORBIDE MONONITRATE ER 60 MG PO TB24: 90.0000 mg | ORAL_TABLET | Freq: Every day | ORAL | 3 refills | Status: DC

## 2016-08-26 MED ORDER — METOPROLOL TARTRATE 25 MG PO TABS: 25.0000 mg | ORAL_TABLET | Freq: Two times a day (BID) | ORAL | 2 refills | Status: DC

## 2016-08-27 ENCOUNTER — Other Ambulatory Visit: Payer: Self-pay | Admitting: *Deleted

## 2016-08-27 MED ORDER — RAMIPRIL 10 MG PO CAPS: 10.0000 mg | ORAL_CAPSULE | Freq: Every day | ORAL | 3 refills | Status: DC

## 2016-09-10 ENCOUNTER — Telehealth: Payer: Self-pay | Admitting: Interventional Cardiology

## 2016-09-10 MED ORDER — RAMIPRIL 5 MG PO CAPS: 5.0000 mg | ORAL_CAPSULE | Freq: Every day | ORAL | 3 refills | Status: DC

## 2016-09-10 NOTE — Telephone Encounter (Signed)
Patient's wife calling and states that her husband's BP is low this morning (95/55, 84/55, 87/59 with HRs in the 70s). She states that the patient is dizzy with position changes and denies any other symptoms. Patient takes metoprolol 25 mg BID, ramipril 10 mg QD, imdur 90 mg QD, and ranexa 500 mg BID. Advised for patient to hold PM meds, drink fluids, elevate his legs, and change positions slowly. Advised to continue to monitor BP and information will be sent to Dr. Irish Lack for review.

## 2016-09-10 NOTE — Telephone Encounter (Signed)
Called and spoke to patient's wife and made her aware that Dr. Irish Lack would like for the patient to decrease his ramipril to 5 mg daily. New Rx sent to patient's preferred pharmacy. Advised for patient to continue to monitor BP. Wife verbalized understanding and thanked me for the call.

## 2016-09-10 NOTE — Telephone Encounter (Signed)
Decrease ramipril to 5 mg daily given low BP readings.

## 2016-09-10 NOTE — Telephone Encounter (Signed)
New message    Pt c/o BP issue: STAT if pt c/o blurred vision, one-sided weakness or slurred speech  1. What are your last 5 BP readings? 95/55 84/55 87/59    2. Are you having any other symptoms (ex. Dizziness, headache, blurred vision, passed out)? Dizziness   3. What is your BP issue? Pt bp is running low per wife. She states that pt is not feeling well.    Pt wife wants to know if he should take his evening bp meds.

## 2016-09-10 NOTE — Telephone Encounter (Signed)
Patient reporting most recent BP at 96/62 HR 69. Patient made aware that I would give him a call with Dr. Hassell Done recommendations.

## 2016-09-10 NOTE — Telephone Encounter (Signed)
New Message ° ° pt verbalized that she is returning call for rn °

## 2016-10-14 DIAGNOSIS — R3915 Urgency of urination: Secondary | ICD-10-CM | POA: Diagnosis not present

## 2016-10-14 DIAGNOSIS — R350 Frequency of micturition: Secondary | ICD-10-CM | POA: Diagnosis not present

## 2016-11-09 DIAGNOSIS — Z23 Encounter for immunization: Secondary | ICD-10-CM | POA: Diagnosis not present

## 2016-11-12 ENCOUNTER — Ambulatory Visit
Admission: RE | Admit: 2016-11-12 | Discharge: 2016-11-12 | Disposition: A | Discharge status: Home / Self Care | Payer: Medicare Other | Source: Ambulatory Visit | Attending: Family Medicine | Admitting: Family Medicine

## 2016-11-12 ENCOUNTER — Other Ambulatory Visit: Payer: Self-pay | Admitting: Family Medicine

## 2016-11-12 DIAGNOSIS — S2242XA Multiple fractures of ribs, left side, initial encounter for closed fracture: Secondary | ICD-10-CM | POA: Diagnosis not present

## 2016-11-12 DIAGNOSIS — S299XXA Unspecified injury of thorax, initial encounter: Secondary | ICD-10-CM

## 2016-11-14 ENCOUNTER — Other Ambulatory Visit: Payer: Self-pay | Admitting: *Deleted

## 2016-11-14 DIAGNOSIS — R072 Precordial pain: Secondary | ICD-10-CM

## 2016-11-14 MED ORDER — ISOSORBIDE MONONITRATE ER 60 MG PO TB24: 90.0000 mg | ORAL_TABLET | Freq: Every day | ORAL | 2 refills | Status: DC

## 2016-11-14 MED ORDER — RAMIPRIL 5 MG PO CAPS: 5.0000 mg | ORAL_CAPSULE | Freq: Every day | ORAL | 2 refills | Status: DC

## 2016-11-14 MED ORDER — ATORVASTATIN CALCIUM 10 MG PO TABS: 10.0000 mg | ORAL_TABLET | Freq: Every day | ORAL | 2 refills | Status: DC

## 2016-11-18 DIAGNOSIS — D500 Iron deficiency anemia secondary to blood loss (chronic): Secondary | ICD-10-CM | POA: Diagnosis not present

## 2016-11-18 DIAGNOSIS — K210 Gastro-esophageal reflux disease with esophagitis: Secondary | ICD-10-CM | POA: Diagnosis not present

## 2017-01-20 DIAGNOSIS — N5201 Erectile dysfunction due to arterial insufficiency: Secondary | ICD-10-CM | POA: Diagnosis not present

## 2017-01-20 DIAGNOSIS — R3915 Urgency of urination: Secondary | ICD-10-CM | POA: Diagnosis not present

## 2017-01-27 ENCOUNTER — Ambulatory Visit (INDEPENDENT_AMBULATORY_CARE_PROVIDER_SITE_OTHER): Payer: Medicare Other | Admitting: Interventional Cardiology

## 2017-01-27 ENCOUNTER — Encounter: Payer: Self-pay | Admitting: Interventional Cardiology

## 2017-01-27 VITALS — BP 124/78 | HR 72 | Ht 70.0 in | Wt 243.6 lb

## 2017-01-27 DIAGNOSIS — Z6834 Body mass index (BMI) 34.0-34.9, adult: Secondary | ICD-10-CM

## 2017-01-27 DIAGNOSIS — I209 Angina pectoris, unspecified: Secondary | ICD-10-CM

## 2017-01-27 DIAGNOSIS — E782 Mixed hyperlipidemia: Secondary | ICD-10-CM | POA: Diagnosis not present

## 2017-01-27 DIAGNOSIS — E669 Obesity, unspecified: Secondary | ICD-10-CM

## 2017-01-27 DIAGNOSIS — I25118 Atherosclerotic heart disease of native coronary artery with other forms of angina pectoris: Secondary | ICD-10-CM

## 2017-01-27 NOTE — Progress Notes (Signed)
Cardiology Office Note   Date:  01/27/2017   ID:  Erik [name], DOB [birth year], MRN [id number]  PCP:  Maurice Small, MD    No chief complaint on file. CAD   Wt Readings from Last 3 Encounters:  01/27/17 243 lb 9.6 oz (110.5 kg)  07/29/16 246 lb 1.9 oz (111.6 kg)  01/17/16 264 lb 3.2 oz (119.8 kg)       History of Present Illness: Erik [name] is a 71 y.o. male  who had progressive anginal symptoms. He underwent cardiac catheterization in April 2017 which revealed a focal lesion in the circumflex. There was a chronic total occlusion of the RCA with left to right collaterals. Due to the lack of LAD disease noted, he was not referred for bypass and the plan was for medical therapy. He returns today for follow-up.  Of note, there is difficulty completing the heart cath from his right radial approach. He had no problems with the right radial site post cath.  It was noted at the time of cath that his RCA is not a great candidate for percutaneous intervention of the CTO because there are multiple branch vessels which potentially would be lost, along this long chronic total occlusion.  Isosorbide was increased in April 2017. After this, he was better in terms of chest pain.    I recommended against testosterone supplement in the past.    He has lost 40 lbs between June 2017 until 10/18.  He continues to walk regulraly.  His stamina is not what it used to be, but he gets his tasks done.    Denies : Dizziness. Leg edema. Orthopnea. Palpitations. Paroxysmal nocturnal dyspnea. Shortness of breath. Syncope.   He has had some chest pressure when he lies down at night.  None with exertion.  He thinks this is related to gas.    Past Medical History:  Diagnosis Date  . History of echocardiogram    Echo 4/17 - mild concentric LVH, EF 55-60%, no RWMA, Gr 1 DD, mild AI, mild MR, mild LAE  . Hypertension   . Kidney stone   . Obesity   . Tinea corporis     Past Surgical History:    Procedure Laterality Date  . TOTAL KNEE ARTHROPLASTY Bilateral      Current Outpatient Medications  Medication Sig Dispense Refill  . aspirin 81 MG tablet Take 81 mg by mouth daily.    Marland Kitchen atorvastatin (LIPITOR) 10 MG tablet Take 1 tablet (10 mg total) by mouth daily. 90 tablet 2  . B Complex Vitamins (VITAMIN B COMPLEX) TABS Take 1 tablet by mouth daily.     . B Complex-C (SUPER B COMPLEX PO) Take 1 tablet by mouth daily.    . Calcium Citrate-Vitamin D (CALCIUM + D PO) Take 1 capsule by mouth as directed. Takes 600 mg as directed    . calcium-vitamin D (OSCAL WITH D) 500-200 MG-UNIT per tablet Take 1 tablet by mouth daily.    . Cholecalciferol (VITAMIN D3) 5000 units CAPS Take 1 capsule by mouth as directed.    . clonazePAM (KLONOPIN) 1 MG tablet Take 1 mg by mouth at bedtime as needed (for sleep).     . CORAL CALCIUM PO Take 1 capsule by mouth daily.    . fexofenadine (ALLEGRA ALLERGY) 180 MG tablet Take 180 mg by mouth as directed.    Marland Kitchen FISH OIL-KRILL OIL PO Take 1 tablet by mouth as directed. 1250 mg by mouth daily    .  Flaxseed, Linseed, (FLAXSEED OIL) 1000 MG CAPS Take 1 capsule by mouth as directed.    . furosemide (LASIX) 40 MG tablet Take 40 mg by mouth as needed for fluid or edema.    Marland Kitchen HYDROcodone-acetaminophen (NORCO/VICODIN) 5-325 MG tablet Take 1 tablet by mouth every 6 (six) hours as needed for moderate pain.    . isosorbide mononitrate (IMDUR) 60 MG 24 hr tablet Take 1.5 tablets (90 mg total) by mouth daily. 135 tablet 2  . meloxicam (MOBIC) 15 MG tablet Take 15 mg by mouth daily.  0  . metoprolol tartrate (LOPRESSOR) 25 MG tablet Take 1 tablet (25 mg total) by mouth 2 (two) times daily. 180 tablet 2  . Multiple Vitamin (MULITIVITAMIN WITH MINERALS) TABS Take 1 tablet by mouth daily. MEN'S PRIME    . nitroGLYCERIN (NITROSTAT) 0.4 MG SL tablet DISSOLVE 1 TABLET UNDER THETONGUE EVERY 5 MINUTES AS  NEEDED FOR CHEST PAIN 25 tablet 4  . omeprazole (PRILOSEC) 40 MG capsule Take 40  mg by mouth daily.    . ramipril (ALTACE) 5 MG capsule Take 1 capsule (5 mg total) by mouth daily. 90 capsule 2  . ranolazine (RANEXA) 500 MG 12 hr tablet Take 1 tablet (500 mg total) by mouth 2 (two) times daily. 180 tablet 3  . Saw Palmetto 450 MG CAPS Take 1 capsule by mouth daily.    . Selenium 200 MCG CAPS Take 1 capsule by mouth as directed.    Marland Kitchen Ubiquinol 100 MG CAPS Take 1 tablet by mouth as directed.    . vitamin E 400 UNIT capsule Take 400 Units by mouth daily.     No current facility-administered medications for this visit.     Allergies:   Patient has no known allergies.    Social History:  The patient  reports that he has quit smoking. he has never used smokeless tobacco.   Family History:  The patient's family history includes Heart disease in his mother; Prostate cancer in his father.    ROS:  Please see the history of present illness.   Otherwise, review of systems are positive for some mild DOE, intentional weight loss.   All other systems are reviewed and negative.    PHYSICAL EXAM: VS:  BP 124/78   Pulse 72   Ht 5\' 10"  (1.778 m)   Wt 243 lb 9.6 oz (110.5 kg)   SpO2 94%   BMI 34.95 kg/m  , BMI Body mass index is 34.95 kg/m. GEN: Well nourished, well developed, in no acute distress  HEENT: normal  Neck: no JVD, carotid bruits, or masses Cardiac: RRR; no murmurs, rubs, or gallops,no edema  Respiratory:  clear to auscultation bilaterally, normal work of breathing GI: soft, nontender, nondistended, + BS, obese MS: no deformity or atrophy  Skin: warm and dry, no rash Neuro:  Strength and sensation are intact Psych: euthymic mood, full affect    Recent Labs: No results found for requested labs within last 8760 hours.   Lipid Panel    Component Value Date/Time   CHOL 198 07/05/2009 0957   TRIG 98.0 07/05/2009 0957   HDL 64.50 07/05/2009 0957   CHOLHDL 3 07/05/2009 0957   VLDL 19.6 07/05/2009 0957   LDLCALC 114 (H) 07/05/2009 0957     Other studies  Reviewed: Additional studies/ records that were reviewed today with results demonstrating: lipids and cath results   ASSESSMENT AND PLAN:  1. CAD: Medically managed this far.   In the past, we discussed revascularization,  but decided against it.   2. Obesity: He continues to lose weight.  The pace of weight loss has slowed. Continue lifestyle changes. 3. Hyperlipidemia: LDL 70 in Jan 2019.  Continue lipid lowering therapy.     Current medicines are reviewed at length with the patient today.  The patient concerns regarding his medicines were addressed.  The following changes have been made:  No change  Labs/ tests ordered today include:  No orders of the defined types were placed in this encounter.   Recommend 150 minutes/week of aerobic exercise Low fat, low carb, high fiber diet recommended  Disposition:   FU in 6 months   Signed, Larae Grooms, MD  01/27/2017 2:31 PM    [facility] [place], [place],   [address] Phone: [phone]; Fax: [phone]

## 2017-01-27 NOTE — Patient Instructions (Addendum)

## 2017-01-30 DIAGNOSIS — D692 Other nonthrombocytopenic purpura: Secondary | ICD-10-CM | POA: Diagnosis not present

## 2017-01-30 DIAGNOSIS — L218 Other seborrheic dermatitis: Secondary | ICD-10-CM | POA: Diagnosis not present

## 2017-01-30 DIAGNOSIS — D1801 Hemangioma of skin and subcutaneous tissue: Secondary | ICD-10-CM | POA: Diagnosis not present

## 2017-01-30 DIAGNOSIS — L821 Other seborrheic keratosis: Secondary | ICD-10-CM | POA: Diagnosis not present

## 2017-01-30 DIAGNOSIS — L814 Other melanin hyperpigmentation: Secondary | ICD-10-CM | POA: Diagnosis not present

## 2017-01-30 DIAGNOSIS — L905 Scar conditions and fibrosis of skin: Secondary | ICD-10-CM | POA: Diagnosis not present

## 2017-01-30 DIAGNOSIS — L570 Actinic keratosis: Secondary | ICD-10-CM | POA: Diagnosis not present

## 2017-02-04 DIAGNOSIS — Z23 Encounter for immunization: Secondary | ICD-10-CM | POA: Diagnosis not present

## 2017-03-06 DIAGNOSIS — Z23 Encounter for immunization: Secondary | ICD-10-CM | POA: Diagnosis not present

## 2017-04-09 DIAGNOSIS — H2513 Age-related nuclear cataract, bilateral: Secondary | ICD-10-CM | POA: Diagnosis not present

## 2017-04-15 DIAGNOSIS — J40 Bronchitis, not specified as acute or chronic: Secondary | ICD-10-CM | POA: Diagnosis not present

## 2017-04-15 DIAGNOSIS — R7303 Prediabetes: Secondary | ICD-10-CM | POA: Diagnosis not present

## 2017-04-15 DIAGNOSIS — Z13220 Encounter for screening for lipoid disorders: Secondary | ICD-10-CM | POA: Diagnosis not present

## 2017-04-15 DIAGNOSIS — Z0000 Encounter for general adult medical examination without abnormal findings: Secondary | ICD-10-CM | POA: Diagnosis not present

## 2017-04-15 DIAGNOSIS — Z125 Encounter for screening for malignant neoplasm of prostate: Secondary | ICD-10-CM | POA: Diagnosis not present

## 2017-04-15 DIAGNOSIS — Z136 Encounter for screening for cardiovascular disorders: Secondary | ICD-10-CM | POA: Diagnosis not present

## 2017-04-15 DIAGNOSIS — I10 Essential (primary) hypertension: Secondary | ICD-10-CM | POA: Diagnosis not present

## 2017-05-26 ENCOUNTER — Other Ambulatory Visit: Payer: Self-pay | Admitting: Interventional Cardiology

## 2017-05-26 DIAGNOSIS — R072 Precordial pain: Secondary | ICD-10-CM

## 2017-05-26 MED ORDER — ATORVASTATIN CALCIUM 10 MG PO TABS: 10.0000 mg | ORAL_TABLET | Freq: Every day | ORAL | 2 refills | Status: DC

## 2017-05-26 MED ORDER — RANOLAZINE ER 500 MG PO TB12: 500.0000 mg | ORAL_TABLET | Freq: Two times a day (BID) | ORAL | 2 refills | Status: DC

## 2017-05-26 MED ORDER — RAMIPRIL 5 MG PO CAPS: 5.0000 mg | ORAL_CAPSULE | Freq: Every day | ORAL | 2 refills | Status: DC

## 2017-05-26 MED ORDER — ISOSORBIDE MONONITRATE ER 60 MG PO TB24: 90.0000 mg | ORAL_TABLET | Freq: Every day | ORAL | 2 refills | Status: DC

## 2017-05-26 MED ORDER — METOPROLOL TARTRATE 25 MG PO TABS: 25.0000 mg | ORAL_TABLET | Freq: Two times a day (BID) | ORAL | 2 refills | Status: DC

## 2017-05-26 NOTE — Telephone Encounter (Signed)
Pt's medications were sent to pt's pharmacy as requested. Confirmation received.  

## 2017-05-26 NOTE — Telephone Encounter (Signed)
°*  STAT* If patient is at the pharmacy, call can be transferred to refill team.   1. Which medications need to be refilled? (please list name of each medication and dose if known) Atorvastatin 10 mg, Isosorbide mononitrate 60 mg,  Renaxa 500 mg, Ramipril 5 mg and Metoprolol Tartrate 25 mg  2. Which pharmacy/location (including street and city if local pharmacy) is medication to be sent to? [address] [place], [place] [address]  3. Do they need a 30 day or 90 day supply? 90 day

## 2017-06-24 DIAGNOSIS — H25043 Posterior subcapsular polar age-related cataract, bilateral: Secondary | ICD-10-CM | POA: Diagnosis not present

## 2017-06-24 DIAGNOSIS — H40003 Preglaucoma, unspecified, bilateral: Secondary | ICD-10-CM | POA: Diagnosis not present

## 2017-06-24 DIAGNOSIS — H18413 Arcus senilis, bilateral: Secondary | ICD-10-CM | POA: Diagnosis not present

## 2017-06-24 DIAGNOSIS — H2512 Age-related nuclear cataract, left eye: Secondary | ICD-10-CM | POA: Diagnosis not present

## 2017-06-24 DIAGNOSIS — H25013 Cortical age-related cataract, bilateral: Secondary | ICD-10-CM | POA: Diagnosis not present

## 2017-06-24 DIAGNOSIS — H2513 Age-related nuclear cataract, bilateral: Secondary | ICD-10-CM | POA: Diagnosis not present

## 2017-06-25 ENCOUNTER — Telehealth: Payer: Self-pay | Admitting: *Deleted

## 2017-06-25 NOTE — Telephone Encounter (Signed)
    [facility] Pre-operative Risk Assessment    Request for surgical clearance:  What type of surgery is being performed?Cataract extraction w/intraocular Lens implant of left eye follow by right eye. 1. When is this surgery scheduled? 08/11/17 and 09/01/17.  2. What type of clearance is required (medical clearance vs. Pharmacy clearan?*ce to hold med vs. Both)? Medical clearance.  3. Are there any medications that need to be held prior to surgery and how long pt does not need to stop any medication. Pt does NOT need to hold any medications including Plavix,Xarelto etc.  4. Practice name and name of physician performing surgery? Dr. Darleen Crocker MD   5. What is your office phone and fax number? Office [phone], Fax # [phone]  6. Anesthesia type (None, local, MAC, general) ? Topical anesthesia and IV medication.   Erik [name] 06/25/2017, 11:02 AM  _________________________________________________________________   (provider comments below)

## 2017-06-26 NOTE — Telephone Encounter (Signed)
   Primary Cardiologist: No primary care provider on file.  Chart reviewed as part of pre-operative protocol coverage. Given past medical history and time since last visit, based on ACC/AHA guidelines, Erik [name] would be at acceptable risk for the planned procedure without further cardiovascular testing.   I will route this recommendation to the requesting party via Epic fax function and remove from pre-op pool.  Please call with questions.  Kerin Ransom, PA-C 06/26/2017, 8:40 AM

## 2017-07-07 DIAGNOSIS — R350 Frequency of micturition: Secondary | ICD-10-CM | POA: Diagnosis not present

## 2017-07-07 DIAGNOSIS — N401 Enlarged prostate with lower urinary tract symptoms: Secondary | ICD-10-CM | POA: Diagnosis not present

## 2017-08-13 DIAGNOSIS — Z23 Encounter for immunization: Secondary | ICD-10-CM | POA: Diagnosis not present

## 2017-08-18 DIAGNOSIS — H2512 Age-related nuclear cataract, left eye: Secondary | ICD-10-CM | POA: Diagnosis not present

## 2017-08-19 DIAGNOSIS — Z961 Presence of intraocular lens: Secondary | ICD-10-CM | POA: Diagnosis not present

## 2017-08-19 DIAGNOSIS — H2511 Age-related nuclear cataract, right eye: Secondary | ICD-10-CM | POA: Diagnosis not present

## 2017-09-02 ENCOUNTER — Encounter: Payer: Self-pay | Admitting: Interventional Cardiology

## 2017-09-02 ENCOUNTER — Ambulatory Visit (INDEPENDENT_AMBULATORY_CARE_PROVIDER_SITE_OTHER): Payer: Medicare Other | Admitting: Interventional Cardiology

## 2017-09-02 VITALS — BP 100/72 | HR 71 | Ht 70.0 in | Wt 247.0 lb

## 2017-09-02 DIAGNOSIS — E782 Mixed hyperlipidemia: Secondary | ICD-10-CM | POA: Diagnosis not present

## 2017-09-02 DIAGNOSIS — I25118 Atherosclerotic heart disease of native coronary artery with other forms of angina pectoris: Secondary | ICD-10-CM | POA: Diagnosis not present

## 2017-09-02 DIAGNOSIS — I10 Essential (primary) hypertension: Secondary | ICD-10-CM

## 2017-09-02 MED ORDER — RANOLAZINE ER 500 MG PO TB12: 500.0000 mg | ORAL_TABLET | Freq: Every day | ORAL | 1 refills | Status: DC

## 2017-09-02 NOTE — Progress Notes (Signed)
Cardiology Office Note   Date:  09/02/2017   ID:  Erik [name], DOB November 13, 1945, MRN [id number]  PCP:  Maurice Small, MD    No chief complaint on file.  CAD  Wt Readings from Last 3 Encounters:  09/02/17 247 lb (112 kg)  01/27/17 243 lb 9.6 oz (110.5 kg)  07/29/16 246 lb 1.9 oz (111.6 kg)       History of Present Illness: Erik [name] is a 72 y.o. male  who had progressive anginal symptoms. He underwent cardiac catheterization in April 2017 which revealed a focal lesion in the circumflex. There was a chronic total occlusion of the RCA with left to right collaterals. Due to the lack of LAD disease noted, he was not referred for bypass and the plan was for medical therapy. He returns today for follow-up.  Of note, there is difficulty completing the heart cath from his right radial approach.   Cath in 2017, it was noted at the time of cath that his RCA is not a great candidate for percutaneous intervention of the CTO because there are multiple branch vessels which potentially would be lost, along this long chronic total occlusion.  Isosorbide was increased in April 2017. After this, he was better in terms of chest pain.    I recommended against testosterone supplement in the past.   He has lost 40 lbs between June 2017 until 10/18.  He continues to walk regulraly.  His stamina is not what it used to be, but he gets his tasks done.    Having cataract surgery on the right.  He had the left done.    Maintaining weight loss for the most part.    Denies : Chest pain. Dizziness. Leg edema. Orthopnea. Palpitations. Paroxysmal nocturnal dyspnea. Shortness of breath. Syncope.   He just came back from [place].  He did a fair bit of walking and did well.  He ate more than usual there and gained some weight.    He has rarely used SL NTG.   Past Medical History:  Diagnosis Date  . History of echocardiogram    Echo 4/17 - mild concentric LVH, EF 55-60%, no RWMA, Gr 1 DD, mild  AI, mild MR, mild LAE  . Hypertension   . Kidney stone   . Obesity   . Tinea corporis     Past Surgical History:  Procedure Laterality Date  . CARDIAC CATHETERIZATION N/A 07/13/2015   Procedure: Left Heart Cath and Coronary Angiography;  Surgeon: Jettie Booze, MD;  Location: [facility];  Service: Cardiovascular;  Laterality: N/A;  . TOTAL KNEE ARTHROPLASTY Bilateral      Current Outpatient Medications  Medication Sig Dispense Refill  . aspirin 81 MG tablet Take 81 mg by mouth daily.    Marland Kitchen atorvastatin (LIPITOR) 10 MG tablet Take 1 tablet (10 mg total) by mouth daily. 90 tablet 2  . B Complex Vitamins (VITAMIN B COMPLEX) TABS Take 1 tablet by mouth daily.     . B Complex-C (SUPER B COMPLEX PO) Take 1 tablet by mouth daily.    . Calcium Citrate-Vitamin D (CALCIUM + D PO) Take 1 capsule by mouth as directed. Takes 600 mg as directed    . calcium-vitamin D (OSCAL WITH D) 500-200 MG-UNIT per tablet Take 1 tablet by mouth daily.    . Cholecalciferol (VITAMIN D3) 5000 units CAPS Take 1 capsule by mouth as directed.    . clonazePAM (KLONOPIN) 1 MG tablet Take 1 mg by  mouth at bedtime as needed (for sleep).     . CORAL CALCIUM PO Take 1 capsule by mouth daily.    . fexofenadine (ALLEGRA ALLERGY) 180 MG tablet Take 180 mg by mouth as directed.    Marland Kitchen FISH OIL-KRILL OIL PO Take 1 tablet by mouth as directed. 1250 mg by mouth daily    . Flaxseed, Linseed, (FLAXSEED OIL) 1000 MG CAPS Take 1 capsule by mouth as directed.    . furosemide (LASIX) 40 MG tablet Take 40 mg by mouth as needed for fluid or edema.    . isosorbide mononitrate (IMDUR) 60 MG 24 hr tablet Take 1.5 tablets (90 mg total) by mouth daily. 135 tablet 2  . meloxicam (MOBIC) 15 MG tablet Take 15 mg by mouth daily.  0  . metoprolol tartrate (LOPRESSOR) 25 MG tablet Take 1 tablet (25 mg total) by mouth 2 (two) times daily. 180 tablet 2  . Multiple Vitamin (MULITIVITAMIN WITH MINERALS) TABS Take 1 tablet by mouth daily.  MEN'S PRIME    . nitroGLYCERIN (NITROSTAT) 0.4 MG SL tablet DISSOLVE 1 TABLET UNDER THETONGUE EVERY 5 MINUTES AS  NEEDED FOR CHEST PAIN 25 tablet 4  . omeprazole (PRILOSEC) 40 MG capsule Take 40 mg by mouth daily.    . ramipril (ALTACE) 5 MG capsule Take 1 capsule (5 mg total) by mouth daily. 90 capsule 2  . ranolazine (RANEXA) 500 MG 12 hr tablet Take 1 tablet (500 mg total) by mouth 2 (two) times daily. 180 tablet 2  . Saw Palmetto 450 MG CAPS Take 1 capsule by mouth daily.    . Selenium 200 MCG CAPS Take 1 capsule by mouth as directed.    Marland Kitchen Ubiquinol 100 MG CAPS Take 1 tablet by mouth as directed.    . vitamin E 400 UNIT capsule Take 400 Units by mouth daily.     No current facility-administered medications for this visit.     Allergies:   Patient has no known allergies.    Social History:  The patient  reports that he has quit smoking. He has never used smokeless tobacco.   Family History:  The patient's family history includes Heart disease in his mother; Prostate cancer in his father.    ROS:  Please see the history of present illness.   Otherwise, review of systems are positive for difficulty losing weight.   All other systems are reviewed and negative.    PHYSICAL EXAM: VS:  BP 100/72   Pulse 71   Ht 5\' 10"  (1.778 m)   Wt 247 lb (112 kg)   SpO2 97%   BMI 35.44 kg/m  , BMI Body mass index is 35.44 kg/m. GEN: Well nourished, well developed, in no acute distress  HEENT: normal  Neck: no JVD, carotid bruits, or masses Cardiac: RRR; no murmurs, rubs, or gallops,no edema  Respiratory:  clear to auscultation bilaterally, normal work of breathing GI: soft, nontender, nondistended, + BS MS: no deformity or atrophy  Skin: warm and dry, no rash Neuro:  Strength and sensation are intact Psych: euthymic mood, full affect   EKG:   The ekg ordered today demonstrates NSR, inferior Q waves, PRWP   Recent Labs: No results found for requested labs within last 8760 hours.    Lipid Panel    Component Value Date/Time   CHOL 198 07/05/2009 0957   TRIG 98.0 07/05/2009 0957   HDL 64.50 07/05/2009 0957   CHOLHDL 3 07/05/2009 0957   VLDL 19.6 07/05/2009 0957  [address] (H) 07/05/2009 0957     Other studies Reviewed: Additional studies/ records that were reviewed today with results demonstrating: .   ASSESSMENT AND PLAN:  1. CAD: Anginal symptoms well controlled on current medications.  He does report that the Ranexa is expensive.  Will see if we can wean him off of this medicine.  Decrease it to once a day.  Continue other antianginals. 2. Hyperlipidemia: LDL 69 in January 2019.  Continue current lipid-lowering therapy. 3. Obesity: He continues to work on trying to lose weight.  Continue healthy diet with regular exercise.  Swimming is his most strenuous exercise. 4. HTN: The current medical regimen is effective;  continue present plan and medications.    Current medicines are reviewed at length with the patient today.  The patient concerns regarding his medicines were addressed.  The following changes have been made:  No change  Labs/ tests ordered today include:  No orders of the defined types were placed in this encounter.   Recommend 150 minutes/week of aerobic exercise Low fat, low carb, high fiber diet recommended  Disposition:   FU in 6 months   Signed, Larae Grooms, MD  09/02/2017 2:02 PM    [facility] [place], [place], [place]  [address] Phone: [phone]; Fax: [phone]

## 2017-09-02 NOTE — Patient Instructions (Signed)
Medication Instructions:  Your physician has recommended you make the following change in your medication:   DECREASE: ranolazine (ranexa) to 500 mg ONCE A DAY  Labwork: None ordered  Testing/Procedures: None ordered  Follow-Up: Your physician wants you to follow-up in: 6 months with Dr. Irish Lack. You will receive a reminder letter in the mail two months in advance. If you don't receive a letter, please call our office to schedule the follow-up appointment.   Any Other Special Instructions Will Be Listed Below (If Applicable).     If you need a refill on your cardiac medications before your next appointment, please call your pharmacy.

## 2017-09-08 DIAGNOSIS — H2511 Age-related nuclear cataract, right eye: Secondary | ICD-10-CM | POA: Diagnosis not present

## 2017-10-23 DIAGNOSIS — N202 Calculus of kidney with calculus of ureter: Secondary | ICD-10-CM | POA: Diagnosis not present

## 2017-10-23 DIAGNOSIS — N132 Hydronephrosis with renal and ureteral calculous obstruction: Secondary | ICD-10-CM | POA: Diagnosis not present

## 2017-10-23 DIAGNOSIS — N201 Calculus of ureter: Secondary | ICD-10-CM | POA: Diagnosis not present

## 2017-10-23 DIAGNOSIS — N281 Cyst of kidney, acquired: Secondary | ICD-10-CM | POA: Diagnosis not present

## 2017-11-06 DIAGNOSIS — N202 Calculus of kidney with calculus of ureter: Secondary | ICD-10-CM | POA: Diagnosis not present

## 2017-11-21 DIAGNOSIS — Z23 Encounter for immunization: Secondary | ICD-10-CM | POA: Diagnosis not present

## 2017-11-28 DIAGNOSIS — D500 Iron deficiency anemia secondary to blood loss (chronic): Secondary | ICD-10-CM | POA: Diagnosis not present

## 2017-11-28 DIAGNOSIS — K210 Gastro-esophageal reflux disease with esophagitis: Secondary | ICD-10-CM | POA: Diagnosis not present

## 2017-12-10 ENCOUNTER — Telehealth: Payer: Self-pay | Admitting: Interventional Cardiology

## 2017-12-10 DIAGNOSIS — H9190 Unspecified hearing loss, unspecified ear: Secondary | ICD-10-CM

## 2017-12-10 NOTE — Telephone Encounter (Signed)
Returned call to patient. He is requesting a referral to ENT/Audiology. He states that he is losing his hearing. Will review with Dr. Irish Lack.

## 2017-12-10 NOTE — Telephone Encounter (Signed)
New Message:    Patient is requesting a call back, pt did not want to share additional information

## 2017-12-11 NOTE — Telephone Encounter (Signed)
Left message for patient to call back  

## 2017-12-12 ENCOUNTER — Telehealth: Payer: Self-pay | Admitting: Interventional Cardiology

## 2017-12-12 NOTE — Telephone Encounter (Addendum)
Called and made the patient aware that Dr. Irish Lack would like to refer him to Dr. Vicie Mutters. Referral placed. Patient verbalized understanding and thanked me for the call.

## 2017-12-12 NOTE — Telephone Encounter (Signed)
See telephone encounter 12/10/17.

## 2017-12-12 NOTE — Telephone Encounter (Signed)
Follow Up:      Patient is returning your call. 

## 2018-01-27 DIAGNOSIS — H6123 Impacted cerumen, bilateral: Secondary | ICD-10-CM | POA: Diagnosis not present

## 2018-01-27 DIAGNOSIS — H903 Sensorineural hearing loss, bilateral: Secondary | ICD-10-CM | POA: Diagnosis not present

## 2018-01-27 DIAGNOSIS — H9313 Tinnitus, bilateral: Secondary | ICD-10-CM | POA: Diagnosis not present

## 2018-01-30 DIAGNOSIS — L570 Actinic keratosis: Secondary | ICD-10-CM | POA: Diagnosis not present

## 2018-01-30 DIAGNOSIS — D225 Melanocytic nevi of trunk: Secondary | ICD-10-CM | POA: Diagnosis not present

## 2018-01-30 DIAGNOSIS — L918 Other hypertrophic disorders of the skin: Secondary | ICD-10-CM | POA: Diagnosis not present

## 2018-01-30 DIAGNOSIS — L821 Other seborrheic keratosis: Secondary | ICD-10-CM | POA: Diagnosis not present

## 2018-01-30 DIAGNOSIS — D692 Other nonthrombocytopenic purpura: Secondary | ICD-10-CM | POA: Diagnosis not present

## 2018-01-30 DIAGNOSIS — D1801 Hemangioma of skin and subcutaneous tissue: Secondary | ICD-10-CM | POA: Diagnosis not present

## 2018-02-18 ENCOUNTER — Other Ambulatory Visit: Payer: Self-pay | Admitting: Interventional Cardiology

## 2018-02-19 ENCOUNTER — Other Ambulatory Visit: Payer: Self-pay | Admitting: Interventional Cardiology

## 2018-02-19 DIAGNOSIS — R072 Precordial pain: Secondary | ICD-10-CM

## 2018-02-25 ENCOUNTER — Encounter: Payer: Self-pay | Admitting: Interventional Cardiology

## 2018-02-25 ENCOUNTER — Ambulatory Visit (INDEPENDENT_AMBULATORY_CARE_PROVIDER_SITE_OTHER): Payer: Medicare Other | Admitting: Interventional Cardiology

## 2018-02-25 VITALS — BP 120/64 | HR 76 | Ht 70.0 in | Wt 248.6 lb

## 2018-02-25 DIAGNOSIS — I25118 Atherosclerotic heart disease of native coronary artery with other forms of angina pectoris: Secondary | ICD-10-CM

## 2018-02-25 DIAGNOSIS — E782 Mixed hyperlipidemia: Secondary | ICD-10-CM

## 2018-02-25 DIAGNOSIS — I10 Essential (primary) hypertension: Secondary | ICD-10-CM

## 2018-02-25 NOTE — Progress Notes (Signed)
Cardiology Office Note   Date:  02/25/2018   ID:  Erik [name], DOB 12-Oct-1945, MRN [id number]  PCP:  Erik Small, MD    No chief complaint on file.  CAD  Wt Readings from Last 3 Encounters:  02/25/18 248 lb 9.6 oz (112.8 kg)  09/02/17 247 lb (112 kg)  01/27/17 243 lb 9.6 oz (110.5 kg)       History of Present Illness: Erik [name] is a 72 y.o. male  who had progressive anginal symptoms. He underwent cardiac catheterization in April 2017 which revealed a focal lesion in the circumflex. There was a chronic total occlusion of the RCA with left to right collaterals. Due to the lack of LAD disease noted, he was not referred for bypass and the plan was for medical therapy. He returns today for follow-up.  Of note, there is difficulty completing the heart cath from his right radial approach.   Cath in 2017, it was noted at the time of cath that his RCA is not a great candidate for percutaneous intervention of the CTO because there are multiple branch vessels which potentially would be lost, along this long chronic total occlusion.  Isosorbide was increased in April 2017. After this, he was better in terms of chest pain.    I recommended against testosterone supplement in the past.  He has lost 40 lbs between June 2017 until 10/18.   He has had cataract surgery.  Since the last visit:  His exercise has been limited by the cataract surgery.  His exercise of choice was swimming, but was not allowed to do this.    Walking up stairs, he has some DOE.  THis has gotten worse over time.   Denies :  Dizziness. Leg edema. Nitroglycerin use. Orthopnea. Palpitations. Paroxysmal nocturnal dyspnea. Shortness of breath. Syncope.   He has used SL NTG on occasion.  He is not sure that it is cardiac pain, but he uses it anyway.   Past Medical History:  Diagnosis Date  . History of echocardiogram    Echo 4/17 - mild concentric LVH, EF 55-60%, no RWMA, Gr 1 DD, mild AI, mild MR,  mild LAE  . Hypertension   . Kidney stone   . Obesity   . Tinea corporis     Past Surgical History:  Procedure Laterality Date  . CARDIAC CATHETERIZATION N/A 07/13/2015   Procedure: Left Heart Cath and Coronary Angiography;  Surgeon: Jettie Booze, MD;  Location: [facility];  Service: Cardiovascular;  Laterality: N/A;  . TOTAL KNEE ARTHROPLASTY Bilateral      Current Outpatient Medications  Medication Sig Dispense Refill  . aspirin 81 MG tablet Take 81 mg by mouth daily.    Marland Kitchen atorvastatin (LIPITOR) 10 MG tablet TAKE 1 TABLET BY MOUTH EVERY DAY 90 tablet 1  . B Complex Vitamins (VITAMIN B COMPLEX) TABS Take 1 tablet by mouth daily.     . B Complex-C (SUPER B COMPLEX PO) Take 1 tablet by mouth daily.    . Calcium Citrate-Vitamin D (CALCIUM + D PO) Take 1 capsule by mouth as directed. Takes 600 mg as directed    . calcium-vitamin D (OSCAL WITH D) 500-200 MG-UNIT per tablet Take 1 tablet by mouth daily.    . Cholecalciferol (VITAMIN D3) 5000 units CAPS Take 1 capsule by mouth as directed.    . clonazePAM (KLONOPIN) 1 MG tablet Take 1 mg by mouth at bedtime as needed (for sleep).     Marland Kitchen  CORAL CALCIUM PO Take 1 capsule by mouth daily.    . fexofenadine (ALLEGRA ALLERGY) 180 MG tablet Take 180 mg by mouth as directed.    Marland Kitchen FISH OIL-KRILL OIL PO Take 1 tablet by mouth as directed. 1250 mg by mouth daily    . Flaxseed, Linseed, (FLAXSEED OIL) 1000 MG CAPS Take 1 capsule by mouth as directed.    . furosemide (LASIX) 40 MG tablet Take 40 mg by mouth as needed for fluid or edema.    . isosorbide mononitrate (IMDUR) 60 MG 24 hr tablet TAKE 1 AND 1/2 TABLETS BY MOUTH ONCE DAILY 135 tablet 2  . meloxicam (MOBIC) 15 MG tablet Take 15 mg by mouth daily.  0  . metoprolol tartrate (LOPRESSOR) 25 MG tablet TAKE 1 TABLET BY MOUTH TWICE A DAY 180 tablet 1  . Multiple Vitamin (MULITIVITAMIN WITH MINERALS) TABS Take 1 tablet by mouth daily. MEN'S PRIME    . nitroGLYCERIN (NITROSTAT) 0.4 MG SL  tablet DISSOLVE 1 TABLET UNDER THETONGUE EVERY 5 MINUTES AS  NEEDED FOR CHEST PAIN 25 tablet 4  . omeprazole (PRILOSEC) 40 MG capsule Take 40 mg by mouth daily.    . ramipril (ALTACE) 5 MG capsule TAKE 1 CAPSULE BY MOUTH EVERY DAY 90 capsule 1  . ranolazine (RANEXA) 500 MG 12 hr tablet Take 1 tablet (500 mg total) by mouth daily. 90 tablet 1  . Saw Palmetto 450 MG CAPS Take 1 capsule by mouth daily.    . Selenium 200 MCG CAPS Take 1 capsule by mouth as directed.    Marland Kitchen Ubiquinol 100 MG CAPS Take 1 tablet by mouth as directed.    . vitamin E 400 UNIT capsule Take 400 Units by mouth daily.     No current facility-administered medications for this visit.     Allergies:   Patient has no known allergies.    Social History:  The patient  reports that he has quit smoking. He has never used smokeless tobacco.   Family History:  The patient's family history includes Heart disease in his mother; Prostate cancer in his father.    ROS:  Please see the history of present illness.   Otherwise, review of systems are positive for fatigue.   All other systems are reviewed and negative.    PHYSICAL EXAM: VS:  BP 120/64   Pulse 76   Ht 5\' 10"  (1.778 m)   Wt 248 lb 9.6 oz (112.8 kg)   SpO2 97%   BMI 35.67 kg/m  , BMI Body mass index is 35.67 kg/m. GEN: Well nourished, well developed, in no acute distress  HEENT: normal  Neck: no JVD, carotid bruits, or masses Cardiac: RRR; no murmurs, rubs, or gallops,no edema  Respiratory:  clear to auscultation bilaterally, normal work of breathing GI: soft, nontender, nondistended, + BS MS: no deformity or atrophy  Skin: warm and dry, no rash Neuro:  Strength and sensation are intact Psych: euthymic mood, full affect     Recent Labs: No results found for requested labs within last 8760 hours.   Lipid Panel    Component Value Date/Time   CHOL 198 07/05/2009 0957   TRIG 98.0 07/05/2009 0957   HDL 64.50 07/05/2009 0957   CHOLHDL 3 07/05/2009 0957    VLDL 19.6 07/05/2009 0957   LDLCALC 114 (H) 07/05/2009 0957     Other studies Reviewed: Additional studies/ records that were reviewed today with results demonstrating: Labs reviewed.   ASSESSMENT AND PLAN:  1. CAD: Angina  controlled on medication.  Continue current medicines. 2. Hyperlipidemia: Lipids to be rechecked in January 2020.  For now, continue current lipid-lowering therapy. 3. Obesity: We spoke at length about trying to increase exercise.  He has gotten out of the routine he had been keeping for a long time.  He will try to get back to swimming. 4. HTN: Controlled.  Continue current medications. 5. Hearing loss: Seen by Dr.Krauss.  No hearing aids at this time.   Current medicines are reviewed at length with the patient today.  The patient concerns regarding his medicines were addressed.  The following changes have been made:  No change  Labs/ tests ordered today include:  No orders of the defined types were placed in this encounter.   Recommend 150 minutes/week of aerobic exercise Low fat, low carb, high fiber diet recommended  Disposition:   FU in 6 months   Signed, Larae Grooms, MD  02/25/2018 12:08 PM    [facility] [place], [place], [place]  [address] Phone: [phone]; Fax: [phone]

## 2018-02-25 NOTE — Patient Instructions (Signed)

## 2018-03-06 ENCOUNTER — Other Ambulatory Visit: Payer: Self-pay | Admitting: Interventional Cardiology

## 2018-05-01 DIAGNOSIS — I10 Essential (primary) hypertension: Secondary | ICD-10-CM | POA: Diagnosis not present

## 2018-05-01 DIAGNOSIS — Z0000 Encounter for general adult medical examination without abnormal findings: Secondary | ICD-10-CM | POA: Diagnosis not present

## 2018-05-01 DIAGNOSIS — F321 Major depressive disorder, single episode, moderate: Secondary | ICD-10-CM | POA: Diagnosis not present

## 2018-05-01 DIAGNOSIS — I25118 Atherosclerotic heart disease of native coronary artery with other forms of angina pectoris: Secondary | ICD-10-CM | POA: Diagnosis not present

## 2018-05-01 DIAGNOSIS — Z125 Encounter for screening for malignant neoplasm of prostate: Secondary | ICD-10-CM | POA: Diagnosis not present

## 2018-05-01 DIAGNOSIS — R7303 Prediabetes: Secondary | ICD-10-CM | POA: Diagnosis not present

## 2018-05-01 DIAGNOSIS — Z6839 Body mass index (BMI) 39.0-39.9, adult: Secondary | ICD-10-CM | POA: Diagnosis not present

## 2018-08-18 ENCOUNTER — Other Ambulatory Visit: Payer: Self-pay | Admitting: Interventional Cardiology

## 2018-10-05 ENCOUNTER — Telehealth: Payer: Self-pay

## 2018-10-05 NOTE — Telephone Encounter (Signed)
Virtual Visit Pre-Appointment Phone Call  TELEPHONE CALL NOTE  Erik [name] has been deemed a candidate for a follow-up tele-health visit to limit community exposure during the Covid-19 pandemic. I spoke with the patient via phone to ensure availability of phone/video source, confirm preferred email & phone number, and discuss instructions and expectations.  I reminded Erik [name] to be prepared with any vital sign and/or heart rhythm information that could potentially be obtained via home monitoring, at the time of his visit. I reminded Erik [name] to expect a phone call prior to his visit.  Patient agrees to consent below.  Erik Gustin, RN 10/05/2018 4:15 PM   FULL LENGTH CONSENT FOR TELE-HEALTH VISIT   I hereby voluntarily request, consent and authorize [facility] and its employed or contracted physicians, physician assistants, nurse practitioners or other licensed health care professionals (the Practitioner), to provide me with telemedicine health care services (the "Services") as deemed necessary by the treating Practitioner. I acknowledge and consent to receive the Services by the Practitioner via telemedicine. I understand that the telemedicine visit will involve communicating with the Practitioner through live audiovisual communication technology and the disclosure of certain medical information by electronic transmission. I acknowledge that I have been given the opportunity to request an in-person assessment or other available alternative prior to the telemedicine visit and am voluntarily participating in the telemedicine visit.  I understand that I have the right to withhold or withdraw my consent to the use of telemedicine in the course of my care at any time, without affecting my right to future care or treatment, and that the Practitioner or I may terminate the telemedicine visit at any time. I understand that I have the right to inspect all information obtained  and/or recorded in the course of the telemedicine visit and may receive copies of available information for a reasonable fee.  I understand that some of the potential risks of receiving the Services via telemedicine include:  Marland Kitchen Delay or interruption in medical evaluation due to technological equipment failure or disruption; . Information transmitted may not be sufficient (e.g. poor resolution of images) to allow for appropriate medical decision making by the Practitioner; and/or  . In rare instances, security protocols could fail, causing a breach of personal health information.  Furthermore, I acknowledge that it is my responsibility to provide information about my medical history, conditions and care that is complete and accurate to the best of my ability. I acknowledge that Practitioner's advice, recommendations, and/or decision may be based on factors not within their control, such as incomplete or inaccurate data provided by me or distortions of diagnostic images or specimens that may result from electronic transmissions. I understand that the practice of medicine is not an exact science and that Practitioner makes no warranties or guarantees regarding treatment outcomes. I acknowledge that I will receive a copy of this consent concurrently upon execution via email to the email address I last provided but may also request a printed copy by calling the office of [facility].    I understand that my insurance will be billed for this visit.   I have read or had this consent read to me. . I understand the contents of this consent, which adequately explains the benefits and risks of the Services being provided via telemedicine.  . I have been provided ample opportunity to ask questions regarding this consent and the Services and have had my questions answered to my satisfaction. . I give my informed  consent for the services to be provided through the use of telemedicine in my medical care  By  participating in this telemedicine visit I agree to the above.

## 2018-10-06 ENCOUNTER — Telehealth (INDEPENDENT_AMBULATORY_CARE_PROVIDER_SITE_OTHER): Payer: Medicare Other | Admitting: Interventional Cardiology

## 2018-10-06 ENCOUNTER — Other Ambulatory Visit: Payer: Self-pay

## 2018-10-06 ENCOUNTER — Encounter: Payer: Self-pay | Admitting: Interventional Cardiology

## 2018-10-06 VITALS — Ht 70.0 in | Wt 240.0 lb

## 2018-10-06 DIAGNOSIS — I10 Essential (primary) hypertension: Secondary | ICD-10-CM

## 2018-10-06 DIAGNOSIS — E782 Mixed hyperlipidemia: Secondary | ICD-10-CM

## 2018-10-06 DIAGNOSIS — E669 Obesity, unspecified: Secondary | ICD-10-CM

## 2018-10-06 DIAGNOSIS — Z6834 Body mass index (BMI) 34.0-34.9, adult: Secondary | ICD-10-CM

## 2018-10-06 DIAGNOSIS — I25118 Atherosclerotic heart disease of native coronary artery with other forms of angina pectoris: Secondary | ICD-10-CM | POA: Diagnosis not present

## 2018-10-06 NOTE — Patient Instructions (Signed)

## 2018-10-06 NOTE — Progress Notes (Signed)
Virtual Visit via Video Note   This visit type was conducted due to national recommendations for restrictions regarding the COVID-19 Pandemic (e.g. social distancing) in an effort to limit this patient's exposure and mitigate transmission in our community.  Due to his co-morbid illnesses, this patient is at least at moderate risk for complications without adequate follow up.  This format is felt to be most appropriate for this patient at this time.  All issues noted in this document were discussed and addressed.  A limited physical exam was performed with this format.  Please refer to the patient's chart for his consent to telehealth for [facility].   Patient unable to access camera.  Date:  10/06/2018   ID:  Erik [name], DOB 13-Jul-1945, MRN [id number]  Patient Location: Home Provider Location: Home  PCP:  Maurice Small, MD  Cardiologist:  No primary care provider on file. [place] Electrophysiologist:  None   Evaluation Performed:  Follow-Up Visit  Chief Complaint:  CAD  History of Present Illness:    Erik [name] is a 73 y.o. male who had progressive anginal symptoms. He underwent cardiac catheterization in April 2017 which revealed a focal lesion in the circumflex. There was a chronic total occlusion of the RCA with left to right collaterals. Due to the lack of LAD disease noted, he was not referred for bypass and the plan was for medical therapy. He returns today for follow-up.  Of note, there is difficulty completing the heart cath from his right radial approach.  Cath in 2017, it was noted at the time of cath that his RCA is not a great candidate for percutaneous intervention of the CTO because there are multiple branch vessels which potentially would be lost, along this long chronic total occlusion.  Isosorbide was increased in April 2017. After this, he was better in terms of chest pain.    I recommended against testosterone supplement in the past.  He has  lost 40 lbs between June 2017 until 10/18.   He has had cataract surgery.  Since the last visit: He has been walking less.  He has been staying home more due to COVID-19.   The patient does not have symptoms concerning for COVID-19 infection (fever, chills, cough, or new shortness of breath).   Denies :  Dizziness. Leg edema. Orthopnea. Palpitations. Paroxysmal nocturnal dyspnea. Shortness of breath. Syncope.   Rare CP and NTG use.  Not sure if it is cardiac but he takes one just in case.  He is now able to walk in stores rather than using a motorized chair.   Past Medical History:  Diagnosis Date  . History of echocardiogram    Echo 4/17 - mild concentric LVH, EF 55-60%, no RWMA, Gr 1 DD, mild AI, mild MR, mild LAE  . Hypertension   . Kidney stone   . Obesity   . Tinea corporis    Past Surgical History:  Procedure Laterality Date  . CARDIAC CATHETERIZATION N/A 07/13/2015   Procedure: Left Heart Cath and Coronary Angiography;  Surgeon: Jettie Booze, MD;  Location: [place] [facility];  Service: Cardiovascular;  Laterality: N/A;  . TOTAL KNEE ARTHROPLASTY Bilateral      Current Meds  Medication Sig  . aspirin 81 MG tablet Take 81 mg by mouth daily.  Marland Kitchen atorvastatin (LIPITOR) 10 MG tablet TAKE 1 TABLET BY MOUTH EVERY DAY  . Calcium Citrate-Vitamin D (CALCIUM + D PO) Take 1 capsule by mouth as directed. Takes 600 mg as  directed  . calcium-vitamin D (OSCAL WITH D) 500-200 MG-UNIT per tablet Take 1 tablet by mouth daily.  . Cholecalciferol (VITAMIN D3) 5000 units CAPS Take 1 capsule by mouth as directed.  . clonazePAM (KLONOPIN) 1 MG tablet Take 1 mg by mouth at bedtime as needed (for sleep).   . CORAL CALCIUM PO Take 1 capsule by mouth daily.  . ferrous sulfate 324 MG TBEC Take 324 mg by mouth.  . fexofenadine (ALLEGRA ALLERGY) 180 MG tablet Take 180 mg by mouth as directed.  Marland Kitchen FISH OIL-KRILL OIL PO Take 1 tablet by mouth as directed. 1250 mg by mouth daily  . Flaxseed,  Linseed, (FLAXSEED OIL) 1000 MG CAPS Take 1 capsule by mouth as directed.  . folic acid (FOLVITE) 694 MCG tablet Take 400 mcg by mouth daily.  . furosemide (LASIX) 40 MG tablet Take 40 mg by mouth as needed for fluid or edema.  . isosorbide mononitrate (IMDUR) 60 MG 24 hr tablet TAKE 1 AND 1/2 TABLETS BY MOUTH ONCE DAILY  . meloxicam (MOBIC) 15 MG tablet Take 15 mg by mouth daily.  . metoprolol tartrate (LOPRESSOR) 25 MG tablet TAKE 1 TABLET BY MOUTH TWICE A DAY  . Multiple Vitamin (MULITIVITAMIN WITH MINERALS) TABS Take 1 tablet by mouth daily. MEN'S PRIME  . nitroGLYCERIN (NITROSTAT) 0.4 MG SL tablet DISSOLVE 1 TABLET UNDER TONGUE EVERY 5 MINUTES AS NEEDED FOR CHEST PAIN  . omeprazole (PRILOSEC) 40 MG capsule Take 40 mg by mouth daily.  . ramipril (ALTACE) 5 MG capsule TAKE 1 CAPSULE BY MOUTH EVERY DAY  . ranolazine (RANEXA) 500 MG 12 hr tablet Take 1 tablet (500 mg total) by mouth daily.  . Saw Palmetto 450 MG CAPS Take 1 capsule by mouth daily.  . Selenium 200 MCG CAPS Take 1 capsule by mouth as directed.  Marland Kitchen Ubiquinol 100 MG CAPS Take 1 tablet by mouth as directed.  . vitamin E 400 UNIT capsule Take 400 Units by mouth daily.     Allergies:   Patient has no known allergies.   Social History   Tobacco Use  . Smoking status: Former Research scientist (life sciences)  . Smokeless tobacco: Never Used  Substance Use Topics  . Alcohol use: Not on file  . Drug use: Not on file     Family Hx: The patient's family history includes Heart disease in his mother; Prostate cancer in his father. There is no history of Heart attack, Stroke, or Hypertension.  ROS:   Please see the history of present illness.    Tolerating meds well All other systems reviewed and are negative.   Prior CV studies:   The following studies were reviewed today:  Cath results as noted; LDL 75 in 04/2018/  Labs/Other Tests and Data Reviewed:    EKG:  An ECG dated 2019 was personally reviewed today and demonstrated:  NSR, PRWP  Recent  Labs: No results found for requested labs within last 8760 hours.   Recent Lipid Panel Lab Results  Component Value Date/Time   CHOL 198 07/05/2009 09:57 AM   TRIG 98.0 07/05/2009 09:57 AM   HDL 64.50 07/05/2009 09:57 AM   CHOLHDL 3 07/05/2009 09:57 AM   LDLCALC 114 (H) 07/05/2009 09:57 AM    Wt Readings from Last 3 Encounters:  10/06/18 240 lb (108.9 kg)  02/25/18 248 lb 9.6 oz (112.8 kg)  09/02/17 247 lb (112 kg)     Objective:    Vital Signs:  Ht 5\' 10"  (1.778 m)   Wt 240  lb (108.9 kg)   BMI 34.44 kg/m    VITAL SIGNS:  reviewed GEN:  no acute distress RESPIRATORY:  normal respiratory effort, symmetric expansion PSYCH:  normal affect exam limited  ASSESSMENT & PLAN:    1. CAD: Angina controlled on medicines.  COntinue current meds.   2. HTN: Check BP at least once a month.  COntinue meds.  3. Hyperlipidemia:  Continue statin.  LDL controlled in 04/2018 4. Obesity: Healthy diet and exercise to the target noted below.  LDL 75  COVID-19 Education: The signs and symptoms of COVID-19 were discussed with the patient and how to seek care for testing (follow up with PCP or arrange E-visit).  The importance of social distancing was discussed today.  Time:   Today, I have spent 25 minutes with the patient with telehealth technology discussing the above problems.     Medication Adjustments/Labs and Tests Ordered: Current medicines are reviewed at length with the patient today.  Concerns regarding medicines are outlined above.   Tests Ordered: No orders of the defined types were placed in this encounter.   Medication Changes: No orders of the defined types were placed in this encounter.   Follow Up:  Virtual Visit or In Person in 6 month(s)  Signed, Larae Grooms, MD  10/06/2018 2:46 PM    [place]

## 2018-10-30 ENCOUNTER — Other Ambulatory Visit: Payer: Self-pay | Admitting: Interventional Cardiology

## 2018-11-12 ENCOUNTER — Other Ambulatory Visit: Payer: Self-pay | Admitting: Interventional Cardiology

## 2018-11-12 DIAGNOSIS — R072 Precordial pain: Secondary | ICD-10-CM

## 2018-11-27 DIAGNOSIS — Z23 Encounter for immunization: Secondary | ICD-10-CM | POA: Diagnosis not present

## 2019-02-07 ENCOUNTER — Other Ambulatory Visit: Payer: Self-pay | Admitting: Interventional Cardiology

## 2019-02-23 DIAGNOSIS — L570 Actinic keratosis: Secondary | ICD-10-CM | POA: Diagnosis not present

## 2019-02-23 DIAGNOSIS — D1801 Hemangioma of skin and subcutaneous tissue: Secondary | ICD-10-CM | POA: Diagnosis not present

## 2019-02-23 DIAGNOSIS — L821 Other seborrheic keratosis: Secondary | ICD-10-CM | POA: Diagnosis not present

## 2019-02-23 DIAGNOSIS — D692 Other nonthrombocytopenic purpura: Secondary | ICD-10-CM | POA: Diagnosis not present

## 2019-02-23 DIAGNOSIS — L920 Granuloma annulare: Secondary | ICD-10-CM | POA: Diagnosis not present

## 2019-03-04 ENCOUNTER — Other Ambulatory Visit: Payer: Self-pay

## 2019-03-04 DIAGNOSIS — Z20822 Contact with and (suspected) exposure to covid-19: Secondary | ICD-10-CM

## 2019-03-04 DIAGNOSIS — Z20828 Contact with and (suspected) exposure to other viral communicable diseases: Secondary | ICD-10-CM | POA: Diagnosis not present

## 2019-03-06 LAB — NOVEL CORONAVIRUS, NAA: SARS-CoV-2, NAA: NOT DETECTED

## 2019-04-18 NOTE — Progress Notes (Signed)
Cardiology Office Note   Date:  04/20/2019   ID:  [name], DOB February 23, 1946, MRN [id number]  PCP:  Maurice Small, MD    No chief complaint on file.  CAD  Wt Readings from Last 3 Encounters:  04/20/19 257 lb (116.6 kg)  10/06/18 240 lb (108.9 kg)  02/25/18 248 lb 9.6 oz (112.8 kg)       History of Present Illness: Erik [name] is a 74 y.o. male   who had progressive anginal symptoms. He underwent cardiac catheterization in April 2017 which revealed a focal lesion in the circumflex. There was a chronic total occlusion of the RCA with left to right collaterals. Due to the lack of LAD disease noted, he was not referred for bypass and the plan was for medical therapy. He returns today for follow-up.  Of note, there is difficulty completing the heart cath from his right radial approach.  Cath in 2017, it was noted at the time of cath that his RCA is not a great candidate for percutaneous intervention of the CTO because there are multiple branch vessels which potentially would be lost, along this long chronic total occlusion.  Isosorbide was increased in April 2017. After this, he was better in terms of chest pain.    I recommended against testosterone supplement in the past.  He has lost 40 lbs between June 2017 until 10/18.  He has had cataract surgery.  Noted in 09/2018: "Rare CP and NTG use.  Not sure if it is cardiac but he takes one just in case.  He is now able to walk in stores rather than using a motorized chair."  Since the last visit, he has been doing ok.  He feels that he is fatigued since he is exercising less. Did some yard work recently and felt well.  Walks in the grocery store, walks dog 3x/day wothout difficulty.   Denies : Chest pain. Dizziness. Leg edema. Nitroglycerin use. Orthopnea. Palpitations. Paroxysmal nocturnal dyspnea. Shortness of breath. Syncope.    Past Medical History:  Diagnosis Date  . History of echocardiogram    Echo 4/17 -  mild concentric LVH, EF 55-60%, no RWMA, Gr 1 DD, mild AI, mild MR, mild LAE  . Hypertension   . Kidney stone   . Obesity   . Tinea corporis     Past Surgical History:  Procedure Laterality Date  . CARDIAC CATHETERIZATION N/A 07/13/2015   Procedure: Left Heart Cath and Coronary Angiography;  Surgeon: Jettie Booze, MD;  Location: [facility];  Service: Cardiovascular;  Laterality: N/A;  . TOTAL KNEE ARTHROPLASTY Bilateral      Current Outpatient Medications  Medication Sig Dispense Refill  . aspirin 81 MG tablet Take 81 mg by mouth daily.    Marland Kitchen atorvastatin (LIPITOR) 10 MG tablet TAKE 1 TABLET BY MOUTH EVERY DAY 90 tablet 1  . B Complex-C (SUPER B COMPLEX PO) Take 1 tablet by mouth daily.    . Calcium Citrate-Vitamin D (CALCIUM + D PO) Take 1 capsule by mouth as directed. Takes 600 mg as directed    . calcium-vitamin D (OSCAL WITH D) 500-200 MG-UNIT per tablet Take 1 tablet by mouth daily.    . Cholecalciferol (VITAMIN D3) 5000 units CAPS Take 1 capsule by mouth as directed.    . clonazePAM (KLONOPIN) 1 MG tablet Take 1 mg by mouth at bedtime as needed (for sleep).     . CORAL CALCIUM PO Take 1 capsule by mouth daily.    Marland Kitchen  ferrous sulfate 324 MG TBEC Take 324 mg by mouth.    . fexofenadine (ALLEGRA ALLERGY) 180 MG tablet Take 180 mg by mouth as directed.    Marland Kitchen FISH OIL-KRILL OIL PO Take 1 tablet by mouth as directed. 1250 mg by mouth daily    . Flaxseed, Linseed, (FLAXSEED OIL) 1000 MG CAPS Take 1 capsule by mouth as directed.    . folic acid (FOLVITE) [id number] MCG tablet Take 400 mcg by mouth daily.    . furosemide (LASIX) 40 MG tablet Take 40 mg by mouth as needed for fluid or edema.    . isosorbide mononitrate (IMDUR) 60 MG 24 hr tablet TAKE 1 AND 1/2 TABLETS BY MOUTH ONCE DAILY 135 tablet 3  . meloxicam (MOBIC) 15 MG tablet Take 15 mg by mouth daily.  0  . metoprolol tartrate (LOPRESSOR) 25 MG tablet TAKE 1 TABLET BY MOUTH TWICE A DAY 180 tablet 1  . Multiple Vitamin  (MULITIVITAMIN WITH MINERALS) TABS Take 1 tablet by mouth daily. MEN'S PRIME    . nitroGLYCERIN (NITROSTAT) 0.4 MG SL tablet DISSOLVE 1 TABLET UNDER TONGUE EVERY 5 MINUTES AS NEEDED FOR CHEST PAIN 25 tablet 4  . omeprazole (PRILOSEC) 40 MG capsule Take 40 mg by mouth daily.    . ramipril (ALTACE) 5 MG capsule TAKE 1 CAPSULE BY MOUTH EVERY DAY 90 capsule 1  . ranolazine (RANEXA) 500 MG 12 hr tablet TAKE 1 TABLET BY MOUTH TWICE A DAY 180 tablet 3  . Saw Palmetto 450 MG CAPS Take 1 capsule by mouth daily.    . Selenium 200 MCG CAPS Take 1 capsule by mouth as directed.    Marland Kitchen Ubiquinol 100 MG CAPS Take 1 tablet by mouth as directed.    . vitamin E 400 UNIT capsule Take 400 Units by mouth daily.     No current facility-administered medications for this visit.    Allergies:   Patient has no known allergies.    Social History:  The patient  reports that he has quit smoking. He has never used smokeless tobacco.   Family History:  The patient's family history includes Heart disease in his mother; Prostate cancer in his father.    ROS:  Please see the history of present illness.   Otherwise, review of systems are positive for weight gain.   All other systems are reviewed and negative.    PHYSICAL EXAM: VS:  BP 114/70   Pulse 67   Ht 5\' 10"  (1.778 m)   Wt 257 lb (116.6 kg)   SpO2 97%   BMI 36.88 kg/m  , BMI Body mass index is 36.88 kg/m. GEN: Well nourished, well developed, in no acute distress  HEENT: normal  Neck: no JVD, carotid bruits, or masses Cardiac: RRR; no murmurs, rubs, or gallops,no edema  Respiratory:  clear to auscultation bilaterally, normal work of breathing GI: soft, nontender, nondistended, + BS MS: no deformity or atrophy, 2+ PT pulses bilaterally  Skin: warm and dry, no rash Neuro:  Strength and sensation are intact Psych: euthymic mood, full affect   EKG:   The ekg ordered today demonstrates NSR, poor R wave progression, inferior T wave inversion- unchanged from  2019.   Recent Labs: No results found for requested labs within last 8760 hours.   Lipid Panel    Component Value Date/Time   CHOL 198 07/05/2009 0957   TRIG 98.0 07/05/2009 0957   HDL 64.50 07/05/2009 0957   CHOLHDL 3 07/05/2009 0957   VLDL 19.6  07/05/2009 0957   LDLCALC 114 (H) 07/05/2009 0957     Other studies Reviewed: Additional studies/ records that were reviewed today with results demonstrating: labs reviewed; LDL 75 in 04/2018.   ASSESSMENT AND PLAN:  1. CAD: Known disease in the ramus and CTO RCA.  Moderate LAD and diagonal disease.  He has been managed medically.  I reviewed the cath films today.  It was noted that right radial cath is difficult.  WOuld use groin approach if cath needed in the future.  2. HTN: Continue to check blood pressure at home.  Try to increase exercise and maintain weight loss.  The current medical regimen is effective;  continue present plan and medications. 3. Hyperlipidemia: Would like to see LDL below 100 given coronary artery disease.  Check lipids.   4. Obesity: Whole food plant-based diet recommended.  Increase exercise.   Current medicines are reviewed at length with the patient today.  The patient concerns regarding his medicines were addressed.  The following changes have been made:  No change  Labs/ tests ordered today include:  No orders of the defined types were placed in this encounter.   Recommend 150 minutes/week of aerobic exercise Low fat, low carb, high fiber diet recommended  Disposition:   FU in 6 months   Signed, Larae Grooms, MD  04/20/2019 2:04 PM    [facility] [address], [place], [place]  [address] Phone: [phone]; Fax: [phone]

## 2019-04-20 ENCOUNTER — Other Ambulatory Visit: Payer: Self-pay

## 2019-04-20 ENCOUNTER — Ambulatory Visit (INDEPENDENT_AMBULATORY_CARE_PROVIDER_SITE_OTHER): Payer: Medicare Other | Admitting: Interventional Cardiology

## 2019-04-20 ENCOUNTER — Encounter: Payer: Self-pay | Admitting: Interventional Cardiology

## 2019-04-20 VITALS — BP 114/70 | HR 67 | Ht 70.0 in | Wt 257.0 lb

## 2019-04-20 DIAGNOSIS — I10 Essential (primary) hypertension: Secondary | ICD-10-CM | POA: Diagnosis not present

## 2019-04-20 DIAGNOSIS — E782 Mixed hyperlipidemia: Secondary | ICD-10-CM | POA: Diagnosis not present

## 2019-04-20 DIAGNOSIS — I25118 Atherosclerotic heart disease of native coronary artery with other forms of angina pectoris: Secondary | ICD-10-CM | POA: Diagnosis not present

## 2019-04-20 NOTE — Patient Instructions (Signed)
Medication Instructions:  Your physician recommends that you continue on your current medications as directed. Please refer to the Current Medication list given to you today.  *If you need a refill on your cardiac medications before your next appointment, please call your pharmacy*  Lab Work: Your physician recommends that you return for a FASTING LIPIDS, CMET, and CBC  If you have labs (blood work) drawn today and your tests are completely normal, you will receive your results only by: Marland Kitchen MyChart Message (if you have MyChart) OR . A paper copy in the mail If you have any lab test that is abnormal or we need to change your treatment, we will call you to review the results.  Testing/Procedures: None ordered  Follow-Up: At [facility], you and your health needs are our priority.  As part of our continuing mission to provide you with exceptional heart care, we have created designated Provider Care Teams.  These Care Teams include your primary Cardiologist (physician) and Advanced Practice Providers (APPs -  Physician Assistants and Nurse Practitioners) who all work together to provide you with the care you need, when you need it.  Your next appointment:   6 month(s)  The format for your next appointment:   In Person  Provider:   You may see Larae Grooms, MD or one of the following Advanced Practice Providers on your designated Care Team:    Melina Copa, PA-C  Ermalinda Barrios, PA-C   Other Instructions

## 2019-04-22 ENCOUNTER — Other Ambulatory Visit: Payer: Self-pay

## 2019-04-22 ENCOUNTER — Other Ambulatory Visit: Payer: Medicare Other | Admitting: *Deleted

## 2019-04-22 DIAGNOSIS — I25118 Atherosclerotic heart disease of native coronary artery with other forms of angina pectoris: Secondary | ICD-10-CM

## 2019-04-22 DIAGNOSIS — E782 Mixed hyperlipidemia: Secondary | ICD-10-CM

## 2019-04-22 DIAGNOSIS — I10 Essential (primary) hypertension: Secondary | ICD-10-CM | POA: Diagnosis not present

## 2019-04-22 LAB — CBC
Hematocrit: 44.5 % (ref 37.5–51.0)
Hemoglobin: 15.4 g/dL (ref 13.0–17.7)
MCH: 33.8 pg — ABNORMAL HIGH (ref 26.6–33.0)
MCHC: 34.6 g/dL (ref 31.5–35.7)
MCV: 98 fL — ABNORMAL HIGH (ref 79–97)
Platelets: 227 10*3/uL (ref 150–450)
RBC: 4.56 x10E6/uL (ref 4.14–5.80)
RDW: 12.6 % (ref 11.6–15.4)
WBC: 8.1 10*3/uL (ref 3.4–10.8)

## 2019-04-22 LAB — COMPREHENSIVE METABOLIC PANEL
ALT: 32 IU/L (ref 0–44)
AST: 34 IU/L (ref 0–40)
Albumin/Globulin Ratio: 1.7 (ref 1.2–2.2)
Albumin: 4.3 g/dL (ref 3.7–4.7)
Alkaline Phosphatase: 85 IU/L (ref 39–117)
BUN/Creatinine Ratio: 14 (ref 10–24)
BUN: 16 mg/dL (ref 8–27)
Bilirubin Total: 0.8 mg/dL (ref 0.0–1.2)
CO2: 21 mmol/L (ref 20–29)
Calcium: 9.5 mg/dL (ref 8.6–10.2)
Chloride: 102 mmol/L (ref 96–106)
Creatinine, Ser: 1.13 mg/dL (ref 0.76–1.27)
GFR calc Af Amer: 74 mL/min/{1.73_m2} (ref >59)
GFR calc non Af Amer: 64 mL/min/{1.73_m2} (ref >59)
Globulin, Total: 2.5 g/dL (ref 1.5–4.5)
Glucose: 97 mg/dL (ref 65–99)
Potassium: 5.1 mmol/L (ref 3.5–5.2)
Sodium: 140 mmol/L (ref 134–144)
Total Protein: 6.8 g/dL (ref 6.0–8.5)

## 2019-04-22 LAB — LIPID PANEL
Chol/HDL Ratio: 2 ratio (ref 0.0–5.0)
Cholesterol, Total: 174 mg/dL (ref 100–199)
HDL: 87 mg/dL (ref >39)
LDL Chol Calc (NIH): 74 mg/dL (ref 0–99)
Triglycerides: 70 mg/dL (ref 0–149)
VLDL Cholesterol Cal: 13 mg/dL (ref 5–40)

## 2019-07-29 ENCOUNTER — Other Ambulatory Visit: Payer: Self-pay | Admitting: Interventional Cardiology

## 2019-07-30 ENCOUNTER — Other Ambulatory Visit: Payer: Self-pay | Admitting: Interventional Cardiology

## 2019-11-22 DIAGNOSIS — Z23 Encounter for immunization: Secondary | ICD-10-CM | POA: Diagnosis not present

## 2019-11-26 ENCOUNTER — Other Ambulatory Visit: Payer: Self-pay | Admitting: Interventional Cardiology

## 2019-11-26 DIAGNOSIS — R072 Precordial pain: Secondary | ICD-10-CM

## 2019-12-20 DIAGNOSIS — Z23 Encounter for immunization: Secondary | ICD-10-CM | POA: Diagnosis not present

## 2020-01-13 ENCOUNTER — Encounter: Payer: Self-pay | Admitting: Interventional Cardiology

## 2020-01-13 ENCOUNTER — Ambulatory Visit (INDEPENDENT_AMBULATORY_CARE_PROVIDER_SITE_OTHER): Payer: Medicare Other | Admitting: Interventional Cardiology

## 2020-01-13 ENCOUNTER — Other Ambulatory Visit: Payer: Self-pay

## 2020-01-13 VITALS — BP 124/76 | HR 76 | Ht 70.0 in | Wt 248.6 lb

## 2020-01-13 DIAGNOSIS — I25118 Atherosclerotic heart disease of native coronary artery with other forms of angina pectoris: Secondary | ICD-10-CM | POA: Diagnosis not present

## 2020-01-13 DIAGNOSIS — E782 Mixed hyperlipidemia: Secondary | ICD-10-CM | POA: Diagnosis not present

## 2020-01-13 DIAGNOSIS — R0600 Dyspnea, unspecified: Secondary | ICD-10-CM

## 2020-01-13 DIAGNOSIS — R0609 Other forms of dyspnea: Secondary | ICD-10-CM

## 2020-01-13 DIAGNOSIS — R5383 Other fatigue: Secondary | ICD-10-CM | POA: Diagnosis not present

## 2020-01-13 DIAGNOSIS — I10 Essential (primary) hypertension: Secondary | ICD-10-CM

## 2020-01-13 NOTE — Patient Instructions (Addendum)
Medication Instructions:  Your physician recommends that you continue on your current medications as directed. Please refer to the Current Medication list given to you today.  *If you need a refill on your cardiac medications before your next appointment, please call your pharmacy*   Lab Work: TODAY: BMET, BNP, TSH, CBC  If you have labs (blood work) drawn today and your tests are completely normal, you will receive your results only by: Marland Kitchen MyChart Message (if you have MyChart) OR . A paper copy in the mail If you have any lab test that is abnormal or we need to change your treatment, we will call you to review the results.   Testing/Procedures: Your physician has requested that you have an echocardiogram. Echocardiography is a painless test that uses sound waves to create images of your heart. It provides your doctor with information about the size and shape of your heart and how well your heart's chambers and valves are working. This procedure takes approximately one hour. There are no restrictions for this procedure.  Follow-Up: At [facility], you and your health needs are our priority.  As part of our continuing mission to provide you with exceptional heart care, we have created designated Provider Care Teams.  These Care Teams include your primary Cardiologist (physician) and Advanced Practice Providers (APPs -  Physician Assistants and Nurse Practitioners) who all work together to provide you with the care you need, when you need it.  We recommend signing up for the patient portal called "MyChart".  Sign up information is provided on this After Visit Summary.  MyChart is used to connect with patients for Virtual Visits (Telemedicine).  Patients are able to view lab/test results, encounter notes, upcoming appointments, etc.  Non-urgent messages can be sent to your provider as well.   To learn more about what you can do with MyChart, go to [email or web address].    Your next  appointment:   6 month(s)  The format for your next appointment:   In Person  Provider:   You may see Larae Grooms, MD or one of the following Advanced Practice Providers on your designated Care Team:    Melina Copa, PA-C  Ermalinda Barrios, PA-C    Other Instructions None

## 2020-01-13 NOTE — Progress Notes (Signed)
Cardiology Office Note   Date:  01/13/2020   ID:  Erik [name], DOB 18-Sep-1945, MRN [id number]  PCP:  Maurice Small, MD    No chief complaint on file.  CAD  Wt Readings from Last 3 Encounters:  01/13/20 248 lb 9.6 oz (112.8 kg)  04/20/19 257 lb (116.6 kg)  10/06/18 240 lb (108.9 kg)       History of Present Illness: Erik [name] is a 74 y.o. male   who had progressive anginal symptoms. He underwent cardiac catheterization in April 2017 which revealed a focal lesion in the circumflex. There was a chronic total occlusion of the RCA with left to right collaterals. Due to the lack of LAD disease noted, he was not referred for bypass and the plan was for medical therapy. He returns today for follow-up.  Of note, there is difficulty completing the heart cath from his right radial approach.  Cath in 2017, it was noted at the time of cath that his RCA is not a great candidate for percutaneous intervention of the CTO because there are multiple branch vessels which potentially would be lost, along this long chronic total occlusion.  Isosorbide was increased in April 2017. After this, he was better in terms of chest pain.    I recommended against testosterone supplement in the past.  He has lost 40 lbs between June 2017 until 10/18.  He has had cataract surgery.  Noted in 09/2018: "Rare CP and NTG use. Not sure if it is cardiac but he takes one just in case. He is now able to walk in stores rather than using a motorized chair."  I saw his wife yesterday, who is also a patient of mine and she reported that he is not exercising much and he eats too much butter.   Since the last visit, he feels ok.  He has chronic DOE, particularly with stairs. He continues to walk the dog, no problems on flat ground.  Has some DOE with walking the dog.   Denies : Dizziness. Leg edema.  Orthopnea. Palpitations. Paroxysmal nocturnal dyspnea. Syncope.   Rare NTG use when he feels  something in his chest.  Sx resolve quickly. Never had to use NTG while walking.   Exertion limited by hip and leg pain.     Past Medical History:  Diagnosis Date   History of echocardiogram    Echo 4/17 - mild concentric LVH, EF 55-60%, no RWMA, Gr 1 DD, mild AI, mild MR, mild LAE   Hypertension    Kidney stone    Obesity    Tinea corporis     Past Surgical History:  Procedure Laterality Date   CARDIAC CATHETERIZATION N/A 07/13/2015   Procedure: Left Heart Cath and Coronary Angiography;  Surgeon: Jettie Booze, MD;  Location: [facility];  Service: Cardiovascular;  Laterality: N/A;   TOTAL KNEE ARTHROPLASTY Bilateral      Current Outpatient Medications  Medication Sig Dispense Refill   aspirin 81 MG tablet Take 81 mg by mouth daily.     atorvastatin (LIPITOR) 10 MG tablet TAKE 1 TABLET BY MOUTH EVERY DAY 90 tablet 2   B Complex-C (SUPER B COMPLEX PO) Take 1 tablet by mouth daily.     Calcium Citrate-Vitamin D (CALCIUM + D PO) Take 1 capsule by mouth as directed. Takes 600 mg as directed     calcium-vitamin D (OSCAL WITH D) 500-200 MG-UNIT per tablet Take 1 tablet by mouth daily.  Cholecalciferol (VITAMIN D3) 5000 units CAPS Take 1 capsule by mouth as directed.     clonazePAM (KLONOPIN) 1 MG tablet Take 1 mg by mouth at bedtime as needed (for sleep).      CORAL CALCIUM PO Take 1 capsule by mouth daily.     ferrous sulfate 324 MG TBEC Take 324 mg by mouth.     fexofenadine (ALLEGRA ALLERGY) 180 MG tablet Take 180 mg by mouth as directed.     FISH OIL-KRILL OIL PO Take 1 tablet by mouth as directed. 1250 mg by mouth daily     Flaxseed, Linseed, (FLAXSEED OIL) 1000 MG CAPS Take 1 capsule by mouth as directed.     folic acid (FOLVITE) 024 MCG tablet Take 400 mcg by mouth daily.     furosemide (LASIX) 40 MG tablet Take 40 mg by mouth as needed for fluid or edema.     isosorbide mononitrate (IMDUR) 60 MG 24 hr tablet TAKE 1 AND 1/2 TABLETS BY  MOUTH ONCE DAILY 135 tablet 1   meloxicam (MOBIC) 15 MG tablet Take 15 mg by mouth daily.  0   metoprolol tartrate (LOPRESSOR) 25 MG tablet TAKE 1 TABLET BY MOUTH TWICE A DAY 180 tablet 2   Multiple Vitamin (MULITIVITAMIN WITH MINERALS) TABS Take 1 tablet by mouth daily. MEN'S PRIME     nitroGLYCERIN (NITROSTAT) 0.4 MG SL tablet DISSOLVE 1 TABLET UNDER TONGUE EVERY 5 MINUTES AS NEEDED FOR CHEST PAIN 25 tablet 4   omeprazole (PRILOSEC) 40 MG capsule Take 40 mg by mouth daily.     ramipril (ALTACE) 5 MG capsule TAKE 1 CAPSULE BY MOUTH EVERY DAY 90 capsule 2   ranolazine (RANEXA) 500 MG 12 hr tablet TAKE 1 TABLET BY MOUTH TWICE A DAY 180 tablet 3   Saw Palmetto 450 MG CAPS Take 1 capsule by mouth daily.     Selenium 200 MCG CAPS Take 1 capsule by mouth as directed.     Ubiquinol 100 MG CAPS Take 1 tablet by mouth as directed.     vitamin E 400 UNIT capsule Take 400 Units by mouth daily.     No current facility-administered medications for this visit.    Allergies:   Patient has no known allergies.    Social History:  The patient  reports that he has quit smoking. He has never used smokeless tobacco.   Family History:  The patient's family history includes Heart disease in his mother; Prostate cancer in his father.    ROS:  Please see the history of present illness.   Otherwise, review of systems are positive for DOE.   All other systems are reviewed and negative.    PHYSICAL EXAM: VS:  BP 124/76    Pulse 76    Ht 5\' 10"  (1.778 m)    Wt 248 lb 9.6 oz (112.8 kg)    SpO2 95%    BMI 35.67 kg/m  , BMI Body mass index is 35.67 kg/m. GEN: Well nourished, well developed, in no acute distress  HEENT: normal  Neck: no JVD, carotid bruits, or masses Cardiac: RRR; no murmurs, rubs, or gallops,no edema  Respiratory:  clear to auscultation bilaterally, normal work of breathing GI: soft, nontender, nondistended, + BS, obese MS: no deformity or atrophy  Skin: warm and dry, no  rash Neuro:  Strength and sensation are intact Psych: euthymic mood, full affect    Recent Labs: 04/22/2019: ALT 32; BUN 16; Creatinine, Ser 1.13; Hemoglobin 15.4; Platelets 227; Potassium 5.1; Sodium  140   Lipid Panel    Component Value Date/Time   CHOL 174 04/22/2019 1045   TRIG 70 04/22/2019 1045   HDL 87 04/22/2019 1045   CHOLHDL 2.0 04/22/2019 1045   CHOLHDL 3 07/05/2009 0957   VLDL 19.6 07/05/2009 0957   LDLCALC 74 04/22/2019 1045     Other studies Reviewed: Additional studies/ records that were reviewed today with results demonstrating: prior labs and cath results reviewed.     ASSESSMENT AND PLAN:  1. CAD: Some DOE- could be anginal equivalent.  Check echo and BMet and BNP.  Check TSH given fatigue.  No exertional chest pain.  He just feels that his energy level has dropped.   2. HTN: The current medical regimen is effective;  continue present plan and medications.  Low salt diet recommended.  3. Hyperlipidemia: LDL 74 in 1/21. 4. Obesity: Whole food, plant based diet recommended.   We talked about his butter consumption.    Current medicines are reviewed at length with the patient today.  The patient concerns regarding his medicines were addressed.  The following changes have been made:  No change  Labs/ tests ordered today include:  No orders of the defined types were placed in this encounter.   Recommend 150 minutes/week of aerobic exercise Low fat, low carb, high fiber diet recommended  Disposition:   FU in 6 months   Signed, Larae Grooms, MD  01/13/2020 3:00 PM    [facility] [address], [place],   [address] Phone: [phone]; Fax: [phone]

## 2020-01-14 LAB — BASIC METABOLIC PANEL
BUN/Creatinine Ratio: 18 (ref 10–24)
BUN: 19 mg/dL (ref 8–27)
CO2: 22 mmol/L (ref 20–29)
Calcium: 10 mg/dL (ref 8.6–10.2)
Chloride: 101 mmol/L (ref 96–106)
Creatinine, Ser: 1.06 mg/dL (ref 0.76–1.27)
GFR calc Af Amer: 79 mL/min/{1.73_m2} (ref >59)
GFR calc non Af Amer: 68 mL/min/{1.73_m2} (ref >59)
Glucose: 103 mg/dL — ABNORMAL HIGH (ref 65–99)
Potassium: 4.9 mmol/L (ref 3.5–5.2)
Sodium: 140 mmol/L (ref 134–144)

## 2020-01-14 LAB — CBC
Hematocrit: 44.9 % (ref 37.5–51.0)
Hemoglobin: 15.5 g/dL (ref 13.0–17.7)
MCH: 34.1 pg — ABNORMAL HIGH (ref 26.6–33.0)
MCHC: 34.5 g/dL (ref 31.5–35.7)
MCV: 99 fL — ABNORMAL HIGH (ref 79–97)
Platelets: 241 10*3/uL (ref 150–450)
RBC: 4.55 x10E6/uL (ref 4.14–5.80)
RDW: 12.5 % (ref 11.6–15.4)
WBC: 10.5 10*3/uL (ref 3.4–10.8)

## 2020-01-14 LAB — PRO B NATRIURETIC PEPTIDE: NT-Pro BNP: 230 pg/mL (ref 0–486)

## 2020-01-14 LAB — TSH: TSH: 1.29 u[IU]/mL (ref 0.450–4.500)

## 2020-01-23 ENCOUNTER — Other Ambulatory Visit: Payer: Self-pay | Admitting: Interventional Cardiology

## 2020-01-25 ENCOUNTER — Other Ambulatory Visit: Payer: Self-pay | Admitting: Interventional Cardiology

## 2020-02-04 ENCOUNTER — Other Ambulatory Visit: Payer: Self-pay

## 2020-02-04 ENCOUNTER — Telehealth: Payer: Self-pay | Admitting: Interventional Cardiology

## 2020-02-04 ENCOUNTER — Ambulatory Visit (HOSPITAL_COMMUNITY): Payer: Medicare Other | Attending: Interventional Cardiology

## 2020-02-04 DIAGNOSIS — R0600 Dyspnea, unspecified: Secondary | ICD-10-CM

## 2020-02-04 DIAGNOSIS — I10 Essential (primary) hypertension: Secondary | ICD-10-CM | POA: Diagnosis not present

## 2020-02-04 DIAGNOSIS — I25118 Atherosclerotic heart disease of native coronary artery with other forms of angina pectoris: Secondary | ICD-10-CM | POA: Diagnosis not present

## 2020-02-04 DIAGNOSIS — E782 Mixed hyperlipidemia: Secondary | ICD-10-CM | POA: Insufficient documentation

## 2020-02-04 DIAGNOSIS — R0609 Other forms of dyspnea: Secondary | ICD-10-CM

## 2020-02-04 LAB — ECHOCARDIOGRAM COMPLETE
Area-P 1/2: 2.39 cm2
P 1/2 time: 541 msec
S' Lateral: 2.9 cm

## 2020-02-04 NOTE — Telephone Encounter (Signed)
Called and made patient aware that LIPIDS were checked in January 2021. Made him aware that we typically only check once a year. Patient has an appointment coming up with PCP in the next couple of months and will have it checked then.

## 2020-02-04 NOTE — Telephone Encounter (Signed)
New Message:     Pt wants to know if Dr Irish Lack have order him a Lipid Profile recently?

## 2020-03-06 DIAGNOSIS — D692 Other nonthrombocytopenic purpura: Secondary | ICD-10-CM | POA: Diagnosis not present

## 2020-03-06 DIAGNOSIS — L814 Other melanin hyperpigmentation: Secondary | ICD-10-CM | POA: Diagnosis not present

## 2020-03-06 DIAGNOSIS — L821 Other seborrheic keratosis: Secondary | ICD-10-CM | POA: Diagnosis not present

## 2020-03-06 DIAGNOSIS — L820 Inflamed seborrheic keratosis: Secondary | ICD-10-CM | POA: Diagnosis not present

## 2020-03-06 DIAGNOSIS — L578 Other skin changes due to chronic exposure to nonionizing radiation: Secondary | ICD-10-CM | POA: Diagnosis not present

## 2020-03-06 DIAGNOSIS — D1801 Hemangioma of skin and subcutaneous tissue: Secondary | ICD-10-CM | POA: Diagnosis not present

## 2020-03-06 DIAGNOSIS — L218 Other seborrheic dermatitis: Secondary | ICD-10-CM | POA: Diagnosis not present

## 2020-03-22 DIAGNOSIS — Z0000 Encounter for general adult medical examination without abnormal findings: Secondary | ICD-10-CM | POA: Diagnosis not present

## 2020-03-22 DIAGNOSIS — I10 Essential (primary) hypertension: Secondary | ICD-10-CM | POA: Diagnosis not present

## 2020-03-22 DIAGNOSIS — I25118 Atherosclerotic heart disease of native coronary artery with other forms of angina pectoris: Secondary | ICD-10-CM | POA: Diagnosis not present

## 2020-03-22 DIAGNOSIS — F321 Major depressive disorder, single episode, moderate: Secondary | ICD-10-CM | POA: Diagnosis not present

## 2020-03-22 DIAGNOSIS — D500 Iron deficiency anemia secondary to blood loss (chronic): Secondary | ICD-10-CM | POA: Diagnosis not present

## 2020-03-22 DIAGNOSIS — R7303 Prediabetes: Secondary | ICD-10-CM | POA: Diagnosis not present

## 2020-03-22 DIAGNOSIS — E291 Testicular hypofunction: Secondary | ICD-10-CM | POA: Diagnosis not present

## 2020-03-22 DIAGNOSIS — Z6839 Body mass index (BMI) 39.0-39.9, adult: Secondary | ICD-10-CM | POA: Diagnosis not present

## 2020-03-22 DIAGNOSIS — K2100 Gastro-esophageal reflux disease with esophagitis, without bleeding: Secondary | ICD-10-CM | POA: Diagnosis not present

## 2020-03-22 DIAGNOSIS — K219 Gastro-esophageal reflux disease without esophagitis: Secondary | ICD-10-CM | POA: Diagnosis not present

## 2020-03-22 DIAGNOSIS — M109 Gout, unspecified: Secondary | ICD-10-CM | POA: Diagnosis not present

## 2020-03-22 DIAGNOSIS — Z8042 Family history of malignant neoplasm of prostate: Secondary | ICD-10-CM | POA: Diagnosis not present

## 2020-04-20 ENCOUNTER — Other Ambulatory Visit: Payer: Self-pay | Admitting: Interventional Cardiology

## 2020-05-24 ENCOUNTER — Other Ambulatory Visit: Payer: Self-pay | Admitting: Family Medicine

## 2020-05-24 ENCOUNTER — Ambulatory Visit
Admission: RE | Admit: 2020-05-24 | Discharge: 2020-05-24 | Disposition: A | Discharge status: Home / Self Care | Payer: Medicare Other | Source: Ambulatory Visit | Attending: Family Medicine | Admitting: Family Medicine

## 2020-05-24 DIAGNOSIS — M545 Low back pain, unspecified: Secondary | ICD-10-CM | POA: Diagnosis not present

## 2020-05-24 DIAGNOSIS — M5442 Lumbago with sciatica, left side: Secondary | ICD-10-CM

## 2020-05-24 DIAGNOSIS — M25552 Pain in left hip: Secondary | ICD-10-CM

## 2020-06-07 DIAGNOSIS — M5136 Other intervertebral disc degeneration, lumbar region: Secondary | ICD-10-CM | POA: Diagnosis not present

## 2020-06-07 DIAGNOSIS — G894 Chronic pain syndrome: Secondary | ICD-10-CM | POA: Diagnosis not present

## 2020-06-07 DIAGNOSIS — M47817 Spondylosis without myelopathy or radiculopathy, lumbosacral region: Secondary | ICD-10-CM | POA: Diagnosis not present

## 2020-06-07 DIAGNOSIS — M7062 Trochanteric bursitis, left hip: Secondary | ICD-10-CM | POA: Diagnosis not present

## 2020-06-07 DIAGNOSIS — Z79899 Other long term (current) drug therapy: Secondary | ICD-10-CM | POA: Diagnosis not present

## 2020-06-07 DIAGNOSIS — Z79891 Long term (current) use of opiate analgesic: Secondary | ICD-10-CM | POA: Diagnosis not present

## 2020-06-08 ENCOUNTER — Other Ambulatory Visit: Payer: Self-pay | Admitting: Pain Medicine

## 2020-06-08 DIAGNOSIS — M545 Low back pain, unspecified: Secondary | ICD-10-CM

## 2020-06-08 DIAGNOSIS — M25559 Pain in unspecified hip: Secondary | ICD-10-CM

## 2020-06-30 ENCOUNTER — Ambulatory Visit
Admission: RE | Admit: 2020-06-30 | Discharge: 2020-06-30 | Disposition: A | Discharge status: Home / Self Care | Payer: Medicare Other | Source: Ambulatory Visit | Attending: Pain Medicine | Admitting: Pain Medicine

## 2020-06-30 ENCOUNTER — Other Ambulatory Visit: Payer: Self-pay | Admitting: Pain Medicine

## 2020-06-30 DIAGNOSIS — M545 Low back pain, unspecified: Secondary | ICD-10-CM | POA: Diagnosis not present

## 2020-06-30 DIAGNOSIS — M25559 Pain in unspecified hip: Secondary | ICD-10-CM

## 2020-06-30 DIAGNOSIS — M25552 Pain in left hip: Secondary | ICD-10-CM

## 2020-06-30 DIAGNOSIS — M48061 Spinal stenosis, lumbar region without neurogenic claudication: Secondary | ICD-10-CM | POA: Diagnosis not present

## 2020-07-05 DIAGNOSIS — M7062 Trochanteric bursitis, left hip: Secondary | ICD-10-CM | POA: Diagnosis not present

## 2020-07-05 DIAGNOSIS — M47817 Spondylosis without myelopathy or radiculopathy, lumbosacral region: Secondary | ICD-10-CM | POA: Diagnosis not present

## 2020-07-05 DIAGNOSIS — G894 Chronic pain syndrome: Secondary | ICD-10-CM | POA: Diagnosis not present

## 2020-07-05 DIAGNOSIS — M5136 Other intervertebral disc degeneration, lumbar region: Secondary | ICD-10-CM | POA: Diagnosis not present

## 2020-07-19 DIAGNOSIS — M47817 Spondylosis without myelopathy or radiculopathy, lumbosacral region: Secondary | ICD-10-CM | POA: Diagnosis not present

## 2020-07-31 ENCOUNTER — Other Ambulatory Visit: Payer: Self-pay | Admitting: Interventional Cardiology

## 2020-07-31 DIAGNOSIS — R072 Precordial pain: Secondary | ICD-10-CM

## 2020-08-02 DIAGNOSIS — M47817 Spondylosis without myelopathy or radiculopathy, lumbosacral region: Secondary | ICD-10-CM | POA: Diagnosis not present

## 2020-08-07 DIAGNOSIS — Z23 Encounter for immunization: Secondary | ICD-10-CM | POA: Diagnosis not present

## 2020-08-08 ENCOUNTER — Encounter: Payer: Self-pay | Admitting: Interventional Cardiology

## 2020-08-08 ENCOUNTER — Other Ambulatory Visit: Payer: Self-pay

## 2020-08-08 ENCOUNTER — Ambulatory Visit (INDEPENDENT_AMBULATORY_CARE_PROVIDER_SITE_OTHER): Payer: Medicare Other | Admitting: Interventional Cardiology

## 2020-08-08 VITALS — BP 114/70 | HR 80 | Ht 70.0 in | Wt 243.0 lb

## 2020-08-08 DIAGNOSIS — I10 Essential (primary) hypertension: Secondary | ICD-10-CM

## 2020-08-08 DIAGNOSIS — I25118 Atherosclerotic heart disease of native coronary artery with other forms of angina pectoris: Secondary | ICD-10-CM

## 2020-08-08 DIAGNOSIS — E782 Mixed hyperlipidemia: Secondary | ICD-10-CM | POA: Diagnosis not present

## 2020-08-08 NOTE — Progress Notes (Signed)
Cardiology Office Note   Date:  08/08/2020   ID:  Erik [name], DOB 05-09-1945, MRN [id number]  PCP:  Maurice Small, MD    No chief complaint on file.  CAD  Wt Readings from Last 3 Encounters:  08/08/20 243 lb (110.2 kg)  01/13/20 248 lb 9.6 oz (112.8 kg)  04/20/19 257 lb (116.6 kg)       History of Present Illness: Erik [name] is a 75 y.o. male  who had progressive anginal symptoms. He underwent cardiac catheterization in April 2017 which revealed a focal lesion in the circumflex. There was a chronic total occlusion of the RCA with left to right collaterals. Due to the lack of LAD disease noted, he was not referred for bypass and the plan was for medical therapy. He returns today for follow-up.  Of note, there is difficulty completing the heart cath from his right radial approach.  Cath in 2017, it was noted at the time of cath that his RCA is not a great candidate for percutaneous intervention of the CTO because there are multiple branch vessels which potentially would be lost, along this long chronic total occlusion.  Isosorbide was increased in April 2017. After this, he was better in terms of chest pain.    I recommended against testosterone supplement in the past.  He has lost 40 lbs between June 2017 until 10/18.  He has had cataract surgery.  Noted in 09/2018: "Rare CP and NTG use. Not sure if it is cardiac but he takes one just in case. He is now able to walk in stores rather than using a motorized chair." He has chronic DOE, particularly with stairs. He continues to walk the dog, no problems on flat ground.  Has some DOE with walking the dog.   He has had back and hip pain and received injections at a pain clinic.  Denies : Chest pain. Dizziness. Leg edema.  Orthopnea. Palpitations. Paroxysmal nocturnal dyspnea. Shortness of breath. Syncope.   Rare NTG use.  He goes for weeks at a time without needing SL NTG.   Past Medical History:   Diagnosis Date  . History of echocardiogram    Echo 4/17 - mild concentric LVH, EF 55-60%, no RWMA, Gr 1 DD, mild AI, mild MR, mild LAE  . Hypertension   . Kidney stone   . Obesity   . Tinea corporis     Past Surgical History:  Procedure Laterality Date  . CARDIAC CATHETERIZATION N/A 07/13/2015   Procedure: Left Heart Cath and Coronary Angiography;  Surgeon: Jettie Booze, MD;  Location: [facility];  Service: Cardiovascular;  Laterality: N/A;  . TOTAL KNEE ARTHROPLASTY Bilateral      Current Outpatient Medications  Medication Sig Dispense Refill  . aspirin 81 MG tablet Take 81 mg by mouth daily.    Marland Kitchen atorvastatin (LIPITOR) 10 MG tablet TAKE 1 TABLET BY MOUTH EVERY DAY 90 tablet 2  . B Complex-C (SUPER B COMPLEX PO) Take 1 tablet by mouth daily.    . Calcium Citrate-Vitamin D (CALCIUM + D PO) Take 1 capsule by mouth as directed. Takes 600 mg as directed    . calcium-vitamin D (OSCAL WITH D) 500-200 MG-UNIT per tablet Take 1 tablet by mouth daily.    . Cholecalciferol (VITAMIN D3) 5000 units CAPS Take 1 capsule by mouth as directed.    . clonazePAM (KLONOPIN) 1 MG tablet Take 1 mg by mouth at bedtime as needed (for sleep).     Marland Kitchen  CORAL CALCIUM PO Take 1 capsule by mouth daily.    . ferrous sulfate 324 MG TBEC Take 324 mg by mouth.    . fexofenadine (ALLEGRA) 180 MG tablet Take 180 mg by mouth as directed.    Marland Kitchen FISH OIL-KRILL OIL PO Take 1 tablet by mouth as directed. 1250 mg by mouth daily    . Flaxseed, Linseed, (FLAXSEED OIL) 1000 MG CAPS Take 1 capsule by mouth as directed.    . folic acid (FOLVITE) 812 MCG tablet Take 400 mcg by mouth daily.    . furosemide (LASIX) 40 MG tablet Take 40 mg by mouth as needed for fluid or edema.    Marland Kitchen HYDROcodone-acetaminophen (NORCO) 10-325 MG tablet 1 tablet as needed    . isosorbide mononitrate (IMDUR) 60 MG 24 hr tablet TAKE 1 AND 1/2 TABLETS BY MOUTH ONCE DAILY 135 tablet 1  . meloxicam (MOBIC) 15 MG tablet Take 15 mg by mouth  daily.  0  . metoprolol tartrate (LOPRESSOR) 25 MG tablet TAKE 1 TABLET BY MOUTH TWICE A DAY 180 tablet 2  . Multiple Vitamin (MULITIVITAMIN WITH MINERALS) TABS Take 1 tablet by mouth daily. MEN'S PRIME    . nitroGLYCERIN (NITROSTAT) 0.4 MG SL tablet DISSOLVE 1 TABLET UNDER TONGUE EVERY 5 MINUTES AS NEEDED FOR CHEST PAIN 25 tablet 7  . omeprazole (PRILOSEC) 40 MG capsule Take 40 mg by mouth daily.    . ramipril (ALTACE) 5 MG capsule TAKE 1 CAPSULE BY MOUTH EVERY DAY 90 capsule 2  . ranolazine (RANEXA) 500 MG 12 hr tablet TAKE 1 TABLET BY MOUTH TWICE A DAY 180 tablet 3  . Saw Palmetto 450 MG CAPS Take 1 capsule by mouth daily.    . Selenium 200 MCG CAPS Take 1 capsule by mouth as directed.    Marland Kitchen Ubiquinol 100 MG CAPS Take 1 tablet by mouth as directed.    . vitamin E 400 UNIT capsule Take 400 Units by mouth daily.     No current facility-administered medications for this visit.    Allergies:   Patient has no known allergies.    Social History:  The patient  reports that he has quit smoking. He has never used smokeless tobacco.   Family History:  The patient's family history includes Heart disease in his mother; Prostate cancer in his father.    ROS:  Please see the history of present illness.   Otherwise, review of systems are positive for back pain.   All other systems are reviewed and negative.    PHYSICAL EXAM: VS:  BP 114/70   Pulse 80   Ht 5\' 10"  (1.778 m)   Wt 243 lb (110.2 kg)   SpO2 93%   BMI 34.87 kg/m  , BMI Body mass index is 34.87 kg/m. GEN: Well nourished, well developed, in no acute distress  HEENT: normal  Neck: no JVD, carotid bruits, or masses Cardiac: RRR; no murmurs, rubs, or gallops,no edema  Respiratory:  clear to auscultation bilaterally, normal work of breathing GI: soft, nontender, nondistended, + BS MS: no deformity or atrophy  Skin: warm and dry, no rash Neuro:  Strength and sensation are intact Psych: euthymic mood, full affect   EKG:   The ekg  ordered today demonstrates NSR, LAD, no ST changes   Recent Labs: 01/13/2020: BUN 19; Creatinine, Ser 1.06; Hemoglobin 15.5; NT-Pro BNP 230; Platelets 241; Potassium 4.9; Sodium 140; TSH 1.290   Lipid Panel    Component Value Date/Time   CHOL 174 04/22/2019  1045   TRIG 70 04/22/2019 1045   HDL 87 04/22/2019 1045   CHOLHDL 2.0 04/22/2019 1045   CHOLHDL 3 07/05/2009 0957   VLDL 19.6 07/05/2009 0957   LDLCALC 74 04/22/2019 1045     Other studies Reviewed: Additional studies/ records that were reviewed today with results demonstrating: LDL 80 in 2019..   ASSESSMENT AND PLAN:  1. CAD: angina well controlled.  Still trying to walk on a regular basis.  If there is a change, he will let us know.  For now, his symptoms are well controlled. 2. HTN: The current medical regimen is effective;  continue present plan and medications. 3. Hyperlipidemia: Continue atorvastatin. 4. Obesity: High-fiber diet with as much activity as he can tolerate.   Current medicines are reviewed at length with the patient today.  The patient concerns regarding his medicines were addressed.  The following changes have been made:  No change  Labs/ tests ordered today include:  No orders of the defined types were placed in this encounter.   Recommend 150 minutes/week of aerobic exercise Low fat, low carb, high fiber diet recommended  Disposition:   FU in 1 year   Signed, Larae Grooms, MD  08/08/2020 2:09 PM    [facility] [place], [place], [place]  [address] Phone: [phone]; Fax: [phone]

## 2020-08-08 NOTE — Patient Instructions (Signed)
Medication Instructions:  Your physician recommends that you continue on your current medications as directed. Please refer to the Current Medication list given to you today.  *If you need a refill on your cardiac medications before your next appointment, please call your pharmacy*   Lab Work: none If you have labs (blood work) drawn today and your tests are completely normal, you will receive your results only by: MyChart Message (if you have MyChart) OR A paper copy in the mail If you have any lab test that is abnormal or we need to change your treatment, we will call you to review the results.   Testing/Procedures: none   Follow-Up: At [facility], you and your health needs are our priority.  As part of our continuing mission to provide you with exceptional heart care, we have created designated Provider Care Teams.  These Care Teams include your primary Cardiologist (physician) and Advanced Practice Providers (APPs -  Physician Assistants and Nurse Practitioners) who all work together to provide you with the care you need, when you need it.  We recommend signing up for the patient portal called "MyChart".  Sign up information is provided on this After Visit Summary.  MyChart is used to connect with patients for Virtual Visits (Telemedicine).  Patients are able to view lab/test results, encounter notes, upcoming appointments, etc.  Non-urgent messages can be sent to your provider as well.   To learn more about what you can do with MyChart, go to [email or web address].    Your next appointment:   6 month(s)  The format for your next appointment:   In Person  Provider:   You may see Jayadeep Varanasi, MD or one of the following Advanced Practice Providers on your designated Care Team:   Dayna Dunn, PA-C Michele Lenze, PA-C   Other Instructions   

## 2020-08-16 DIAGNOSIS — M47817 Spondylosis without myelopathy or radiculopathy, lumbosacral region: Secondary | ICD-10-CM | POA: Diagnosis not present

## 2020-08-30 DIAGNOSIS — M47817 Spondylosis without myelopathy or radiculopathy, lumbosacral region: Secondary | ICD-10-CM | POA: Diagnosis not present

## 2020-10-11 DIAGNOSIS — G894 Chronic pain syndrome: Secondary | ICD-10-CM | POA: Diagnosis not present

## 2020-10-11 DIAGNOSIS — M47817 Spondylosis without myelopathy or radiculopathy, lumbosacral region: Secondary | ICD-10-CM | POA: Diagnosis not present

## 2020-10-11 DIAGNOSIS — M5136 Other intervertebral disc degeneration, lumbar region: Secondary | ICD-10-CM | POA: Diagnosis not present

## 2020-10-11 DIAGNOSIS — M4727 Other spondylosis with radiculopathy, lumbosacral region: Secondary | ICD-10-CM | POA: Diagnosis not present

## 2020-10-19 DIAGNOSIS — M6281 Muscle weakness (generalized): Secondary | ICD-10-CM | POA: Diagnosis not present

## 2020-10-19 DIAGNOSIS — M4727 Other spondylosis with radiculopathy, lumbosacral region: Secondary | ICD-10-CM | POA: Diagnosis not present

## 2020-10-19 DIAGNOSIS — M5137 Other intervertebral disc degeneration, lumbosacral region: Secondary | ICD-10-CM | POA: Diagnosis not present

## 2020-10-19 DIAGNOSIS — M47817 Spondylosis without myelopathy or radiculopathy, lumbosacral region: Secondary | ICD-10-CM | POA: Diagnosis not present

## 2020-10-19 DIAGNOSIS — M545 Low back pain, unspecified: Secondary | ICD-10-CM | POA: Diagnosis not present

## 2020-10-25 DIAGNOSIS — M5137 Other intervertebral disc degeneration, lumbosacral region: Secondary | ICD-10-CM | POA: Diagnosis not present

## 2020-10-25 DIAGNOSIS — M545 Low back pain, unspecified: Secondary | ICD-10-CM | POA: Diagnosis not present

## 2020-10-25 DIAGNOSIS — M47817 Spondylosis without myelopathy or radiculopathy, lumbosacral region: Secondary | ICD-10-CM | POA: Diagnosis not present

## 2020-10-25 DIAGNOSIS — M6281 Muscle weakness (generalized): Secondary | ICD-10-CM | POA: Diagnosis not present

## 2020-10-25 DIAGNOSIS — M4727 Other spondylosis with radiculopathy, lumbosacral region: Secondary | ICD-10-CM | POA: Diagnosis not present

## 2020-10-30 DIAGNOSIS — M5137 Other intervertebral disc degeneration, lumbosacral region: Secondary | ICD-10-CM | POA: Diagnosis not present

## 2020-10-30 DIAGNOSIS — M545 Low back pain, unspecified: Secondary | ICD-10-CM | POA: Diagnosis not present

## 2020-10-30 DIAGNOSIS — M4727 Other spondylosis with radiculopathy, lumbosacral region: Secondary | ICD-10-CM | POA: Diagnosis not present

## 2020-10-30 DIAGNOSIS — M6281 Muscle weakness (generalized): Secondary | ICD-10-CM | POA: Diagnosis not present

## 2020-10-30 DIAGNOSIS — M47817 Spondylosis without myelopathy or radiculopathy, lumbosacral region: Secondary | ICD-10-CM | POA: Diagnosis not present

## 2020-11-01 DIAGNOSIS — M4727 Other spondylosis with radiculopathy, lumbosacral region: Secondary | ICD-10-CM | POA: Diagnosis not present

## 2020-11-01 DIAGNOSIS — M545 Low back pain, unspecified: Secondary | ICD-10-CM | POA: Diagnosis not present

## 2020-11-01 DIAGNOSIS — M47817 Spondylosis without myelopathy or radiculopathy, lumbosacral region: Secondary | ICD-10-CM | POA: Diagnosis not present

## 2020-11-01 DIAGNOSIS — M5137 Other intervertebral disc degeneration, lumbosacral region: Secondary | ICD-10-CM | POA: Diagnosis not present

## 2020-11-01 DIAGNOSIS — M6281 Muscle weakness (generalized): Secondary | ICD-10-CM | POA: Diagnosis not present

## 2020-11-06 DIAGNOSIS — Z79891 Long term (current) use of opiate analgesic: Secondary | ICD-10-CM | POA: Diagnosis not present

## 2020-11-06 DIAGNOSIS — M5136 Other intervertebral disc degeneration, lumbar region: Secondary | ICD-10-CM | POA: Diagnosis not present

## 2020-11-06 DIAGNOSIS — Z79899 Other long term (current) drug therapy: Secondary | ICD-10-CM | POA: Diagnosis not present

## 2020-11-06 DIAGNOSIS — M47817 Spondylosis without myelopathy or radiculopathy, lumbosacral region: Secondary | ICD-10-CM | POA: Diagnosis not present

## 2020-11-06 DIAGNOSIS — M4727 Other spondylosis with radiculopathy, lumbosacral region: Secondary | ICD-10-CM | POA: Diagnosis not present

## 2020-11-06 DIAGNOSIS — G894 Chronic pain syndrome: Secondary | ICD-10-CM | POA: Diagnosis not present

## 2020-11-20 DIAGNOSIS — M5136 Other intervertebral disc degeneration, lumbar region: Secondary | ICD-10-CM | POA: Diagnosis not present

## 2020-12-11 DIAGNOSIS — M5136 Other intervertebral disc degeneration, lumbar region: Secondary | ICD-10-CM | POA: Diagnosis not present

## 2020-12-11 DIAGNOSIS — M47817 Spondylosis without myelopathy or radiculopathy, lumbosacral region: Secondary | ICD-10-CM | POA: Diagnosis not present

## 2020-12-11 DIAGNOSIS — M4727 Other spondylosis with radiculopathy, lumbosacral region: Secondary | ICD-10-CM | POA: Diagnosis not present

## 2020-12-11 DIAGNOSIS — G894 Chronic pain syndrome: Secondary | ICD-10-CM | POA: Diagnosis not present

## 2021-01-02 DIAGNOSIS — R7303 Prediabetes: Secondary | ICD-10-CM | POA: Diagnosis not present

## 2021-01-02 DIAGNOSIS — G894 Chronic pain syndrome: Secondary | ICD-10-CM | POA: Diagnosis not present

## 2021-01-02 DIAGNOSIS — F321 Major depressive disorder, single episode, moderate: Secondary | ICD-10-CM | POA: Diagnosis not present

## 2021-01-02 DIAGNOSIS — I25118 Atherosclerotic heart disease of native coronary artery with other forms of angina pectoris: Secondary | ICD-10-CM | POA: Diagnosis not present

## 2021-01-02 DIAGNOSIS — G479 Sleep disorder, unspecified: Secondary | ICD-10-CM | POA: Diagnosis not present

## 2021-01-02 DIAGNOSIS — D7589 Other specified diseases of blood and blood-forming organs: Secondary | ICD-10-CM | POA: Diagnosis not present

## 2021-01-08 DIAGNOSIS — M4727 Other spondylosis with radiculopathy, lumbosacral region: Secondary | ICD-10-CM | POA: Diagnosis not present

## 2021-01-08 DIAGNOSIS — G894 Chronic pain syndrome: Secondary | ICD-10-CM | POA: Diagnosis not present

## 2021-01-08 DIAGNOSIS — M5136 Other intervertebral disc degeneration, lumbar region: Secondary | ICD-10-CM | POA: Diagnosis not present

## 2021-01-08 DIAGNOSIS — M47817 Spondylosis without myelopathy or radiculopathy, lumbosacral region: Secondary | ICD-10-CM | POA: Diagnosis not present

## 2021-01-11 ENCOUNTER — Other Ambulatory Visit: Payer: Self-pay | Admitting: Interventional Cardiology

## 2021-01-15 DIAGNOSIS — Z23 Encounter for immunization: Secondary | ICD-10-CM | POA: Diagnosis not present

## 2021-01-23 ENCOUNTER — Other Ambulatory Visit: Payer: Self-pay | Admitting: Interventional Cardiology

## 2021-01-23 DIAGNOSIS — R072 Precordial pain: Secondary | ICD-10-CM

## 2021-01-24 ENCOUNTER — Other Ambulatory Visit: Payer: Self-pay | Admitting: Interventional Cardiology

## 2021-02-04 NOTE — Progress Notes (Signed)
Cardiology Office Note   Date:  02/05/2021   ID:  Erik [name], DOB 09-Jun-1945, MRN [id number]  PCP:  Jonathon Jordan, MD    No chief complaint on file.  CAD  Wt Readings from Last 3 Encounters:  02/05/21 240 lb (108.9 kg)  08/08/20 243 lb (110.2 kg)  01/13/20 248 lb 9.6 oz (112.8 kg)       History of Present Illness: Erik [name] is a 75 y.o. male   who had progressive anginal symptoms. He underwent cardiac catheterization in April 2017 which revealed a focal lesion in the circumflex. There was a chronic total occlusion of the RCA with left to right collaterals. Due to the lack of LAD disease noted, he was not referred for bypass and the plan was for medical therapy. He returns today for follow-up.   Of note, there is difficulty completing the heart cath from his right radial approach.    Cath in 2017, it was noted at the time of cath that his RCA is not a great candidate for percutaneous intervention of the CTO because there are multiple branch vessels which potentially would be lost, along this long chronic total occlusion.   Isosorbide was increased in April 2017. After this, he was better in terms of chest pain.      I recommended against testosterone supplement in the past.     He has lost 40 lbs between June 2017 until 10/18.    He has had cataract surgery.   Noted in 09/2018: "Rare CP and NTG use.  Not sure if it is cardiac but he takes one just in case.  He is now able to walk in stores rather than using a motorized chair." He has chronic DOE, particularly with stairs. He continues to walk the dog, no problems on flat ground.  Has some DOE with walking the dog.    He has had back and hip pain and received injections at a pain clinic.  He is eating less and has lost 15 lbs in 2022. Sees Dr. Vira Blanco for pain management. He did PT for chronic pain.  He walks with his dog.  He does some exercises along with a TV exercise program.   Denies : exertional Chest pain.  Dizziness. Leg edema. Orthopnea. Palpitations. Paroxysmal nocturnal dyspnea. Shortness of breath. Syncope.    Occasional NTG usage.   69 y/o dog was ill.  Getting better now.     Past Medical History:  Diagnosis Date   History of echocardiogram    Echo 4/17 - mild concentric LVH, EF 55-60%, no RWMA, Gr 1 DD, mild AI, mild MR, mild LAE   Hypertension    Kidney stone    Obesity    Tinea corporis     Past Surgical History:  Procedure Laterality Date   CARDIAC CATHETERIZATION N/A 07/13/2015   Procedure: Left Heart Cath and Coronary Angiography;  Surgeon: Jettie Booze, MD;  Location: [facility];  Service: Cardiovascular;  Laterality: N/A;   TOTAL KNEE ARTHROPLASTY Bilateral      Current Outpatient Medications  Medication Sig Dispense Refill   aspirin 81 MG tablet Take 81 mg by mouth daily.     atorvastatin (LIPITOR) 10 MG tablet TAKE 1 TABLET BY MOUTH EVERY DAY 90 tablet 2   B Complex-C (SUPER B COMPLEX PO) Take 1 tablet by mouth daily.     calcium-vitamin D (OSCAL WITH D) 500-200 MG-UNIT per tablet Take 1 tablet by mouth daily.  Cholecalciferol (VITAMIN D3) 5000 units CAPS Take 1 capsule by mouth as directed.     CORAL CALCIUM PO Take 1 capsule by mouth daily.     ferrous sulfate 324 MG TBEC Take 324 mg by mouth.     fexofenadine (ALLEGRA) 180 MG tablet Take 180 mg by mouth as directed.     FISH OIL-KRILL OIL PO Take 1 tablet by mouth as directed. 1250 mg by mouth daily     Flaxseed, Linseed, (FLAXSEED OIL) 1000 MG CAPS Take 1 capsule by mouth as directed.     folic acid (FOLVITE) 767 MCG tablet Take 400 mcg by mouth daily.     furosemide (LASIX) 40 MG tablet Take 40 mg by mouth as needed for fluid or edema.     HYDROcodone-acetaminophen (NORCO) 10-325 MG tablet 1 tablet as needed     isosorbide mononitrate (IMDUR) 60 MG 24 hr tablet TAKE 1 AND 1/2 TABLETS BY MOUTH ONCE DAILY 135 tablet 2   meloxicam (MOBIC) 15 MG tablet Take 15 mg by mouth daily.  0    metoprolol tartrate (LOPRESSOR) 25 MG tablet TAKE 1 TABLET BY MOUTH TWICE A DAY 180 tablet 2   mirtazapine (REMERON) 30 MG tablet Take 30 mg by mouth at bedtime.     Multiple Vitamin (MULITIVITAMIN WITH MINERALS) TABS Take 1 tablet by mouth daily. MEN'S PRIME     nitroGLYCERIN (NITROSTAT) 0.4 MG SL tablet DISSOLVE 1 TABLET UNDER TONGUE EVERY 5 MINUTES AS NEEDED FOR CHEST PAIN 25 tablet 7   omeprazole (PRILOSEC) 40 MG capsule Take 40 mg by mouth daily.     ramipril (ALTACE) 5 MG capsule TAKE 1 CAPSULE BY MOUTH EVERY DAY 90 capsule 2   ranolazine (RANEXA) 500 MG 12 hr tablet TAKE 1 TABLET BY MOUTH TWICE A DAY 180 tablet 3   Saw Palmetto 450 MG CAPS Take 1 capsule by mouth daily.     Selenium 200 MCG CAPS Take 1 capsule by mouth as directed.     Ubiquinol 100 MG CAPS Take 1 tablet by mouth as directed.     vitamin E 400 UNIT capsule Take 400 Units by mouth daily.     clonazePAM (KLONOPIN) 1 MG tablet Take 1 mg by mouth at bedtime as needed (for sleep).  (Patient not taking: Reported on 02/05/2021)     No current facility-administered medications for this visit.    Allergies:   Patient has no known allergies.    Social History:  The patient  reports that he has quit smoking. He has never used smokeless tobacco.   Family History:  The patient's family history includes Heart disease in his mother; Prostate cancer in his father.    ROS:  Please see the history of present illness.   Otherwise, review of systems are positive for intentional weight.   All other systems are reviewed and negative.    PHYSICAL EXAM: VS:  BP 120/68   Pulse 82   Ht 5\' 8"  (1.727 m)   Wt 240 lb (108.9 kg)   SpO2 96%   BMI 36.49 kg/m  , BMI Body mass index is 36.49 kg/m. GEN: Well nourished, well developed, in no acute distress HEENT: normal Neck: no JVD, carotid bruits, or masses Cardiac: RRR; no murmurs, rubs, or gallops,no edema  Respiratory:  clear to auscultation bilaterally, normal work of breathing GI:  soft, nontender, nondistended, + BS MS: no deformity or atrophy Skin: warm and dry, no rash Neuro:  Strength and sensation are intact Psych:  euthymic mood, full affect   EKG:   The ekg ordered 07/2020 demonstrates NSR, inf Q waves   Recent Labs: No results found for requested labs within last 8760 hours.   Lipid Panel    Component Value Date/Time   CHOL 174 04/22/2019 1045   TRIG 70 04/22/2019 1045   HDL 87 04/22/2019 1045   CHOLHDL 2.0 04/22/2019 1045   CHOLHDL 3 07/05/2009 0957   VLDL 19.6 07/05/2009 0957   LDLCALC 74 04/22/2019 1045     Other studies Reviewed: Additional studies/ records that were reviewed today with results demonstrating: labs reviewed.   ASSESSMENT AND PLAN:  CAD: Angina controlled on medical therapy.  Continue aggressive secondary prevention.  Trying to walk more to improve stamina.  HTN: The current medical regimen is effective;  continue present plan and medications. Hyperlipidemia: LDL 80. The current medical regimen is effective;  continue present plan and medications. Morbid Obesity: weight decreasing. Continue with portion control.   Current medicines are reviewed at length with the patient today.  The patient concerns regarding his medicines were addressed.  The following changes have been made:  No change  Labs/ tests ordered today include:  No orders of the defined types were placed in this encounter.   Recommend 150 minutes/week of aerobic exercise Low fat, low carb, high fiber diet recommended  Disposition:   FU in 6 months   Signed, Larae Grooms, MD  02/05/2021 2:07 PM    [facility] [place], [place],   [address] Phone: [phone]; Fax: [phone]

## 2021-02-05 ENCOUNTER — Encounter: Payer: Self-pay | Admitting: Interventional Cardiology

## 2021-02-05 ENCOUNTER — Ambulatory Visit (INDEPENDENT_AMBULATORY_CARE_PROVIDER_SITE_OTHER): Payer: Medicare Other | Admitting: Interventional Cardiology

## 2021-02-05 ENCOUNTER — Other Ambulatory Visit: Payer: Self-pay

## 2021-02-05 VITALS — BP 120/68 | HR 82 | Ht 68.0 in | Wt 240.0 lb

## 2021-02-05 DIAGNOSIS — I10 Essential (primary) hypertension: Secondary | ICD-10-CM

## 2021-02-05 DIAGNOSIS — I25118 Atherosclerotic heart disease of native coronary artery with other forms of angina pectoris: Secondary | ICD-10-CM | POA: Diagnosis not present

## 2021-02-05 DIAGNOSIS — E782 Mixed hyperlipidemia: Secondary | ICD-10-CM | POA: Diagnosis not present

## 2021-02-05 NOTE — Patient Instructions (Signed)
Medication Instructions:  °Your physician recommends that you continue on your current medications as directed. Please refer to the Current Medication list given to you today. ° °*If you need a refill on your cardiac medications before your next appointment, please call your pharmacy* ° ° °Lab Work: °none °If you have labs (blood work) drawn today and your tests are completely normal, you will receive your results only by: °MyChart Message (if you have MyChart) OR °A paper copy in the mail °If you have any lab test that is abnormal or we need to change your treatment, we will call you to review the results. ° ° °Testing/Procedures: °none ° ° °Follow-Up: °At [facility], you and your health needs are our priority.  As part of our continuing mission to provide you with exceptional heart care, we have created designated Provider Care Teams.  These Care Teams include your primary Cardiologist (physician) and Advanced Practice Providers (APPs -  Physician Assistants and Nurse Practitioners) who all work together to provide you with the care you need, when you need it. ° °We recommend signing up for the patient portal called "MyChart".  Sign up information is provided on this After Visit Summary.  MyChart is used to connect with patients for Virtual Visits (Telemedicine).  Patients are able to view lab/test results, encounter notes, upcoming appointments, etc.  Non-urgent messages can be sent to your provider as well.   °To learn more about what you can do with MyChart, go to [email or web address].   ° °Your next appointment:   °6 month(s) ° °The format for your next appointment:   °In Person ° °Provider:   °Jayadeep Varanasi, MD   ° ° °Other Instructions ° ° °

## 2021-02-08 DIAGNOSIS — G894 Chronic pain syndrome: Secondary | ICD-10-CM | POA: Diagnosis not present

## 2021-02-08 DIAGNOSIS — M47817 Spondylosis without myelopathy or radiculopathy, lumbosacral region: Secondary | ICD-10-CM | POA: Diagnosis not present

## 2021-02-08 DIAGNOSIS — M545 Low back pain, unspecified: Secondary | ICD-10-CM | POA: Diagnosis not present

## 2021-02-08 DIAGNOSIS — M79604 Pain in right leg: Secondary | ICD-10-CM | POA: Diagnosis not present

## 2021-02-08 DIAGNOSIS — M4727 Other spondylosis with radiculopathy, lumbosacral region: Secondary | ICD-10-CM | POA: Diagnosis not present

## 2021-02-08 DIAGNOSIS — Z79899 Other long term (current) drug therapy: Secondary | ICD-10-CM | POA: Diagnosis not present

## 2021-02-08 DIAGNOSIS — Z79891 Long term (current) use of opiate analgesic: Secondary | ICD-10-CM | POA: Diagnosis not present

## 2021-02-08 DIAGNOSIS — M5136 Other intervertebral disc degeneration, lumbar region: Secondary | ICD-10-CM | POA: Diagnosis not present

## 2021-03-12 DIAGNOSIS — M5136 Other intervertebral disc degeneration, lumbar region: Secondary | ICD-10-CM | POA: Diagnosis not present

## 2021-03-12 DIAGNOSIS — M47817 Spondylosis without myelopathy or radiculopathy, lumbosacral region: Secondary | ICD-10-CM | POA: Diagnosis not present

## 2021-03-12 DIAGNOSIS — M4727 Other spondylosis with radiculopathy, lumbosacral region: Secondary | ICD-10-CM | POA: Diagnosis not present

## 2021-03-12 DIAGNOSIS — M79604 Pain in right leg: Secondary | ICD-10-CM | POA: Diagnosis not present

## 2021-03-20 DIAGNOSIS — L820 Inflamed seborrheic keratosis: Secondary | ICD-10-CM | POA: Diagnosis not present

## 2021-03-20 DIAGNOSIS — L578 Other skin changes due to chronic exposure to nonionizing radiation: Secondary | ICD-10-CM | POA: Diagnosis not present

## 2021-03-20 DIAGNOSIS — L821 Other seborrheic keratosis: Secondary | ICD-10-CM | POA: Diagnosis not present

## 2021-03-20 DIAGNOSIS — D1801 Hemangioma of skin and subcutaneous tissue: Secondary | ICD-10-CM | POA: Diagnosis not present

## 2021-03-20 DIAGNOSIS — D692 Other nonthrombocytopenic purpura: Secondary | ICD-10-CM | POA: Diagnosis not present

## 2021-03-28 DIAGNOSIS — R7303 Prediabetes: Secondary | ICD-10-CM | POA: Diagnosis not present

## 2021-03-28 DIAGNOSIS — Z79899 Other long term (current) drug therapy: Secondary | ICD-10-CM | POA: Diagnosis not present

## 2021-03-28 DIAGNOSIS — R768 Other specified abnormal immunological findings in serum: Secondary | ICD-10-CM | POA: Diagnosis not present

## 2021-03-28 DIAGNOSIS — Z125 Encounter for screening for malignant neoplasm of prostate: Secondary | ICD-10-CM | POA: Diagnosis not present

## 2021-03-28 DIAGNOSIS — G479 Sleep disorder, unspecified: Secondary | ICD-10-CM | POA: Diagnosis not present

## 2021-03-28 DIAGNOSIS — G894 Chronic pain syndrome: Secondary | ICD-10-CM | POA: Diagnosis not present

## 2021-03-28 DIAGNOSIS — Z1159 Encounter for screening for other viral diseases: Secondary | ICD-10-CM | POA: Diagnosis not present

## 2021-03-28 DIAGNOSIS — I700 Atherosclerosis of aorta: Secondary | ICD-10-CM | POA: Diagnosis not present

## 2021-03-28 DIAGNOSIS — Z0000 Encounter for general adult medical examination without abnormal findings: Secondary | ICD-10-CM | POA: Diagnosis not present

## 2021-03-28 DIAGNOSIS — I25118 Atherosclerotic heart disease of native coronary artery with other forms of angina pectoris: Secondary | ICD-10-CM | POA: Diagnosis not present

## 2021-03-28 DIAGNOSIS — M5136 Other intervertebral disc degeneration, lumbar region: Secondary | ICD-10-CM | POA: Diagnosis not present

## 2021-03-28 DIAGNOSIS — M109 Gout, unspecified: Secondary | ICD-10-CM | POA: Diagnosis not present

## 2021-03-28 DIAGNOSIS — D7589 Other specified diseases of blood and blood-forming organs: Secondary | ICD-10-CM | POA: Diagnosis not present

## 2021-03-28 DIAGNOSIS — I10 Essential (primary) hypertension: Secondary | ICD-10-CM | POA: Diagnosis not present

## 2021-04-10 DIAGNOSIS — G894 Chronic pain syndrome: Secondary | ICD-10-CM | POA: Diagnosis not present

## 2021-04-10 DIAGNOSIS — M47817 Spondylosis without myelopathy or radiculopathy, lumbosacral region: Secondary | ICD-10-CM | POA: Diagnosis not present

## 2021-04-10 DIAGNOSIS — M5136 Other intervertebral disc degeneration, lumbar region: Secondary | ICD-10-CM | POA: Diagnosis not present

## 2021-04-10 DIAGNOSIS — M4727 Other spondylosis with radiculopathy, lumbosacral region: Secondary | ICD-10-CM | POA: Diagnosis not present

## 2021-04-18 DIAGNOSIS — K2100 Gastro-esophageal reflux disease with esophagitis, without bleeding: Secondary | ICD-10-CM | POA: Diagnosis not present

## 2021-04-30 ENCOUNTER — Other Ambulatory Visit: Payer: Self-pay | Admitting: Interventional Cardiology

## 2021-05-02 DIAGNOSIS — U071 COVID-19: Secondary | ICD-10-CM | POA: Diagnosis not present

## 2021-05-18 DIAGNOSIS — Z79891 Long term (current) use of opiate analgesic: Secondary | ICD-10-CM | POA: Diagnosis not present

## 2021-05-18 DIAGNOSIS — M545 Low back pain, unspecified: Secondary | ICD-10-CM | POA: Diagnosis not present

## 2021-06-14 DIAGNOSIS — M545 Low back pain, unspecified: Secondary | ICD-10-CM | POA: Diagnosis not present

## 2021-06-14 DIAGNOSIS — Z79891 Long term (current) use of opiate analgesic: Secondary | ICD-10-CM | POA: Diagnosis not present

## 2021-07-12 DIAGNOSIS — Z79891 Long term (current) use of opiate analgesic: Secondary | ICD-10-CM | POA: Diagnosis not present

## 2021-07-12 DIAGNOSIS — M545 Low back pain, unspecified: Secondary | ICD-10-CM | POA: Diagnosis not present

## 2021-07-16 ENCOUNTER — Other Ambulatory Visit: Payer: Self-pay | Admitting: Interventional Cardiology

## 2021-08-07 ENCOUNTER — Other Ambulatory Visit: Payer: Self-pay | Admitting: Interventional Cardiology

## 2021-08-08 DIAGNOSIS — Z79891 Long term (current) use of opiate analgesic: Secondary | ICD-10-CM | POA: Diagnosis not present

## 2021-08-08 DIAGNOSIS — M545 Low back pain, unspecified: Secondary | ICD-10-CM | POA: Diagnosis not present

## 2021-08-08 DIAGNOSIS — M47817 Spondylosis without myelopathy or radiculopathy, lumbosacral region: Secondary | ICD-10-CM | POA: Diagnosis not present

## 2021-09-05 DIAGNOSIS — M545 Low back pain, unspecified: Secondary | ICD-10-CM | POA: Diagnosis not present

## 2021-09-05 DIAGNOSIS — Z79891 Long term (current) use of opiate analgesic: Secondary | ICD-10-CM | POA: Diagnosis not present

## 2021-09-05 DIAGNOSIS — M47816 Spondylosis without myelopathy or radiculopathy, lumbar region: Secondary | ICD-10-CM | POA: Diagnosis not present

## 2021-09-26 DIAGNOSIS — M47816 Spondylosis without myelopathy or radiculopathy, lumbar region: Secondary | ICD-10-CM | POA: Diagnosis not present

## 2021-10-02 DIAGNOSIS — M4722 Other spondylosis with radiculopathy, cervical region: Secondary | ICD-10-CM | POA: Diagnosis not present

## 2021-10-02 DIAGNOSIS — M4802 Spinal stenosis, cervical region: Secondary | ICD-10-CM | POA: Diagnosis not present

## 2021-10-02 DIAGNOSIS — M48061 Spinal stenosis, lumbar region without neurogenic claudication: Secondary | ICD-10-CM | POA: Diagnosis not present

## 2021-10-02 DIAGNOSIS — M47816 Spondylosis without myelopathy or radiculopathy, lumbar region: Secondary | ICD-10-CM | POA: Diagnosis not present

## 2021-10-03 ENCOUNTER — Other Ambulatory Visit: Payer: Self-pay | Admitting: Interventional Cardiology

## 2021-10-18 ENCOUNTER — Other Ambulatory Visit: Payer: Self-pay | Admitting: Interventional Cardiology

## 2021-10-23 ENCOUNTER — Other Ambulatory Visit: Payer: Self-pay | Admitting: Interventional Cardiology

## 2021-10-23 DIAGNOSIS — R072 Precordial pain: Secondary | ICD-10-CM

## 2021-10-31 DIAGNOSIS — M5416 Radiculopathy, lumbar region: Secondary | ICD-10-CM | POA: Diagnosis not present

## 2021-10-31 DIAGNOSIS — M47816 Spondylosis without myelopathy or radiculopathy, lumbar region: Secondary | ICD-10-CM | POA: Diagnosis not present

## 2021-11-02 DIAGNOSIS — Z79891 Long term (current) use of opiate analgesic: Secondary | ICD-10-CM | POA: Diagnosis not present

## 2021-11-02 DIAGNOSIS — M545 Low back pain, unspecified: Secondary | ICD-10-CM | POA: Diagnosis not present

## 2021-11-02 DIAGNOSIS — M47817 Spondylosis without myelopathy or radiculopathy, lumbosacral region: Secondary | ICD-10-CM | POA: Diagnosis not present

## 2021-11-05 ENCOUNTER — Other Ambulatory Visit: Payer: Self-pay | Admitting: Interventional Cardiology

## 2021-11-14 DIAGNOSIS — M47816 Spondylosis without myelopathy or radiculopathy, lumbar region: Secondary | ICD-10-CM | POA: Diagnosis not present

## 2021-11-28 DIAGNOSIS — M47816 Spondylosis without myelopathy or radiculopathy, lumbar region: Secondary | ICD-10-CM | POA: Diagnosis not present

## 2021-11-30 DIAGNOSIS — Z79891 Long term (current) use of opiate analgesic: Secondary | ICD-10-CM | POA: Diagnosis not present

## 2021-11-30 DIAGNOSIS — M545 Low back pain, unspecified: Secondary | ICD-10-CM | POA: Diagnosis not present

## 2021-11-30 DIAGNOSIS — M47817 Spondylosis without myelopathy or radiculopathy, lumbosacral region: Secondary | ICD-10-CM | POA: Diagnosis not present

## 2021-12-11 NOTE — Progress Notes (Unsigned)
Cardiology Office Note   Date:  12/12/2021   ID:  Erik [name], DOB [birth year], MRN [id number]  PCP:  Jonathon Jordan, MD    No chief complaint on file.  CAD  Wt Readings from Last 3 Encounters:  12/12/21 236 lb 8 oz (107.3 kg)  02/05/21 240 lb (108.9 kg)  08/08/20 243 lb (110.2 kg)       History of Present Illness: Erik [name] is a 76 y.o. male   who had progressive anginal symptoms. He underwent cardiac catheterization in April 2017 which revealed a focal lesion in the circumflex. There was a chronic total occlusion of the RCA with left to right collaterals. Due to the lack of LAD disease noted, he was not referred for bypass and the plan was for medical therapy. He returns today for follow-up.   Of note, there is difficulty completing the heart cath from his right radial approach.    Cath in 2017, it was noted at the time of cath that his RCA is not a great candidate for percutaneous intervention of the CTO because there are multiple branch vessels which potentially would be lost, along this long chronic total occlusion.   Isosorbide was increased in April 2017. After this, he was better in terms of chest pain.      I recommended against testosterone supplement in the past.     He has lost 40 lbs between June 2017 until 10/18.    He has had cataract surgery.   Noted in 09/2018: "Rare CP and NTG use.  Not sure if it is cardiac but he takes one just in case.  He is now able to walk in stores rather than using a motorized chair." He has chronic DOE, particularly with stairs. He continues to walk the dog, no problems on flat ground.  Has some DOE with walking the dog.    He has had back and hip pain and received injections at a pain clinic.   He is eating less and has lost 15 lbs in 2022. Saw Dr. Vira Blanco for pain management, now seeing a different doctor in that practice. He did PT for chronic pain.  He walks with his dog.  He does some exercises along with a TV exercise  program.   Since the last visit, he continues to try to work in the yard.  No recent use of SL NTG.  Has used it in the past with some relief.   He also is limited by arthritis in his lower back.  Overall activity has decreased.      Past Medical History:  Diagnosis Date   History of echocardiogram    Echo 4/17 - mild concentric LVH, EF 55-60%, no RWMA, Gr 1 DD, mild AI, mild MR, mild LAE   Hypertension    Kidney stone    Obesity    Tinea corporis     Past Surgical History:  Procedure Laterality Date   CARDIAC CATHETERIZATION N/A 07/13/2015   Procedure: Left Heart Cath and Coronary Angiography;  Surgeon: Jettie Booze, MD;  Location: [facility];  Service: Cardiovascular;  Laterality: N/A;   TOTAL KNEE ARTHROPLASTY Bilateral      Current Outpatient Medications  Medication Sig Dispense Refill   aspirin 81 MG tablet Take 81 mg by mouth daily.     atorvastatin (LIPITOR) 10 MG tablet TAKE 1 TABLET BY MOUTH EVERY DAY 90 tablet 3   B Complex-C (SUPER B COMPLEX PO) Take 1 tablet by mouth  daily.     Cholecalciferol (VITAMIN D3) 5000 units CAPS Take 1 capsule by mouth as directed.     CORAL CALCIUM PO Take 1 capsule by mouth daily.     ferrous sulfate 324 MG TBEC Take 324 mg by mouth.     FISH OIL-KRILL OIL PO Take 1 tablet by mouth as directed. 1250 mg by mouth daily     Flaxseed, Linseed, (FLAXSEED OIL) 1000 MG CAPS Take 1 capsule by mouth as directed.     folic acid (FOLVITE) 245 MCG tablet Take 400 mcg by mouth daily.     furosemide (LASIX) 40 MG tablet Take 40 mg by mouth as needed for fluid or edema.     HYDROcodone-acetaminophen (NORCO) 10-325 MG tablet 1 tablet as needed     ISOSORBIDE MONONITRATE ER PO Take 60 mg by mouth daily at 6 (six) AM.     meloxicam (MOBIC) 15 MG tablet Take 15 mg by mouth daily.  0   metoprolol tartrate (LOPRESSOR) 25 MG tablet TAKE 1 TABLET BY MOUTH TWICE A DAY 180 tablet 1   mirtazapine (REMERON) 30 MG tablet Take 30 mg by mouth at  bedtime.     Multiple Vitamin (MULITIVITAMIN WITH MINERALS) TABS Take 1 tablet by mouth daily. MEN'S PRIME     nitroGLYCERIN (NITROSTAT) 0.4 MG SL tablet DISSOLVE 1 TABLET UNDER TONGUE EVERY 5 MINUTES AS NEEDED FOR CHEST PAIN 25 tablet 6   omeprazole (PRILOSEC) 40 MG capsule Take 40 mg by mouth daily.     ramipril (ALTACE) 5 MG capsule Take 1 capsule (5 mg total) by mouth daily. 90 capsule 0   ranolazine (RANEXA) 500 MG 12 hr tablet Take 500 mg by mouth daily at 6 (six) AM.     Saw Palmetto 450 MG CAPS Take 1 capsule by mouth daily.     Selenium 200 MCG CAPS Take 1 capsule by mouth as directed.     Ubiquinol 100 MG CAPS Take 1 tablet by mouth as directed.     vitamin E 400 UNIT capsule Take 400 Units by mouth daily.     No current facility-administered medications for this visit.    Allergies:   Patient has no known allergies.    Social History:  The patient  reports that he has quit smoking. He has never used smokeless tobacco.   Family History:  The patient's family history includes Heart disease in his mother; Prostate cancer in his father.    ROS:  Please see the history of present illness.   Otherwise, review of systems are positive for back pain; toe numbness.   All other systems are reviewed and negative.    PHYSICAL EXAM: VS:  BP 110/72   Pulse 70   Ht '5\' 8"'$  (1.727 m)   Wt 236 lb 8 oz (107.3 kg)   SpO2 94%   BMI 35.96 kg/m  , BMI Body mass index is 35.96 kg/m. GEN: Well nourished, well developed, in no acute distress HEENT: normal Neck: no JVD, carotid bruits, or masses Cardiac: RRR; no murmurs, rubs, or gallops,no edema ; 2+ PT pulses bilaterally Respiratory:  clear to auscultation bilaterally, normal work of breathing GI: soft, nontender, nondistended, + BS MS: no deformity or atrophy Skin: warm and dry, no rash Neuro:  Strength and sensation are intact Psych: euthymic mood, full affect   EKG:   The ekg ordered today demonstrates NSR, PACs, PRWP, no change  from prior   Recent Labs: No results found for requested labs  within last 365 days.   Lipid Panel    Component Value Date/Time   CHOL 174 04/22/2019 1045   TRIG 70 04/22/2019 1045   HDL 87 04/22/2019 1045   CHOLHDL 2.0 04/22/2019 1045   CHOLHDL 3 07/05/2009 0957   VLDL 19.6 07/05/2009 0957   LDLCALC 74 04/22/2019 1045     Other studies Reviewed: Additional studies/ records that were reviewed today with results demonstrating: Total cholesterol 164 HDL 73 LDL 77 triglycerides 72 in January 2023.   ASSESSMENT AND PLAN:  CAD: Known CAD.  Symptoms are currently well controlled on medication.  He will let us know if he has a started using nitroglycerin again.  If symptoms worsen, would plan for repeat coronary angiogram.  For now, continue current medical therapy. HTN: The current medical regimen is effective;  continue present plan and medications. Hyperlipidemia: Lipids well controlled.  Continue current lipid-lowering therapy. Morbid obesity: He is trying to eat healthy.  He has lost a little bit of weight.  Continue to try to stay active and eat healthy. DOE/fatigue: Stable.  Likely multifactorial.  His low back pain limits his activities and this is led to some deconditioning.  Standing for long periods of time is also difficult. He reported some toe numbness as well.  2+ posterior tibial pulses bilaterally.  I suspect he may be developing some degree of neuropathy.  Follow-up with PCP.   Current medicines are reviewed at length with the patient today.  The patient concerns regarding his medicines were addressed.  The following changes have been made:  No change  Labs/ tests ordered today include:   Orders Placed This Encounter  Procedures   EKG 12-Lead    Recommend 150 minutes/week of aerobic exercise Low fat, low carb, high fiber diet recommended  Disposition:   FU in 6 months   Signed, Larae Grooms, MD  12/12/2021 5:15 PM    [facility]  [facility] [place], [place], [place]  [address] Phone: [phone]; Fax: [phone]

## 2021-12-12 ENCOUNTER — Ambulatory Visit: Payer: Medicare Other | Attending: Interventional Cardiology | Admitting: Interventional Cardiology

## 2021-12-12 ENCOUNTER — Encounter: Payer: Self-pay | Admitting: Interventional Cardiology

## 2021-12-12 VITALS — BP 110/72 | HR 70 | Ht 68.0 in | Wt 236.5 lb

## 2021-12-12 DIAGNOSIS — I25118 Atherosclerotic heart disease of native coronary artery with other forms of angina pectoris: Secondary | ICD-10-CM

## 2021-12-12 DIAGNOSIS — E782 Mixed hyperlipidemia: Secondary | ICD-10-CM

## 2021-12-12 DIAGNOSIS — R5383 Other fatigue: Secondary | ICD-10-CM

## 2021-12-12 DIAGNOSIS — I10 Essential (primary) hypertension: Secondary | ICD-10-CM | POA: Diagnosis not present

## 2021-12-12 DIAGNOSIS — R0609 Other forms of dyspnea: Secondary | ICD-10-CM

## 2021-12-12 NOTE — Patient Instructions (Signed)
Medication Instructions:  Your physician recommends that you continue on your current medications as directed. Please refer to the Current Medication list given to you today.  *If you need a refill on your cardiac medications before your next appointment, please call your pharmacy*   Lab Work: none If you have labs (blood work) drawn today and your tests are completely normal, you will receive your results only by: MyChart Message (if you have MyChart) OR A paper copy in the mail If you have any lab test that is abnormal or we need to change your treatment, we will call you to review the results.   Testing/Procedures: none   Follow-Up: At [facility], you and your health needs are our priority.  As part of our continuing mission to provide you with exceptional heart care, we have created designated Provider Care Teams.  These Care Teams include your primary Cardiologist (physician) and Advanced Practice Providers (APPs -  Physician Assistants and Nurse Practitioners) who all work together to provide you with the care you need, when you need it.  We recommend signing up for the patient portal called "MyChart".  Sign up information is provided on this After Visit Summary.  MyChart is used to connect with patients for Virtual Visits (Telemedicine).  Patients are able to view lab/test results, encounter notes, upcoming appointments, etc.  Non-urgent messages can be sent to your provider as well.   To learn more about what you can do with MyChart, go to [email or web address].    Your next appointment:   6 month(s)  The format for your next appointment:   In Person  Provider:   Jayadeep Varanasi, MD     Other Instructions    Important Information About Sugar       

## 2022-01-01 DIAGNOSIS — Z79891 Long term (current) use of opiate analgesic: Secondary | ICD-10-CM | POA: Diagnosis not present

## 2022-01-01 DIAGNOSIS — M47817 Spondylosis without myelopathy or radiculopathy, lumbosacral region: Secondary | ICD-10-CM | POA: Diagnosis not present

## 2022-01-01 DIAGNOSIS — M545 Low back pain, unspecified: Secondary | ICD-10-CM | POA: Diagnosis not present

## 2022-01-23 DIAGNOSIS — Z23 Encounter for immunization: Secondary | ICD-10-CM | POA: Diagnosis not present

## 2022-01-29 DIAGNOSIS — Z79891 Long term (current) use of opiate analgesic: Secondary | ICD-10-CM | POA: Diagnosis not present

## 2022-01-29 DIAGNOSIS — M47817 Spondylosis without myelopathy or radiculopathy, lumbosacral region: Secondary | ICD-10-CM | POA: Diagnosis not present

## 2022-01-29 DIAGNOSIS — M545 Low back pain, unspecified: Secondary | ICD-10-CM | POA: Diagnosis not present

## 2022-01-30 DIAGNOSIS — Z23 Encounter for immunization: Secondary | ICD-10-CM | POA: Diagnosis not present

## 2022-01-31 ENCOUNTER — Other Ambulatory Visit: Payer: Self-pay | Admitting: Interventional Cardiology

## 2022-02-09 ENCOUNTER — Other Ambulatory Visit: Payer: Self-pay | Admitting: Interventional Cardiology

## 2022-02-11 NOTE — Telephone Encounter (Signed)
Per LOV: Discontinued by: Danielle Dess, CMA on 12/12/2021 16:30  Reason: Change in therapy    Please advise on refill request. Thanks!

## 2022-02-26 DIAGNOSIS — Z79891 Long term (current) use of opiate analgesic: Secondary | ICD-10-CM | POA: Diagnosis not present

## 2022-02-26 DIAGNOSIS — M545 Low back pain, unspecified: Secondary | ICD-10-CM | POA: Diagnosis not present

## 2022-02-26 DIAGNOSIS — M47817 Spondylosis without myelopathy or radiculopathy, lumbosacral region: Secondary | ICD-10-CM | POA: Diagnosis not present

## 2022-04-05 ENCOUNTER — Other Ambulatory Visit: Payer: Self-pay | Admitting: Interventional Cardiology

## 2022-04-20 ENCOUNTER — Other Ambulatory Visit: Payer: Self-pay | Admitting: Interventional Cardiology

## 2022-04-20 DIAGNOSIS — R072 Precordial pain: Secondary | ICD-10-CM

## 2022-04-23 ENCOUNTER — Other Ambulatory Visit: Payer: Self-pay | Admitting: Interventional Cardiology

## 2022-04-30 ENCOUNTER — Other Ambulatory Visit: Payer: Self-pay

## 2022-04-30 MED ORDER — RANOLAZINE ER 500 MG PO TB12: 500.0000 mg | ORAL_TABLET | Freq: Every day | ORAL | 3 refills | Status: DC

## 2022-10-21 ENCOUNTER — Other Ambulatory Visit: Payer: Self-pay | Admitting: Interventional Cardiology

## 2022-10-22 ENCOUNTER — Other Ambulatory Visit: Payer: Self-pay | Admitting: *Deleted

## 2022-10-22 ENCOUNTER — Telehealth: Payer: Self-pay | Admitting: Interventional Cardiology

## 2022-10-22 MED ORDER — RANOLAZINE ER 500 MG PO TB12: 500.0000 mg | ORAL_TABLET | Freq: Every day | ORAL | 0 refills | Status: DC

## 2022-10-22 NOTE — Telephone Encounter (Signed)
*  STAT* If patient is at the pharmacy, call can be transferred to refill team.   1. Which medications need to be refilled? (please list name of each medication and dose if known) ranolazine (RANEXA) 500 MG 12 hr tablet   2. Which pharmacy/location (including street and city if local pharmacy) is medication to be sent to? [phone] [place],  - [address]   3. Do they need a 30 day or 90 day supply? 90

## 2022-11-04 ENCOUNTER — Telehealth: Payer: Self-pay | Admitting: Interventional Cardiology

## 2022-11-04 NOTE — Telephone Encounter (Signed)
Pt c/o of Chest Pain: STAT if CP now or developed within 24 hours  1. Are you having CP right now? No  2. Are you experiencing any other symptoms (ex. SOB, nausea, vomiting, sweating)? Sweating   3. How long have you been experiencing CP? 2 Months ago, but the chest pain is becoming very frequently.   4. Is your CP continuous or coming and going? Coming and going  5. Have you taken Nitroglycerin? Yes ?  Patient states the chest pain has been coming more frequently. Patient states that he can't do much physical activity without having to take a Nitroglycerin for the pain. Please advise.

## 2022-11-04 NOTE — Telephone Encounter (Signed)
Returned pt's call. Pt states that daily activities have become hard to do. He will feel tightness and pain in his chest when doing things like taking the trash out. Pt states he feels better at rest or after taking a nitroglycerin.  This is not happening everyday but has been happening more frequently over the past month or so. He states he does tend to take shorter breathes because of the pain in his chest but not necessary short of breathe. Pt would like to discuss change in medication dosage to help with this problem. Pt was due for follow up and put on schedule for 11/15/22. Pt was told 911/ED precautions. Told pt I would send our conversion over to Dr. Eldridge Dace and his nurse for review and they may call him before his appointment if they have other recommendations.

## 2022-11-05 MED ORDER — RANOLAZINE ER 500 MG PO TB12: 500.0000 mg | ORAL_TABLET | Freq: Two times a day (BID) | ORAL | 1 refills | Status: DC

## 2022-11-05 NOTE — Telephone Encounter (Signed)
Can try increasing Ranexa to 500 mg BID.  Will see him on 8/23.  If no improvement, will need to consider repeat catheterization.

## 2022-11-05 NOTE — Telephone Encounter (Signed)
Patient notified.  Prescription sent to [facility] [address].

## 2022-11-14 NOTE — H&P (View-Only) (Signed)
Cardiology Office Note   Date:  11/15/2022   ID:  Erik [name], DOB 21-May-1945, MRN [id number]  PCP:  Mila Palmer, MD    No chief complaint on file.  CAD  Wt Readings from Last 3 Encounters:  11/15/22 227 lb 3.2 oz (103.1 kg)  12/12/21 236 lb 8 oz (107.3 kg)  02/05/21 240 lb (108.9 kg)       History of Present Illness: Erik [name] is a 77 y.o. male   who had progressive anginal symptoms. He underwent cardiac catheterization in April 2017 which revealed a focal lesion in the circumflex. There was a chronic total occlusion of the RCA with left to right collaterals. Due to the lack of LAD disease noted, he was not referred for bypass and the plan was for medical therapy. He returns today for follow-up.   Of note, there is difficulty completing the heart cath from his right radial approach.    Cath in 2017, it was noted at the time of cath that his RCA is not a great candidate for percutaneous intervention of the CTO because there are multiple branch vessels which potentially would be lost, along this long chronic total occlusion.   Isosorbide was increased in April 2017. After this, he was better in terms of chest pain.      I recommended against testosterone supplement in the past.     He has lost 40 lbs between June 2017 until 10/18.    He has had cataract surgery.   Noted in 09/2018: "Rare CP and NTG use.  Not sure if it is cardiac but he takes one just in case.  He is now able to walk in stores rather than using a motorized chair." He has chronic DOE, particularly with stairs. He continues to walk the dog, no problems on flat ground.  Has some DOE with walking the dog.    He has had back and hip pain and received injections at a pain clinic.   He is eating less and has lost 15 lbs in 2022. Saw Dr. Jordan Likes for pain management, now seeing a different doctor in that practice. He did PT for chronic pain.  He walks with his dog.  He does some exercises along with a TV  exercise program.     He called in August 2024: "Pt states that daily activities have become hard to do. He will feel tightness and pain in his chest when doing things like taking the trash out. Pt states he feels better at rest or after taking a nitroglycerin. This is not happening everyday but has been happening more frequently over the past month or so. He states he does tend to take shorter breathes because of the pain in his chest but not necessary short of breathe. Pt would like to discuss change in medication dosage to help with this problem. "  Ranexa was increased to 500 twice daily.  He also notes that he ran out of his Prilosec at about the same time.  Wife reports that he is using nitroglycerin at least daily and sometimes several times a day due to exertional angina.  His activity level has dropped off significantly to minimize symptoms.  Past Medical History:  Diagnosis Date   History of echocardiogram    Echo 4/17 - mild concentric LVH, EF 55-60%, no RWMA, Gr 1 DD, mild AI, mild MR, mild LAE   Hypertension    Kidney stone    Obesity    Tinea corporis  Past Surgical History:  Procedure Laterality Date   CARDIAC CATHETERIZATION N/A 07/13/2015   Procedure: Left Heart Cath and Coronary Angiography;  Surgeon: Corky Crafts, MD;  Location: [facility];  Service: Cardiovascular;  Laterality: N/A;   TOTAL KNEE ARTHROPLASTY Bilateral      Current Outpatient Medications  Medication Sig Dispense Refill   aspirin 81 MG tablet Take 81 mg by mouth daily.     atorvastatin (LIPITOR) 10 MG tablet TAKE 1 TABLET BY MOUTH EVERY DAY 90 tablet 2   B Complex-C (SUPER B COMPLEX PO) Take 1 tablet by mouth daily.     Cholecalciferol (VITAMIN D3) 5000 units CAPS Take 1 capsule by mouth as directed.     CORAL CALCIUM PO Take 1 capsule by mouth daily.     ferrous sulfate 324 MG TBEC Take 324 mg by mouth.     Flaxseed, Linseed, (FLAXSEED OIL) 1000 MG CAPS Take 1 capsule by mouth  as directed.     folic acid (FOLVITE) 800 MCG tablet Take 400 mcg by mouth daily.     furosemide (LASIX) 40 MG tablet Take 40 mg by mouth as needed for fluid or edema.     HYDROcodone-acetaminophen (NORCO) 10-325 MG tablet 1 tablet as needed     isosorbide mononitrate (IMDUR) 60 MG 24 hr tablet TAKE 1 AND 1/2 TABLETS BY MOUTH ONCE DAILY 135 tablet 2   meloxicam (MOBIC) 15 MG tablet Take 15 mg by mouth daily.  0   metoprolol tartrate (LOPRESSOR) 25 MG tablet TAKE 1 TABLET BY MOUTH TWICE A DAY 180 tablet 2   mirtazapine (REMERON) 30 MG tablet Take 30 mg by mouth at bedtime.     Multiple Vitamin (MULITIVITAMIN WITH MINERALS) TABS Take 1 tablet by mouth daily. MEN'S PRIME     nitroGLYCERIN (NITROSTAT) 0.4 MG SL tablet DISSOLVE 1 TABLET UNDER TONGUE EVERY 5 MINUTES AS NEEDED FOR CHEST PAIN 25 tablet 6   omeprazole (PRILOSEC) 40 MG capsule Take 40 mg by mouth daily.     ramipril (ALTACE) 5 MG capsule TAKE 1 CAPSULE BY MOUTH EVERY DAY 90 capsule 0   ranolazine (RANEXA) 500 MG 12 hr tablet Take 1 tablet (500 mg total) by mouth 2 (two) times daily. 180 tablet 1   Saw Palmetto 450 MG CAPS Take 1 capsule by mouth daily.     Selenium 200 MCG CAPS Take 1 capsule by mouth as directed.     vitamin E 400 UNIT capsule Take 400 Units by mouth daily.     No current facility-administered medications for this visit.    Allergies:   Patient has no known allergies.    Social History:  The patient  reports that he has quit smoking. He has never used smokeless tobacco.   Family History:  The patient's family history includes Heart disease in his mother; Prostate cancer in his father.    ROS:  Please see the history of present illness.   Otherwise, review of systems are positive for low back pain.   All other systems are reviewed and negative.    PHYSICAL EXAM: VS:  BP 112/76   Pulse 68   Ht 5\' 8"  (1.727 m)   Wt 227 lb 3.2 oz (103.1 kg)   SpO2 96%   BMI 34.55 kg/m  , BMI Body mass index is 34.55  kg/m. GEN: Well nourished, well developed, in no acute distress HEENT: normal Neck: no JVD, carotid bruits, or masses Cardiac: RRR; no murmurs, rubs, or gallops,no  edema  Respiratory:  clear to auscultation bilaterally, normal work of breathing GI: soft, nontender, nondistended, + BS, obese MS: no deformity or atrophy Skin: warm and dry, no rash Neuro:  Strength and sensation are intact Psych: euthymic mood, full affect   EKG:   The ekg ordered today demonstrates normal sinus rhythm, no acute ST-T wave changes   Recent Labs: No results found for requested labs within last 365 days.   Lipid Panel    Component Value Date/Time   CHOL 174 04/22/2019 1045   TRIG 70 04/22/2019 1045   HDL 87 04/22/2019 1045   CHOLHDL 2.0 04/22/2019 1045   CHOLHDL 3 07/05/2009 0957   VLDL 19.6 07/05/2009 0957   LDLCALC 74 04/22/2019 1045     Other studies Reviewed: Additional studies/ records that were reviewed today with results demonstrating: labs reviewed.   ASSESSMENT AND PLAN:  CAD: Worsening anginal symptoms.  He is using nitroglycerin very frequently.  Ranexa was increased.  Although his PPI was stopped, his symptom constellation is concerning for worsening CAD and unstable angina.  Will plan for cardiac catheterization.  Risks and benefits were explained to the patient and all questions were answered.  Plan for right groin approach given trouble with the right wrist approach.  Hypertension: The current medical regimen is effective;  continue present plan and medications. Hyperlipidemia: Atorvastatin 10 mg daily. Morbid obesity: Long-term, he may benefit from weight loss. Dyspnea on exertion/fatigue: Low back pain also limits activities which has led to some deconditioning.   Current medicines are reviewed at length with the patient today.  The patient concerns regarding his medicines were addressed.  The following changes have been made:  No change  Labs/ tests ordered today include:    Orders Placed This Encounter  Procedures   EKG 12-Lead    Recommend 150 minutes/week of aerobic exercise Low fat, low carb, high fiber diet recommended  Disposition:   FU in post cath   Signed, Lance Muss, MD  11/15/2022 3:39 PM    [facility] [address] [place], [place], [place]  [address] Phone: [phone]; Fax: [phone]

## 2022-11-14 NOTE — Progress Notes (Signed)
Cardiology Office Note   Date:  11/15/2022   ID:  Erik [name], DOB 10/28/1945, MRN [id number]  PCP:  Erik Palmer, MD    No chief complaint on file.  CAD  Wt Readings from Last 3 Encounters:  11/15/22 227 lb 3.2 oz (103.1 kg)  12/12/21 236 lb 8 oz (107.3 kg)  02/05/21 240 lb (108.9 kg)       History of Present Illness: Erik [name] is a 77 y.o. male   who had progressive anginal symptoms. He underwent cardiac catheterization in April 2017 which revealed a focal lesion in the circumflex. There was a chronic total occlusion of the RCA with left to right collaterals. Due to the lack of LAD disease noted, he was not referred for bypass and the plan was for medical therapy. He returns today for follow-up.   Of note, there is difficulty completing the heart cath from his right radial approach.    Cath in 2017, it was noted at the time of cath that his RCA is not a great candidate for percutaneous intervention of the CTO because there are multiple branch vessels which potentially would be lost, along this long chronic total occlusion.   Isosorbide was increased in April 2017. After this, he was better in terms of chest pain.      I recommended against testosterone supplement in the past.     He has lost 40 lbs between June 2017 until 10/18.    He has had cataract surgery.   Noted in 09/2018: "Rare CP and NTG use.  Not sure if it is cardiac but he takes one just in case.  He is now able to walk in stores rather than using a motorized chair." He has chronic DOE, particularly with stairs. He continues to walk the dog, no problems on flat ground.  Has some DOE with walking the dog.    He has had back and hip pain and received injections at a pain clinic.   He is eating less and has lost 15 lbs in 2022. Saw Dr. Jordan [name] for pain management, now seeing a different doctor in that practice. He did PT for chronic pain.  He walks with his dog.  He does some exercises along with a TV  exercise program.     He called in August 2024: "Pt states that daily activities have become hard to do. He will feel tightness and pain in his chest when doing things like taking the trash out. Pt states he feels better at rest or after taking a nitroglycerin. This is not happening everyday but has been happening more frequently over the past month or so. He states he does tend to take shorter breathes because of the pain in his chest but not necessary short of breathe. Pt would like to discuss change in medication dosage to help with this problem. "  Ranexa was increased to 500 twice daily.  He also notes that he ran out of his Prilosec at about the same time.  Wife reports that he is using nitroglycerin at least daily and sometimes several times a day due to exertional angina.  His activity level has dropped off significantly to minimize symptoms.  Past Medical History:  Diagnosis Date   History of echocardiogram    Echo 4/17 - mild concentric LVH, EF 55-60%, no RWMA, Gr 1 DD, mild AI, mild MR, mild LAE   Hypertension    Kidney stone    Obesity    Tinea corporis  Past Surgical History:  Procedure Laterality Date   CARDIAC CATHETERIZATION N/A 07/13/2015   Procedure: Left Heart Cath and Coronary Angiography;  Surgeon: Erik Crafts, MD;  Location: [facility] INVASIVE CV LAB;  Service: Cardiovascular;  Laterality: N/A;   TOTAL KNEE ARTHROPLASTY Bilateral      Current Outpatient Medications  Medication Sig Dispense Refill   aspirin 81 MG tablet Take 81 mg by mouth daily.     atorvastatin (LIPITOR) 10 MG tablet TAKE 1 TABLET BY MOUTH EVERY DAY 90 tablet 2   B Complex-C (SUPER B COMPLEX PO) Take 1 tablet by mouth daily.     Cholecalciferol (VITAMIN D3) 5000 units CAPS Take 1 capsule by mouth as directed.     CORAL CALCIUM PO Take 1 capsule by mouth daily.     ferrous sulfate 324 MG TBEC Take 324 mg by mouth.     Flaxseed, Linseed, (FLAXSEED OIL) 1000 MG CAPS Take 1 capsule by mouth  as directed.     folic acid (FOLVITE) 800 MCG tablet Take 400 mcg by mouth daily.     furosemide (LASIX) 40 MG tablet Take 40 mg by mouth as needed for fluid or edema.     HYDROcodone-acetaminophen (NORCO) 10-325 MG tablet 1 tablet as needed     isosorbide mononitrate (IMDUR) 60 MG 24 hr tablet TAKE 1 AND 1/2 TABLETS BY MOUTH ONCE DAILY 135 tablet 2   meloxicam (MOBIC) 15 MG tablet Take 15 mg by mouth daily.  0   metoprolol tartrate (LOPRESSOR) 25 MG tablet TAKE 1 TABLET BY MOUTH TWICE A DAY 180 tablet 2   mirtazapine (REMERON) 30 MG tablet Take 30 mg by mouth at bedtime.     Multiple Vitamin (MULITIVITAMIN WITH MINERALS) TABS Take 1 tablet by mouth daily. MEN'S PRIME     nitroGLYCERIN (NITROSTAT) 0.4 MG SL tablet DISSOLVE 1 TABLET UNDER TONGUE EVERY 5 MINUTES AS NEEDED FOR CHEST PAIN 25 tablet 6   omeprazole (PRILOSEC) 40 MG capsule Take 40 mg by mouth daily.     ramipril (ALTACE) 5 MG capsule TAKE 1 CAPSULE BY MOUTH EVERY DAY 90 capsule 0   ranolazine (RANEXA) 500 MG 12 hr tablet Take 1 tablet (500 mg total) by mouth 2 (two) times daily. 180 tablet 1   Saw Palmetto 450 MG CAPS Take 1 capsule by mouth daily.     Selenium 200 MCG CAPS Take 1 capsule by mouth as directed.     vitamin E 400 UNIT capsule Take 400 Units by mouth daily.     No current facility-administered medications for this visit.    Allergies:   Patient has no known allergies.    Social History:  The patient  reports that he has quit smoking. He has never used smokeless tobacco.   Family History:  The patient's family history includes Heart disease in his mother; Prostate cancer in his father.    ROS:  Please see the history of present illness.   Otherwise, review of systems are positive for low back pain.   All other systems are reviewed and negative.    PHYSICAL EXAM: VS:  BP 112/76   Pulse 68   Ht 5\' 8"  (1.727 m)   Wt 227 lb 3.2 oz (103.1 kg)   SpO2 96%   BMI 34.55 kg/m  , BMI Body mass index is 34.55  kg/m. GEN: Well nourished, well developed, in no acute distress HEENT: normal Neck: no JVD, carotid bruits, or masses Cardiac: RRR; no murmurs, rubs, or gallops,no  edema  Respiratory:  clear to auscultation bilaterally, normal work of breathing GI: soft, nontender, nondistended, + BS, obese MS: no deformity or atrophy Skin: warm and dry, no rash Neuro:  Strength and sensation are intact Psych: euthymic mood, full affect   EKG:   The ekg ordered today demonstrates normal sinus rhythm, no acute ST-T wave changes   Recent Labs: No results found for requested labs within last 365 days.   Lipid Panel    Component Value Date/Time   CHOL 174 04/22/2019 1045   TRIG 70 04/22/2019 1045   HDL 87 04/22/2019 1045   CHOLHDL 2.0 04/22/2019 1045   CHOLHDL 3 07/05/2009 0957   VLDL 19.6 07/05/2009 0957   LDLCALC 74 04/22/2019 1045     Other studies Reviewed: Additional studies/ records that were reviewed today with results demonstrating: labs reviewed.   ASSESSMENT AND PLAN:  CAD: Worsening anginal symptoms.  He is using nitroglycerin very frequently.  Ranexa was increased.  Although his PPI was stopped, his symptom constellation is concerning for worsening CAD and unstable angina.  Will plan for cardiac catheterization.  Risks and benefits were explained to the patient and all questions were answered.  Plan for right groin approach given trouble with the right wrist approach.  Hypertension: The current medical regimen is effective;  continue present plan and medications. Hyperlipidemia: Atorvastatin 10 mg daily. Morbid obesity: Long-term, he may benefit from weight loss. Dyspnea on exertion/fatigue: Low back pain also limits activities which has led to some deconditioning.   Current medicines are reviewed at length with the patient today.  The patient concerns regarding his medicines were addressed.  The following changes have been made:  No change  Labs/ tests ordered today include:    Orders Placed This Encounter  Procedures   EKG 12-Lead    Recommend 150 minutes/week of aerobic exercise Low fat, low carb, high fiber diet recommended  Disposition:   FU in post cath   Signed, Lance Muss, MD  11/15/2022 3:39 PM    [facility] [address] [place], [place], [place]  [address] Phone: [phone]; Fax: [phone]

## 2022-11-15 ENCOUNTER — Ambulatory Visit: Payer: Medicare Other | Attending: Interventional Cardiology | Admitting: Interventional Cardiology

## 2022-11-15 ENCOUNTER — Encounter: Payer: Self-pay | Admitting: Interventional Cardiology

## 2022-11-15 VITALS — BP 112/76 | HR 68 | Ht 68.0 in | Wt 227.2 lb

## 2022-11-15 DIAGNOSIS — E782 Mixed hyperlipidemia: Secondary | ICD-10-CM

## 2022-11-15 DIAGNOSIS — I10 Essential (primary) hypertension: Secondary | ICD-10-CM

## 2022-11-15 DIAGNOSIS — R0609 Other forms of dyspnea: Secondary | ICD-10-CM

## 2022-11-15 DIAGNOSIS — I25118 Atherosclerotic heart disease of native coronary artery with other forms of angina pectoris: Secondary | ICD-10-CM | POA: Diagnosis not present

## 2022-11-15 MED ORDER — PANTOPRAZOLE SODIUM 40 MG PO TBEC: 40.0000 mg | DELAYED_RELEASE_TABLET | Freq: Every day | ORAL | 3 refills | Status: DC

## 2022-11-15 NOTE — Patient Instructions (Signed)
Medication Instructions:  Your physician has recommended you make the following change in your medication:  Start Protonix 40 mg by mouth daily   *If you need a refill on your cardiac medications before your next appointment, please call your pharmacy*   Lab Work: Lab work to be done today--CBC and BMP If you have labs (blood work) drawn today and your tests are completely normal, you will receive your results only by: MyChart Message (if you have MyChart) OR A paper copy in the mail If you have any lab test that is abnormal or we need to change your treatment, we will call you to review the results.   Testing/Procedures: Your physician has requested that you have a cardiac catheterization. Cardiac catheterization is used to diagnose and/or treat various heart conditions. Doctors may recommend this procedure for a number of different reasons. The most common reason is to evaluate chest pain. Chest pain can be a symptom of coronary artery disease (CAD), and cardiac catheterization can show whether plaque is narrowing or blocking your heart's arteries. This procedure is also used to evaluate the valves, as well as measure the blood flow and oxygen levels in different parts of your heart. For further information please visit [email or web address] Please follow instruction sheet, as given. Scheduled for August 27   Follow-Up: At [facility], you and your health needs are our priority.  As part of our continuing mission to provide you with exceptional heart care, we have created designated Provider Care Teams.  These Care Teams include your primary Cardiologist (physician) and Advanced Practice Providers (APPs -  Physician Assistants and Nurse Practitioners) who all work together to provide you with the care you need, when you need it.  We recommend signing up for the patient portal called "MyChart".  Sign up information is provided on this After Visit Summary.  MyChart is used to connect  with patients for Virtual Visits (Telemedicine).  Patients are able to view lab/test results, encounter notes, upcoming appointments, etc.  Non-urgent messages can be sent to your provider as well.   To learn more about what you can do with MyChart, go to [email or web address].    Your next appointment:   To be arranged after procedure  Provider:   Dr Eldridge Dace or APP     Other Instructions  [facility] A DEPT OF [facility]. [facility] AT [facility] [address] [place], [place] [address] [place] [address] Dept: [phone] Loc: [phone]  Erik [name]  11/15/2022  You are scheduled for a Cardiac Catheterization on Tuesday, August 27 with Dr. Lance Muss.  1. Please arrive at the [facility] (Main Entrance A) at [facility]: [address] [place], [place] [address] at 7:00 AM (This time is 2 hour(s) before your procedure to ensure your preparation). Free valet parking service is available. You will check in at ADMITTING. The support person will be asked to wait in the waiting room.  It is OK to have someone drop you off and come back when you are ready to be discharged.    Special note: Every effort is made to have your procedure done on time. Please understand that emergencies sometimes delay scheduled procedures.  2. Diet: Do not eat solid foods after midnight.  The patient may have clear liquids until 5am upon the day of the procedure.  3. Labs: done in office on August 23  4. Medication instructions in preparation for your procedure:   Contrast Allergy:  No  Do not take furosemide the morning of the procedure  On the morning of your procedure, take your Aspirin 81 mg and any morning medicines NOT listed above.  You may use sips of water.  5. Plan to go home the same day, you will only stay overnight if medically necessary. 6. Bring a current list of your medications and current insurance cards. 7. You MUST have a  responsible person to drive you home. 8. Someone MUST be with you the first 24 hours after you arrive home or your discharge will be delayed. 9. Please wear clothes that are easy to get on and off and wear slip-on shoes.  Thank you for allowing [place] to care for you!   --  Invasive Cardiovascular services .

## 2022-11-16 LAB — CBC
Hematocrit: 42.2 % (ref 37.5–51.0)
Hemoglobin: 14.5 g/dL (ref 13.0–17.7)
MCH: 35.8 pg — ABNORMAL HIGH (ref 26.6–33.0)
MCHC: 34.4 g/dL (ref 31.5–35.7)
MCV: 104 fL — ABNORMAL HIGH (ref 79–97)
Platelets: 246 10*3/uL (ref 150–450)
RBC: 4.05 x10E6/uL — ABNORMAL LOW (ref 4.14–5.80)
RDW: 11.8 % (ref 11.6–15.4)
WBC: 9.9 10*3/uL (ref 3.4–10.8)

## 2022-11-16 LAB — BASIC METABOLIC PANEL
BUN/Creatinine Ratio: 11 (ref 10–24)
BUN: 13 mg/dL (ref 8–27)
CO2: 23 mmol/L (ref 20–29)
Calcium: 9.5 mg/dL (ref 8.6–10.2)
Chloride: 98 mmol/L (ref 96–106)
Creatinine, Ser: 1.16 mg/dL (ref 0.76–1.27)
Glucose: 92 mg/dL (ref 70–99)
Potassium: 5.3 mmol/L — ABNORMAL HIGH (ref 3.5–5.2)
Sodium: 137 mmol/L (ref 134–144)
eGFR: 65 mL/min/{1.73_m2} (ref >59)

## 2022-11-18 ENCOUNTER — Telehealth: Payer: Self-pay | Admitting: *Deleted

## 2022-11-18 NOTE — Telephone Encounter (Signed)
Per pre-cert cardiac cath has been cancelled due to insurance coverage.

## 2022-11-18 NOTE — Telephone Encounter (Signed)
Cardiac Catheterization scheduled at [facility] for: Tuesday November 19, 2022  9 AM Arrival time [facility] Main Entrance A at: 7 AM  Nothing to eat after midnight prior to procedure, clear liquids until 5 AM day of procedure.  Medication instructions: -Usual morning medications can be taken with sips of water including aspirin 81 mg.  Plan to go home the same day, you will only stay overnight if medically necessary.  You must have responsible adult to drive you home.  Someone must be with you the first 24 hours after you arrive home.  Reviewed procedure instructions with patient.

## 2022-11-19 ENCOUNTER — Ambulatory Visit (HOSPITAL_COMMUNITY)
Admission: RE | Admit: 2022-11-19 | Payer: Medicare Other | Source: Home / Self Care | Admitting: Interventional Cardiology

## 2022-11-19 ENCOUNTER — Encounter (HOSPITAL_COMMUNITY): Admission: RE | Payer: Self-pay | Source: Home / Self Care

## 2022-11-19 SURGERY — LEFT HEART CATH AND CORONARY ANGIOGRAPHY
Anesthesia: LOCAL

## 2022-11-19 NOTE — Telephone Encounter (Signed)
Call from [facility] -time change for cath tomorrow-arrive 10 AM for 12 Noon procedure, pt aware.

## 2022-11-19 NOTE — Telephone Encounter (Signed)
Cardiac Catheterization scheduled at [facility] for: Wednesday November 20, 2022 8:30 AM Arrival time [facility] Main Entrance A at: 6:30 AM  Nothing to eat after midnight prior to procedure, clear liquids until 5 AM day of procedure.  Medication instructions: -Usual morning medications can be taken with sips of water including aspirin 81 mg.  Plan to go home the same day, you will only stay overnight if medically necessary.  You must have responsible adult to drive you home.  Someone must be with you the first 24 hours after you arrive home.  Reviewed procedure instructions with patient.

## 2022-11-20 ENCOUNTER — Ambulatory Visit (HOSPITAL_COMMUNITY)
Admission: RE | Disposition: A | Discharge status: Home / Self Care | Payer: Self-pay | Source: Home / Self Care | Attending: Interventional Cardiology

## 2022-11-20 ENCOUNTER — Other Ambulatory Visit: Payer: Self-pay | Admitting: Cardiology

## 2022-11-20 ENCOUNTER — Ambulatory Visit (HOSPITAL_COMMUNITY)
Admission: RE | Admit: 2022-11-20 | Discharge: 2022-11-20 | Disposition: A | Discharge status: Home / Self Care | Payer: Medicare Other | Source: Home / Self Care | Attending: Interventional Cardiology | Admitting: Interventional Cardiology

## 2022-11-20 ENCOUNTER — Other Ambulatory Visit: Payer: Self-pay

## 2022-11-20 DIAGNOSIS — I25118 Atherosclerotic heart disease of native coronary artery with other forms of angina pectoris: Secondary | ICD-10-CM | POA: Diagnosis present

## 2022-11-20 DIAGNOSIS — I2582 Chronic total occlusion of coronary artery: Secondary | ICD-10-CM | POA: Diagnosis not present

## 2022-11-20 DIAGNOSIS — I10 Essential (primary) hypertension: Secondary | ICD-10-CM | POA: Insufficient documentation

## 2022-11-20 DIAGNOSIS — I2584 Coronary atherosclerosis due to calcified coronary lesion: Secondary | ICD-10-CM | POA: Diagnosis not present

## 2022-11-20 DIAGNOSIS — Z87891 Personal history of nicotine dependence: Secondary | ICD-10-CM | POA: Diagnosis not present

## 2022-11-20 DIAGNOSIS — R5383 Other fatigue: Secondary | ICD-10-CM | POA: Diagnosis not present

## 2022-11-20 DIAGNOSIS — Z6834 Body mass index (BMI) 34.0-34.9, adult: Secondary | ICD-10-CM | POA: Insufficient documentation

## 2022-11-20 DIAGNOSIS — R0609 Other forms of dyspnea: Secondary | ICD-10-CM | POA: Diagnosis not present

## 2022-11-20 DIAGNOSIS — Z79899 Other long term (current) drug therapy: Secondary | ICD-10-CM | POA: Insufficient documentation

## 2022-11-20 DIAGNOSIS — I25110 Atherosclerotic heart disease of native coronary artery with unstable angina pectoris: Secondary | ICD-10-CM

## 2022-11-20 HISTORY — PX: LEFT HEART CATH AND CORONARY ANGIOGRAPHY: CATH118249

## 2022-11-20 SURGERY — LEFT HEART CATH AND CORONARY ANGIOGRAPHY
Anesthesia: LOCAL

## 2022-11-20 MED ORDER — HYDRALAZINE HCL 20 MG/ML IJ SOLN: 10.0000 mg | INTRAMUSCULAR | Status: DC | PRN

## 2022-11-20 MED ORDER — SODIUM CHLORIDE 0.9 % WEIGHT BASED INFUSION: 1.0000 mL/kg/h | INTRAVENOUS | Status: DC

## 2022-11-20 MED ORDER — MIDAZOLAM HCL 2 MG/2ML IJ SOLN
INTRAMUSCULAR | Status: AC
  Filled 2022-11-20: qty 2

## 2022-11-20 MED ORDER — ACETAMINOPHEN 325 MG PO TABS: 650.0000 mg | ORAL_TABLET | ORAL | Status: DC | PRN

## 2022-11-20 MED ORDER — LIDOCAINE HCL (PF) 1 % IJ SOLN
INTRAMUSCULAR | Status: DC | PRN
  Administered 2022-11-20: 10 mL

## 2022-11-20 MED ORDER — LABETALOL HCL 5 MG/ML IV SOLN: 10.0000 mg | INTRAVENOUS | Status: DC | PRN

## 2022-11-20 MED ORDER — SODIUM CHLORIDE 0.9 % IV SOLN: 250.0000 mL | INTRAVENOUS | Status: DC | PRN

## 2022-11-20 MED ORDER — FENTANYL CITRATE (PF) 100 MCG/2ML IJ SOLN
INTRAMUSCULAR | Status: DC | PRN
  Administered 2022-11-20: 25 ug via INTRAVENOUS

## 2022-11-20 MED ORDER — SODIUM CHLORIDE 0.9% FLUSH: 3.0000 mL | INTRAVENOUS | Status: DC | PRN

## 2022-11-20 MED ORDER — FENTANYL CITRATE (PF) 100 MCG/2ML IJ SOLN
INTRAMUSCULAR | Status: AC
  Filled 2022-11-20: qty 2

## 2022-11-20 MED ORDER — HEPARIN (PORCINE) IN NACL 1000-0.9 UT/500ML-% IV SOLN
INTRAVENOUS | Status: DC | PRN
  Administered 2022-11-20 (×2): 500 mL

## 2022-11-20 MED ORDER — SODIUM CHLORIDE 0.9% FLUSH: 3.0000 mL | Freq: Two times a day (BID) | INTRAVENOUS | Status: DC

## 2022-11-20 MED ORDER — ASPIRIN 81 MG PO CHEW: 81.0000 mg | CHEWABLE_TABLET | ORAL | Status: DC

## 2022-11-20 MED ORDER — ONDANSETRON HCL 4 MG/2ML IJ SOLN: 4.0000 mg | Freq: Four times a day (QID) | INTRAMUSCULAR | Status: DC | PRN

## 2022-11-20 MED ORDER — MIDAZOLAM HCL 2 MG/2ML IJ SOLN
INTRAMUSCULAR | Status: DC | PRN
  Administered 2022-11-20: 2 mg via INTRAVENOUS

## 2022-11-20 MED ORDER — VERAPAMIL HCL 2.5 MG/ML IV SOLN
INTRAVENOUS | Status: AC
  Filled 2022-11-20: qty 2

## 2022-11-20 MED ORDER — HEPARIN SODIUM (PORCINE) 1000 UNIT/ML IJ SOLN
INTRAMUSCULAR | Status: AC
  Filled 2022-11-20: qty 10

## 2022-11-20 MED ORDER — IOHEXOL 350 MG/ML SOLN
INTRAVENOUS | Status: DC | PRN
  Administered 2022-11-20: 50 mL

## 2022-11-20 MED ORDER — SODIUM CHLORIDE 0.9 % IV SOLN: INTRAVENOUS | Status: DC

## 2022-11-20 MED ORDER — SODIUM CHLORIDE 0.9 % WEIGHT BASED INFUSION
3.0000 mL/kg/h | INTRAVENOUS | Status: AC
  Administered 2022-11-20: 3 mL/kg/h via INTRAVENOUS

## 2022-11-20 MED ORDER — LIDOCAINE HCL (PF) 1 % IJ SOLN
INTRAMUSCULAR | Status: AC
  Filled 2022-11-20: qty 30

## 2022-11-20 SURGICAL SUPPLY — 7 items
CATH INFINITI 5FR MULTPACK ANG (CATHETERS) IMPLANT
CLOSURE MYNX CONTROL 5F (Vascular Products) IMPLANT
KIT MICROPUNCTURE NIT STIFF (SHEATH) IMPLANT
PACK CARDIAC CATHETERIZATION (CUSTOM PROCEDURE TRAY) ×1 IMPLANT
SET ATX-X65L (MISCELLANEOUS) IMPLANT
SHEATH PINNACLE 5F 10CM (SHEATH) IMPLANT
WIRE EMERALD 3MM-J .035X150CM (WIRE) IMPLANT

## 2022-11-20 NOTE — Discharge Instructions (Signed)
Femoral Site Care This sheet gives you information about how to care for yourself after your procedure. Your health care provider may also give you more specific instructions. If you have problems or questions, contact your health care provider. What can I expect after the procedure? After the procedure, it is common to have: Bruising that usually fades within 1-2 weeks. Tenderness at the site. Follow these instructions at home: Wound care May remove bandage after 24 hours. Do not take baths, swim, or use a hot tub for 5 days. You may shower 24-48 hours after the procedure. Gently wash the site with plain soap and water. Pat the area dry with a clean towel. Do not rub the site. This may cause bleeding. Do not apply powder or lotion to the site. Keep the site clean and dry. Check your femoral site every day for signs of infection. Check for: Redness, swelling, or pain. Fluid or blood. Warmth. Pus or a bad smell. Activity For the first 2-3 days after your procedure, or as long as directed: Avoid climbing stairs as much as possible. Do not squat. Do not lift, push or pull anything that is heavier than 10 lb for 5 days. Rest as directed. Avoid sitting for a long time without moving. Get up to take short walks every 1-2 hours. Do not drive for 24 hours. General instructions Take over-the-counter and prescription medicines only as told by your health care provider. Keep all follow-up visits as told by your health care provider. This is important. DRINK PLENTY OF FLUIDS FOR THE NEXT 2-3 DAYS. Contact a health care provider if you have: A fever or chills. You have redness, swelling, or pain around your insertion site. Get help right away if: The catheter insertion area swells very fast. You pass out. You suddenly start to sweat or your skin gets clammy. The catheter insertion area is bleeding, and the bleeding does not stop when you hold steady pressure on the area. The area near or  just beyond the catheter insertion site becomes pale, cool, tingly, or numb. These symptoms may represent a serious problem that is an emergency. Do not wait to see if the symptoms will go away. Get medical help right away. Call your local emergency services (911 in the [place] Do not drive yourself to the hospital. Summary After the procedure, it is common to have bruising that usually fades within 1-2 weeks. Check your femoral site every day for signs of infection. Do not lift, push or pull anything that is heavier than 10 lb for 5 days.  This information is not intended to replace advice given to you by your health care provider. Make sure you discuss any questions you have with your health care provider. Document Revised: 03/24/2017 Document Reviewed: 03/24/2017 Elsevier Patient Education  2020 Elsevier Inc.  

## 2022-11-20 NOTE — Progress Notes (Signed)
Pt ambulated without difficulty or bleeding.  Discharge instructions given to pt and wife  who verbalize understanding and deny further questions.  Discharged home with  wife who will drive and stay with pt x 24 hrs.

## 2022-11-20 NOTE — Interval H&P Note (Signed)
Cath Lab Visit (complete for each Cath Lab visit)  Clinical Evaluation Leading to the Procedure:   ACS: No.  Non-ACS:    Anginal Classification: CCS III  Anti-ischemic medical therapy: Minimal Therapy (1 class of medications)  Non-Invasive Test Results: No non-invasive testing performed  Prior CABG: No previous CABG      History and Physical Interval Note:  11/20/2022 3:08 PM  Erik [name]  has presented today for surgery, with the diagnosis of CAD and angina.  The various methods of treatment have been discussed with the patient and family. After consideration of risks, benefits and other options for treatment, the patient has consented to  Procedure(s): LEFT HEART CATH AND CORONARY ANGIOGRAPHY (N/A) as a surgical intervention.  The patient's history has been reviewed, patient examined, no change in status, stable for surgery.  I have reviewed the patient's chart and labs.  Questions were answered to the patient's satisfaction.     Lance Muss

## 2022-11-21 ENCOUNTER — Encounter (HOSPITAL_COMMUNITY): Payer: Self-pay | Admitting: Interventional Cardiology

## 2022-11-21 MED FILL — Lidocaine HCl Local Preservative Free (PF) Inj 1%: INTRAMUSCULAR | Qty: 30 | Status: AC

## 2022-11-21 MED FILL — Verapamil HCl IV Soln 2.5 MG/ML: INTRAVENOUS | Qty: 2 | Status: AC

## 2022-12-05 NOTE — Progress Notes (Unsigned)
[address]       [place] [address]             [phone]        [facility] Medical Record #[id number] Date of Birth: 1945-04-24  Referring: Corky Crafts, MD Primary Care: Mila Palmer, MD Primary Cardiologist:Jayadeep Eldridge Dace, MD  Chief Complaint:   No chief complaint on file.   History of Present Illness:     Erik [name] 77 y.o. male presents for surgical evaluation of ***  He called in August 2024: "Pt states that daily activities have become hard to do. He will feel tightness and pain in his chest when doing things like taking the trash out. Pt states he feels better at rest or after taking a nitroglycerin. This is not happening everyday but has been happening more frequently over the past month or so. He states he does tend to take shorter breathes because of the pain in his chest but not necessary short of breathe. Pt would like to discuss change in medication dosage to help with this problem.   Past Medical and Surgical History: Previous Chest Surgery: *** Previous Chest Radiation: *** Diabetes Mellitus: ***.  HbA1C *** Creatinine:  Lab Results  Component Value Date   CREATININE 1.16 11/15/2022   CREATININE 1.06 01/13/2020   CREATININE 1.13 04/22/2019     Past Medical History:  Diagnosis Date   History of echocardiogram    Echo 4/17 - mild concentric LVH, EF 55-60%, no RWMA, Gr 1 DD, mild AI, mild MR, mild LAE   Hypertension    Kidney stone    Obesity    Tinea corporis     Past Surgical History:  Procedure Laterality Date   CARDIAC CATHETERIZATION N/A 07/13/2015   Procedure: Left Heart Cath and Coronary Angiography;  Surgeon: Corky Crafts, MD;  Location: [facility] INVASIVE CV LAB;  Service: Cardiovascular;  Laterality: N/A;   LEFT HEART CATH AND CORONARY ANGIOGRAPHY N/A 11/20/2022   Procedure: LEFT HEART CATH AND CORONARY ANGIOGRAPHY;  Surgeon: Corky Crafts, MD;  Location: [facility] INVASIVE CV LAB;   Service: Cardiovascular;  Laterality: N/A;   TOTAL KNEE ARTHROPLASTY Bilateral     Social History:  Social History   Tobacco Use  Smoking Status Former  Smokeless Tobacco Never    Social History   Substance and Sexual Activity  Alcohol Use None     No Known Allergies  Medications: Asprin: *** Statin: *** Beta Blocker: *** Ace Inhibitor: *** Anti-Coagulation: ***  Current Outpatient Medications  Medication Sig Dispense Refill   Ascorbic Acid (VITAMIN C) 500 MG CAPS Take 500 mg by mouth every evening.     aspirin 81 MG tablet Take 81 mg by mouth daily.     atorvastatin (LIPITOR) 10 MG tablet TAKE 1 TABLET BY MOUTH EVERY DAY 90 tablet 2   b complex vitamins capsule Take 1 capsule by mouth daily.     Calcium Carb-Cholecalciferol (CALCIUM 600 + D PO) Take 1 tablet by mouth every evening.     Cholecalciferol (VITAMIN D3) 5000 units CAPS Take 5,000 Units by mouth daily.     ferrous sulfate 325 (65 FE) MG tablet Take 325 mg by mouth every evening.     fexofenadine (ALLEGRA) 180 MG tablet Take 180 mg by mouth daily.     Flaxseed, Linseed, (FLAXSEED OIL) 1000 MG CAPS Take 1,000 mg by mouth daily.     folic acid (FOLVITE) 800 MCG tablet Take  800 mcg by mouth daily.     HYDROcodone-acetaminophen (NORCO) 10-325 MG tablet Take 1 tablet by mouth 5 (five) times daily as needed for moderate pain.     isosorbide mononitrate (IMDUR) 60 MG 24 hr tablet TAKE 1 AND 1/2 TABLETS BY MOUTH ONCE DAILY 135 tablet 2   Magnesium 125 MG CAPS Take 125 mg by mouth every evening.     Melatonin 10 MG CAPS Take 10 mg by mouth at bedtime.     meloxicam (MOBIC) 15 MG tablet Take 15 mg by mouth daily.  0   methocarbamol (ROBAXIN) 500 MG tablet Take 500 mg by mouth at bedtime.     metoprolol tartrate (LOPRESSOR) 25 MG tablet TAKE 1 TABLET BY MOUTH TWICE A DAY 180 tablet 2   mirtazapine (REMERON) 30 MG tablet Take 30 mg by mouth at bedtime.     Multiple Vitamin (MULITIVITAMIN WITH MINERALS) TABS Take 1 tablet  by mouth daily.     nitroGLYCERIN (NITROSTAT) 0.4 MG SL tablet DISSOLVE 1 TABLET UNDER TONGUE EVERY 5 MINUTES AS NEEDED FOR CHEST PAIN 25 tablet 6   pantoprazole (PROTONIX) 40 MG tablet Take 1 tablet (40 mg total) by mouth daily. (Patient taking differently: Take 40 mg by mouth at bedtime.) 90 tablet 3   ramipril (ALTACE) 5 MG capsule TAKE 1 CAPSULE BY MOUTH EVERY DAY 90 capsule 0   ranolazine (RANEXA) 500 MG 12 hr tablet Take 1 tablet (500 mg total) by mouth 2 (two) times daily. 180 tablet 1   Saw Palmetto 450 MG CAPS Take 450 mg by mouth daily.     Selenium 200 MCG CAPS Take 200 mcg by mouth daily.     vitamin B-12 (CYANOCOBALAMIN) 500 MCG tablet Take 500 mcg by mouth daily.     vitamin E 400 UNIT capsule Take 400 Units by mouth daily.     No current facility-administered medications for this visit.    (Not in a hospital admission)   Family History  Problem Relation Age of Onset   Heart disease Mother    Prostate cancer Father    Heart attack Neg Hx    Stroke Neg Hx    Hypertension Neg Hx      Review of Systems:   ROS    Physical Exam: There were no vitals taken for this visit. Physical Exam    Diagnostic Studies & Laboratory data:    Left Heart Catherization:  Intervention  Echo: ***   EKG: *** I have independently reviewed the above radiologic studies and discussed with the patient   Recent Lab Findings: Lab Results  Component Value Date   WBC 9.9 11/15/2022   HGB 14.5 11/15/2022   HCT 42.2 11/15/2022   PLT 246 11/15/2022   GLUCOSE 92 11/15/2022   CHOL 174 04/22/2019   TRIG 70 04/22/2019   HDL 87 04/22/2019   LDLCALC 74 04/22/2019   ALT 32 04/22/2019   AST 34 04/22/2019   NA 137 11/15/2022   K 5.3 (H) 11/15/2022   CL 98 11/15/2022   CREATININE 1.16 11/15/2022   BUN 13 11/15/2022   CO2 23 11/15/2022   TSH 1.290 01/13/2020   INR 1.04 06/30/2015      Assessment / Plan:   *** CABG 4.  LAD, diag, OM, PDA No mitral  Pain clinic   I   spent {CHL ONC TIME VISIT - ZOXWR:[id number]} counseling the patient face to face.   Corliss Skains 12/05/2022 8:37 PM

## 2022-12-05 NOTE — H&P (View-Only) (Signed)
[address]       [place] [address]             [phone]        Kayaan Throop [facility] Medical Record #[id number] Date of Birth: 05-04-1945  Referring: Corky Crafts, MD Primary Care: Mila Palmer, MD Primary Cardiologist:Jayadeep Eldridge Dace, MD  Chief Complaint:    Chief Complaint  Patient presents with   Coronary Artery Disease    Cath 8/28, ECHO 9/13    History of Present Illness:     Erik [name] 77 y.o. male presents for surgical evaluation of 3V CAD.  This has progressed over the past few months.  He states that he takes nitroglycerin on a daily basis.      Past Medical and Surgical History: Previous Chest Surgery: no Previous Chest Radiation: no Diabetes Mellitus: no.  HbA1C pending Creatinine:  Lab Results  Component Value Date   CREATININE 1.16 11/15/2022   CREATININE 1.06 01/13/2020   CREATININE 1.13 04/22/2019     Past Medical History:  Diagnosis Date   History of echocardiogram    Echo 4/17 - mild concentric LVH, EF 55-60%, no RWMA, Gr 1 DD, mild AI, mild MR, mild LAE   Hypertension    Kidney stone    Obesity    Tinea corporis     Past Surgical History:  Procedure Laterality Date   CARDIAC CATHETERIZATION N/A 07/13/2015   Procedure: Left Heart Cath and Coronary Angiography;  Surgeon: Corky Crafts, MD;  Location: [facility] INVASIVE CV LAB;  Service: Cardiovascular;  Laterality: N/A;   LEFT HEART CATH AND CORONARY ANGIOGRAPHY N/A 11/20/2022   Procedure: LEFT HEART CATH AND CORONARY ANGIOGRAPHY;  Surgeon: Corky Crafts, MD;  Location: [facility] INVASIVE CV LAB;  Service: Cardiovascular;  Laterality: N/A;   TOTAL KNEE ARTHROPLASTY Bilateral     Social History:  Social History   Tobacco Use  Smoking Status Former  Smokeless Tobacco Never    Social History   Substance and Sexual Activity  Alcohol Use None     No Known Allergies    Current Outpatient Medications  Medication Sig Dispense Refill    Ascorbic Acid (VITAMIN C) 500 MG CAPS Take 500 mg by mouth every evening. Chewable     aspirin 81 MG tablet Take 81 mg by mouth.     atorvastatin (LIPITOR) 10 MG tablet TAKE 1 TABLET BY MOUTH EVERY DAY 90 tablet 2   b complex vitamins capsule Take 1 capsule by mouth daily.     Calcium Carb-Cholecalciferol (CALCIUM 600 + D PO) Take 1 tablet by mouth every evening.     Cholecalciferol (VITAMIN D3) 5000 units CAPS Take 5,000 Units by mouth every morning.     ferrous sulfate 325 (65 FE) MG tablet Take 325 mg by mouth every evening.     fexofenadine (ALLEGRA) 180 MG tablet Take 180 mg by mouth daily.     Flaxseed, Linseed, (FLAXSEED OIL) 1000 MG CAPS Take 1,000 mg by mouth daily.     folic acid (FOLVITE) 800 MCG tablet Take 800 mcg by mouth daily.     HYDROcodone-acetaminophen (NORCO) 10-325 MG tablet Take 1 tablet by mouth 5 (five) times daily as needed for moderate pain.     isosorbide mononitrate (IMDUR) 60 MG 24 hr tablet TAKE 1 AND 1/2 TABLETS BY MOUTH ONCE DAILY 135 tablet 2   Magnesium 125 MG CAPS Take 125 mg by mouth at bedtime.     Melatonin  10 MG CAPS Take 10 mg by mouth at bedtime.     meloxicam (MOBIC) 15 MG tablet Take 15 mg by mouth daily.  0   methocarbamol (ROBAXIN) 500 MG tablet Take 500 mg by mouth daily as needed for muscle spasms.     metoprolol tartrate (LOPRESSOR) 25 MG tablet TAKE 1 TABLET BY MOUTH TWICE A DAY 180 tablet 2   mirtazapine (REMERON) 30 MG tablet Take 30 mg by mouth at bedtime.     Multiple Vitamin (MULITIVITAMIN WITH MINERALS) TABS Take 1 tablet by mouth daily. Men     nitroGLYCERIN (NITROSTAT) 0.4 MG SL tablet DISSOLVE 1 TABLET UNDER TONGUE EVERY 5 MINUTES AS NEEDED FOR CHEST PAIN 25 tablet 6   pantoprazole (PROTONIX) 40 MG tablet Take 1 tablet (40 mg total) by mouth daily. (Patient taking differently: Take 40 mg by mouth at bedtime.) 90 tablet 3   ramipril (ALTACE) 5 MG capsule TAKE 1 CAPSULE BY MOUTH EVERY DAY 90 capsule 0   ranolazine (RANEXA) 500 MG 12 hr  tablet Take 1 tablet (500 mg total) by mouth 2 (two) times daily. 180 tablet 1   Saw [place] 450 MG CAPS Take 450 mg by mouth every morning.     Selenium 200 MCG CAPS Take 200 mcg by mouth every morning.     vitamin B-12 (CYANOCOBALAMIN) 500 MCG tablet Take 500 mcg by mouth daily.     vitamin E 400 UNIT capsule Take 400 Units by mouth daily.     furosemide (LASIX) 40 MG tablet Take 40 mg by mouth daily as needed (Swelling).     OVER THE COUNTER MEDICATION Take 1 tablet by mouth daily. Unforgettable memory supplement     No current facility-administered medications for this visit.    (Not in a hospital admission)   Family History  Problem Relation Age of Onset   Heart disease Mother    Prostate cancer Father    Heart attack Neg Hx    Stroke Neg Hx    Hypertension Neg Hx      Review of Systems:   Review of Systems  Constitutional:  Positive for malaise/fatigue.  Respiratory:  Positive for shortness of breath.   Cardiovascular:  Positive for chest pain.  Neurological: Negative.       Physical Exam: BP 123/74 (BP Location: Right Arm, Patient Position: Sitting)   Pulse 64   Resp 18   Ht 5\' 10"  (1.778 m)   Wt 229 lb (103.9 kg)   SpO2 93% Comment: RA  BMI 32.86 kg/m  Physical Exam Constitutional:      General: He is not in acute distress. HENT:     Head: Normocephalic and atraumatic.  Eyes:     Extraocular Movements: Extraocular movements intact.  Cardiovascular:     Rate and Rhythm: Normal rate.  Pulmonary:     Effort: Pulmonary effort is normal. No respiratory distress.  Abdominal:     General: There is no distension.  Musculoskeletal:        General: Normal range of motion.     Cervical back: Normal range of motion.  Skin:    General: Skin is warm and dry.  Neurological:     General: No focal deficit present.     Mental Status: He is alert and oriented to person, place, and time.       Diagnostic Studies & Laboratory data:    Left Heart  Catherization:  Intervention  Echo: IMPRESSIONS     1. Left ventricular ejection  fraction, by estimation, is 70 to 75%. Left  ventricular ejection fraction by 3D volume is 71 %. The left ventricle has  hyperdynamic function. The left ventricle has no regional wall motion  abnormalities. There is mild left  ventricular hypertrophy. Left ventricular diastolic parameters are  consistent with Grade I diastolic dysfunction (impaired relaxation).   2. Right ventricular systolic function is normal. The right ventricular  size is mildly enlarged. There is normal pulmonary artery systolic  pressure. The estimated right ventricular systolic pressure is 28.4 mmHg.   3. The mitral valve is abnormal. Mild to moderate mitral valve  regurgitation.   4. The aortic valve is tricuspid. Aortic valve regurgitation is mild.  Aortic valve sclerosis is present, with no evidence of aortic valve  stenosis.   5. Aortic dilatation noted. There is borderline dilatation of the aortic  root, measuring 39 mm.   6. The inferior vena cava is normal in size with greater than 50%  respiratory variability, suggesting right atrial pressure of 3 mmHg.     EKG: Sinus  I have independently reviewed the above radiologic studies and discussed with the patient   Recent Lab Findings: Lab Results  Component Value Date   WBC 9.9 11/15/2022   HGB 14.5 11/15/2022   HCT 42.2 11/15/2022   PLT 246 11/15/2022   GLUCOSE 92 11/15/2022   CHOL 174 04/22/2019   TRIG 70 04/22/2019   HDL 87 04/22/2019   LDLCALC 74 04/22/2019   ALT 32 04/22/2019   AST 34 04/22/2019   NA 137 11/15/2022   K 5.3 (H) 11/15/2022   CL 98 11/15/2022   CREATININE 1.16 11/15/2022   BUN 13 11/15/2022   CO2 23 11/15/2022   TSH 1.290 01/13/2020   INR 1.04 06/30/2015      Assessment / Plan:   77yo male with 3V CAD.  He has preserved biventricular function, and mild to moderate mitral valve regurgitation.  On review of his echo, the MR appears  central.  The risks and benefits of CABG were discussed in detail.  The patient is agreeable to proceed.  He also is followed in a pain clinic, and I instructed him to inform them that he is about to have surgery.      I  spent 40 minutes counseling the patient face to face.   Corliss Skains 12/08/2022 10:01 AM

## 2022-12-06 ENCOUNTER — Ambulatory Visit (HOSPITAL_COMMUNITY): Payer: Medicare Other | Attending: Internal Medicine

## 2022-12-06 ENCOUNTER — Telehealth: Payer: Self-pay | Admitting: Interventional Cardiology

## 2022-12-06 ENCOUNTER — Encounter: Payer: Self-pay | Admitting: *Deleted

## 2022-12-06 ENCOUNTER — Institutional Professional Consult (permissible substitution): Payer: Medicare Other | Admitting: Thoracic Surgery (Cardiothoracic Vascular Surgery)

## 2022-12-06 ENCOUNTER — Other Ambulatory Visit: Payer: Self-pay | Admitting: *Deleted

## 2022-12-06 VITALS — BP 123/74 | HR 64 | Resp 18 | Ht 70.0 in | Wt 229.0 lb

## 2022-12-06 DIAGNOSIS — I25118 Atherosclerotic heart disease of native coronary artery with other forms of angina pectoris: Secondary | ICD-10-CM

## 2022-12-06 DIAGNOSIS — I25110 Atherosclerotic heart disease of native coronary artery with unstable angina pectoris: Secondary | ICD-10-CM | POA: Insufficient documentation

## 2022-12-06 LAB — ECHOCARDIOGRAM COMPLETE
Area-P 1/2: 3.53 cm2
P 1/2 time: 568 ms
S' Lateral: 2.8 cm

## 2022-12-06 NOTE — Pre-Procedure Instructions (Signed)
Surgical Instructions   Your procedure is scheduled on Wednesday, September 18th. Report to [facility] Main Entrance "A" at 06:30 A.M., then check in with the Admitting office. Any questions or running late day of surgery: call [phone]  Questions prior to your surgery date: call [phone], Monday-Friday, 8am-4pm. If you experience any cold or flu symptoms such as cough, fever, chills, shortness of breath, etc. between now and your scheduled surgery, please notify us at the above number.     Remember:  Do not eat or drink after midnight the night before your surgery     Take these medicines the morning of surgery with A SIP OF WATER  atorvastatin (LIPITOR)  fexofenadine (ALLEGRA)  isosorbide mononitrate (IMDUR)  metoprolol tartrate (LOPRESSOR)  ranolazine (RANEXA)     May take these medicines IF NEEDED: HYDROcodone-acetaminophen (NORCO)  methocarbamol (ROBAXIN)  nitroGLYCERIN (NITROSTAT)- if you have to take this medication prior to surgery, please call one of the above phone numbers and report this to a Nurse.    One week prior to surgery, STOP taking any Aleve, meloxicam (MOBIC), Naproxen, Ibuprofen, Motrin, Advil, Goody's, BC's, all herbal medications, fish oil, and non-prescription vitamins.                     Do NOT Smoke (Tobacco/Vaping) for 24 hours prior to your procedure.  If you use a CPAP at night, you may bring your mask/headgear for your overnight stay.   You will be asked to remove any contacts, glasses, piercing's, hearing aid's, dentures/partials prior to surgery. Please bring cases for these items if needed.    Patients discharged the day of surgery will not be allowed to drive home, and someone needs to stay with them for 24 hours.  SURGICAL WAITING ROOM VISITATION Patients may have no more than 2 support people in the waiting area - these visitors may rotate.   Pre-op nurse will coordinate an appropriate time for 1 ADULT support person, who may  not rotate, to accompany patient in pre-op.  Children under the age of 62 must have an adult with them who is not the patient and must remain in the main waiting area with an adult.  If the patient needs to stay at the hospital during part of their recovery, the visitor guidelines for inpatient rooms apply.  Please refer to the [facility] website for the visitor guidelines for any additional information.   If you received a COVID test during your pre-op visit  it is requested that you wear a mask when out in public, stay away from anyone that may not be feeling well and notify your surgeon if you develop symptoms. If you have been in contact with anyone that has tested positive in the last 10 days please notify you surgeon.      Pre-operative CHG Bathing Instructions   You can play a key role in reducing the risk of infection after surgery. Your skin needs to be as free of germs as possible. You can reduce the number of germs on your skin by washing with CHG (chlorhexidine gluconate) soap before surgery. CHG is an antiseptic soap that kills germs and continues to kill germs even after washing.   DO NOT use if you have an allergy to chlorhexidine/CHG or antibacterial soaps. If your skin becomes reddened or irritated, stop using the CHG and notify one of our RNs at [phone].              TAKE A SHOWER  THE NIGHT BEFORE SURGERY AND THE DAY OF SURGERY    Please keep in mind the following:  DO NOT shave, including legs and underarms, 48 hours prior to surgery.   You may shave your face before/day of surgery.  Place clean sheets on your bed the night before surgery Use a clean washcloth (not used since being washed) for each shower. DO NOT sleep with pet's night before surgery.  CHG Shower Instructions:  If you choose to wash your hair and private area, wash first with your normal shampoo/soap.  After you use shampoo/soap, rinse your hair and body thoroughly to remove shampoo/soap  residue.  Turn the water OFF and apply half the bottle of CHG soap to a CLEAN washcloth.  Apply CHG soap ONLY FROM YOUR NECK DOWN TO YOUR TOES (washing for 3-5 minutes)  DO NOT use CHG soap on face, private areas, open wounds, or sores.  Pay special attention to the area where your surgery is being performed.  If you are having back surgery, having someone wash your back for you may be helpful. Wait 2 minutes after CHG soap is applied, then you may rinse off the CHG soap.  Pat dry with a clean towel  Put on clean pajamas    Additional instructions for the day of surgery: DO NOT APPLY any lotions, deodorants, cologne, or perfumes.   Do not wear jewelry or makeup Do not wear nail polish, gel polish, artificial nails, or any other type of covering on natural nails (fingers and toes) Do not bring valuables to the hospital. [facility] is not responsible for valuables/personal belongings. Put on clean/comfortable clothes.  Please brush your teeth.  Ask your nurse before applying any prescription medications to the skin.

## 2022-12-06 NOTE — Telephone Encounter (Signed)
PT came in this morning for a ECHOS AND ASK TO LEAVE A MESSAGE FOR DR. VARANASI TO CALL HIM.

## 2022-12-09 ENCOUNTER — Ambulatory Visit (HOSPITAL_BASED_OUTPATIENT_CLINIC_OR_DEPARTMENT_OTHER)
Admission: RE | Admit: 2022-12-09 | Discharge: 2022-12-09 | Disposition: A | Discharge status: Home / Self Care | Payer: Medicare Other | Source: Ambulatory Visit | Attending: Thoracic Surgery (Cardiothoracic Vascular Surgery) | Admitting: Thoracic Surgery (Cardiothoracic Vascular Surgery)

## 2022-12-09 ENCOUNTER — Encounter (HOSPITAL_COMMUNITY): Payer: Self-pay

## 2022-12-09 ENCOUNTER — Other Ambulatory Visit: Payer: Self-pay

## 2022-12-09 ENCOUNTER — Encounter (HOSPITAL_COMMUNITY)
Admission: RE | Admit: 2022-12-09 | Discharge: 2022-12-09 | Disposition: A | Discharge status: Home / Self Care | Payer: Medicare Other | Source: Ambulatory Visit | Attending: Thoracic Surgery (Cardiothoracic Vascular Surgery) | Admitting: Thoracic Surgery (Cardiothoracic Vascular Surgery)

## 2022-12-09 ENCOUNTER — Ambulatory Visit (HOSPITAL_COMMUNITY)
Admission: RE | Admit: 2022-12-09 | Discharge: 2022-12-09 | Disposition: A | Discharge status: Home / Self Care | Payer: Medicare Other | Source: Ambulatory Visit | Attending: Thoracic Surgery (Cardiothoracic Vascular Surgery) | Admitting: Thoracic Surgery (Cardiothoracic Vascular Surgery)

## 2022-12-09 VITALS — BP 137/73 | HR 63 | Temp 97.8°F | Resp 18 | Ht 70.0 in | Wt 229.1 lb

## 2022-12-09 DIAGNOSIS — I10 Essential (primary) hypertension: Secondary | ICD-10-CM | POA: Insufficient documentation

## 2022-12-09 DIAGNOSIS — Z1152 Encounter for screening for COVID-19: Secondary | ICD-10-CM | POA: Insufficient documentation

## 2022-12-09 DIAGNOSIS — I2510 Atherosclerotic heart disease of native coronary artery without angina pectoris: Secondary | ICD-10-CM | POA: Insufficient documentation

## 2022-12-09 DIAGNOSIS — I25118 Atherosclerotic heart disease of native coronary artery with other forms of angina pectoris: Secondary | ICD-10-CM | POA: Insufficient documentation

## 2022-12-09 DIAGNOSIS — Z01818 Encounter for other preprocedural examination: Secondary | ICD-10-CM

## 2022-12-09 HISTORY — DX: Gastro-esophageal reflux disease without esophagitis: K21.9

## 2022-12-09 HISTORY — DX: Atherosclerotic heart disease of native coronary artery without angina pectoris: I25.10

## 2022-12-09 HISTORY — DX: Unspecified osteoarthritis, unspecified site: M19.90

## 2022-12-09 LAB — URINALYSIS, ROUTINE W REFLEX MICROSCOPIC
Bilirubin Urine: NEGATIVE
Glucose, UA: NEGATIVE mg/dL
Hgb urine dipstick: NEGATIVE
Ketones, ur: NEGATIVE mg/dL
Leukocytes,Ua: NEGATIVE
Nitrite: NEGATIVE
Protein, ur: NEGATIVE mg/dL
Specific Gravity, Urine: 1.011 (ref 1.005–1.030)
pH: 6 (ref 5.0–8.0)

## 2022-12-09 LAB — CBC
HCT: 41.5 % (ref 39.0–52.0)
Hemoglobin: 14.5 g/dL (ref 13.0–17.0)
MCH: 35.4 pg — ABNORMAL HIGH (ref 26.0–34.0)
MCHC: 34.9 g/dL (ref 30.0–36.0)
MCV: 101.2 fL — ABNORMAL HIGH (ref 80.0–100.0)
Platelets: 228 10*3/uL (ref 150–400)
RBC: 4.1 MIL/uL — ABNORMAL LOW (ref 4.22–5.81)
RDW: 12.6 % (ref 11.5–15.5)
WBC: 10.1 10*3/uL (ref 4.0–10.5)
nRBC: 0 % (ref 0.0–0.2)

## 2022-12-09 LAB — COMPREHENSIVE METABOLIC PANEL
ALT: 31 U/L (ref 0–44)
AST: 44 U/L — ABNORMAL HIGH (ref 15–41)
Albumin: 3.8 g/dL (ref 3.5–5.0)
Alkaline Phosphatase: 57 U/L (ref 38–126)
Anion gap: 10 (ref 5–15)
BUN: 7 mg/dL — ABNORMAL LOW (ref 8–23)
CO2: 21 mmol/L — ABNORMAL LOW (ref 22–32)
Calcium: 8.8 mg/dL — ABNORMAL LOW (ref 8.9–10.3)
Chloride: 105 mmol/L (ref 98–111)
Creatinine, Ser: 0.98 mg/dL (ref 0.61–1.24)
GFR, Estimated: >60 mL/min (ref >60)
Glucose, Bld: 101 mg/dL — ABNORMAL HIGH (ref 70–99)
Potassium: 4.9 mmol/L (ref 3.5–5.1)
Sodium: 136 mmol/L (ref 135–145)
Total Bilirubin: 0.8 mg/dL (ref 0.3–1.2)
Total Protein: 6.4 g/dL — ABNORMAL LOW (ref 6.5–8.1)

## 2022-12-09 LAB — BLOOD GAS, ARTERIAL
Acid-Base Excess: 1 mmol/L (ref 0.0–2.0)
Bicarbonate: 25.2 mmol/L (ref 20.0–28.0)
Drawn by: 58793
O2 Saturation: 99.2 %
Patient temperature: 37
pCO2 arterial: 38 mmHg (ref 32–48)
pH, Arterial: 7.43 (ref 7.35–7.45)
pO2, Arterial: 92 mmHg (ref 83–108)

## 2022-12-09 LAB — TYPE AND SCREEN
ABO/RH(D): O POS
Antibody Screen: NEGATIVE

## 2022-12-09 LAB — VAS US DOPPLER PRE CABG: Right ABI: 1.42

## 2022-12-09 LAB — HEMOGLOBIN A1C
Hgb A1c MFr Bld: 4.9 % (ref 4.8–5.6)
Mean Plasma Glucose: 93.93 mg/dL

## 2022-12-09 LAB — PROTIME-INR
INR: 1 (ref 0.8–1.2)
Prothrombin Time: 13.8 s (ref 11.4–15.2)

## 2022-12-09 LAB — SURGICAL PCR SCREEN
MRSA, PCR: NEGATIVE
Staphylococcus aureus: NEGATIVE

## 2022-12-09 LAB — APTT: aPTT: 30 seconds (ref 24–36)

## 2022-12-09 NOTE — Telephone Encounter (Signed)
I spoke with patient and reviewed echo results with him Patient reports his wife would like to speak with Dr Eldridge Dace.  She can be reached at [phone] (M)

## 2022-12-09 NOTE — Progress Notes (Signed)
PCP - Dr. Mila Palmer Cardiologist - Dr. Lance Muss  PPM/ICD - denies   Chest x-ray - 12/09/22 EKG - 11/15/22 Stress Test - 12/14/12 ECHO - 12/06/22 Cardiac Cath - 11/20/22  Sleep Study - denies   DM- denies  Blood Thinner Instructions: n/a Aspirin Instructions: hold DOS  ERAS Protcol - no, NPO   COVID TEST- 12/09/22   Anesthesia review: yes, cardiac hx  Patient denies shortness of breath, fever, cough and chest pain at PAT appointment   All instructions explained to the patient, with a verbal understanding of the material. Patient agrees to go over the instructions while at home for a better understanding. Patient also instructed to wear a mask in public after being tested for COVID-19. The opportunity to ask questions was provided.

## 2022-12-10 LAB — SARS CORONAVIRUS 2 (TAT 6-24 HRS): SARS Coronavirus 2: NEGATIVE

## 2022-12-10 MED ORDER — INSULIN REGULAR(HUMAN) IN NACL 100-0.9 UT/100ML-% IV SOLN
INTRAVENOUS | Status: AC
  Administered 2022-12-11: 1.1 [IU]/h via INTRAVENOUS
  Filled 2022-12-10: qty 100

## 2022-12-10 MED ORDER — PHENYLEPHRINE HCL-NACL 20-0.9 MG/250ML-% IV SOLN
30.0000 ug/min | INTRAVENOUS | Status: AC
  Administered 2022-12-11: 20 ug/min via INTRAVENOUS
  Filled 2022-12-10: qty 250

## 2022-12-10 MED ORDER — NITROGLYCERIN IN D5W 200-5 MCG/ML-% IV SOLN
2.0000 ug/min | INTRAVENOUS | Status: AC
  Administered 2022-12-11: 10 ug/min via INTRAVENOUS
  Filled 2022-12-10: qty 250

## 2022-12-10 MED ORDER — POTASSIUM CHLORIDE 2 MEQ/ML IV SOLN
80.0000 meq | INTRAVENOUS | Status: DC
  Filled 2022-12-10: qty 40

## 2022-12-10 MED ORDER — CEFAZOLIN SODIUM-DEXTROSE 2-4 GM/100ML-% IV SOLN
2.0000 g | INTRAVENOUS | Status: AC
  Administered 2022-12-11 (×2): 2 g via INTRAVENOUS
  Filled 2022-12-10: qty 100

## 2022-12-10 MED ORDER — TRANEXAMIC ACID (OHS) BOLUS VIA INFUSION
15.0000 mg/kg | INTRAVENOUS | Status: AC
  Administered 2022-12-11: 1558.5 mg via INTRAVENOUS
  Filled 2022-12-10: qty 1559

## 2022-12-10 MED ORDER — MILRINONE LACTATE IN DEXTROSE 20-5 MG/100ML-% IV SOLN
0.3000 ug/kg/min | INTRAVENOUS | Status: DC
  Filled 2022-12-10: qty 100

## 2022-12-10 MED ORDER — TRANEXAMIC ACID (OHS) PUMP PRIME SOLUTION
2.0000 mg/kg | INTRAVENOUS | Status: DC
  Filled 2022-12-10: qty 2.08

## 2022-12-10 MED ORDER — NOREPINEPHRINE 4 MG/250ML-% IV SOLN
0.0000 ug/min | INTRAVENOUS | Status: DC
  Filled 2022-12-10: qty 250

## 2022-12-10 MED ORDER — EPINEPHRINE HCL 5 MG/250ML IV SOLN IN NS
0.0000 ug/min | INTRAVENOUS | Status: DC
  Filled 2022-12-10: qty 250

## 2022-12-10 MED ORDER — TRANEXAMIC ACID 1000 MG/10ML IV SOLN
1.5000 mg/kg/h | INTRAVENOUS | Status: AC
  Administered 2022-12-11: 1.5 mg/kg/h via INTRAVENOUS
  Filled 2022-12-10: qty 25

## 2022-12-10 MED ORDER — PLASMA-LYTE A IV SOLN
INTRAVENOUS | Status: DC
  Filled 2022-12-10: qty 2.5

## 2022-12-10 MED ORDER — MANNITOL 20 % IV SOLN
INTRAVENOUS | Status: DC
  Filled 2022-12-10: qty 13

## 2022-12-10 MED ORDER — VANCOMYCIN HCL 1500 MG/300ML IV SOLN
1500.0000 mg | INTRAVENOUS | Status: AC
  Administered 2022-12-11: 1500 mg via INTRAVENOUS
  Filled 2022-12-10: qty 300

## 2022-12-10 MED ORDER — DEXMEDETOMIDINE HCL IN NACL 400 MCG/100ML IV SOLN
0.1000 ug/kg/h | INTRAVENOUS | Status: AC
  Administered 2022-12-11: .3 ug/kg/h via INTRAVENOUS
  Filled 2022-12-10: qty 100

## 2022-12-10 MED ORDER — CEFAZOLIN SODIUM-DEXTROSE 2-4 GM/100ML-% IV SOLN
2.0000 g | INTRAVENOUS | Status: DC
  Filled 2022-12-10: qty 100

## 2022-12-10 MED ORDER — HEPARIN 30,000 UNITS/1000 ML (OHS) CELLSAVER SOLUTION
Status: DC
  Filled 2022-12-10: qty 1000

## 2022-12-10 NOTE — Anesthesia Preprocedure Evaluation (Signed)
Anesthesia Evaluation  Patient identified by MRN, date of birth, ID band Patient awake    Reviewed: Allergy & Precautions, NPO status , Patient's Chart, lab work & pertinent test results, reviewed documented beta blocker date and time   History of Anesthesia Complications Negative for: history of anesthetic complications  Airway Mallampati: III  TM Distance: >3 FB Neck ROM: Full    Dental  (+) Dental Advisory Given   Pulmonary former smoker   Pulmonary exam normal        Cardiovascular hypertension, Pt. on medications and Pt. on home beta blockers + CAD  Normal cardiovascular exam+ Valvular Problems/Murmurs MR    '24 Carotid US  - 60-79% left ICAS, 1-39% right ICAS  '24 TTE - EF 70 to 75%. Mild left ventricular hypertrophy. Grade I diastolic dysfunction (impaired relaxation). RV mildly enlarged. Mild to moderate mitral valve regurgitation. Mild AI. There is borderline dilatation of the aortic root, measuring 39 mm.     Neuro/Psych  PSYCHIATRIC DISORDERS  Depression    negative neurological ROS     GI/Hepatic Neg liver ROS,GERD  Medicated and Controlled,,  Endo/Other   Obesity Pre-DM   Renal/GU negative Renal ROS     Musculoskeletal  (+) Arthritis ,    Abdominal   Peds  Hematology negative hematology ROS (+)   Anesthesia Other Findings   Reproductive/Obstetrics                             Anesthesia Physical Anesthesia Plan  ASA: 4  Anesthesia Plan: General   Post-op Pain Management:    Induction: Intravenous  PONV Risk Score and Plan: 2 and Treatment may vary due to age or medical condition  Airway Management Planned: Oral ETT  Additional Equipment: Arterial line, TEE, CVP and Ultrasound Guidance Line Placement  Intra-op Plan:   Post-operative Plan: Post-operative intubation/ventilation  Informed Consent: I have reviewed the patients History and Physical, chart, labs  and discussed the procedure including the risks, benefits and alternatives for the proposed anesthesia with the patient or authorized representative who has indicated his/her understanding and acceptance.     Dental advisory given  Plan Discussed with: CRNA and Anesthesiologist  Anesthesia Plan Comments:        Anesthesia Quick Evaluation

## 2022-12-11 ENCOUNTER — Inpatient Hospital Stay (HOSPITAL_COMMUNITY): Payer: Medicare Other | Admitting: Physician Assistant

## 2022-12-11 ENCOUNTER — Inpatient Hospital Stay (HOSPITAL_COMMUNITY): Payer: Medicare Other

## 2022-12-11 ENCOUNTER — Encounter (HOSPITAL_COMMUNITY): Payer: Self-pay | Admitting: Thoracic Surgery (Cardiothoracic Vascular Surgery)

## 2022-12-11 ENCOUNTER — Other Ambulatory Visit: Payer: Self-pay

## 2022-12-11 ENCOUNTER — Inpatient Hospital Stay (HOSPITAL_COMMUNITY)
Admission: RE | Disposition: A | Discharge status: Home / Self Care | Payer: Self-pay | Source: Ambulatory Visit | Attending: Thoracic Surgery (Cardiothoracic Vascular Surgery)

## 2022-12-11 ENCOUNTER — Inpatient Hospital Stay (HOSPITAL_COMMUNITY)
Admission: RE | Admit: 2022-12-11 | Discharge: 2022-12-18 | DRG: 236 | Disposition: A | Discharge status: Home / Self Care | Payer: Medicare Other | Attending: Thoracic Surgery (Cardiothoracic Vascular Surgery) | Admitting: Thoracic Surgery (Cardiothoracic Vascular Surgery)

## 2022-12-11 DIAGNOSIS — D62 Acute posthemorrhagic anemia: Secondary | ICD-10-CM | POA: Diagnosis not present

## 2022-12-11 DIAGNOSIS — Z96653 Presence of artificial knee joint, bilateral: Secondary | ICD-10-CM | POA: Diagnosis present

## 2022-12-11 DIAGNOSIS — I25118 Atherosclerotic heart disease of native coronary artery with other forms of angina pectoris: Secondary | ICD-10-CM | POA: Diagnosis not present

## 2022-12-11 DIAGNOSIS — Z79899 Other long term (current) drug therapy: Secondary | ICD-10-CM | POA: Diagnosis not present

## 2022-12-11 DIAGNOSIS — Z87891 Personal history of nicotine dependence: Secondary | ICD-10-CM

## 2022-12-11 DIAGNOSIS — I5032 Chronic diastolic (congestive) heart failure: Secondary | ICD-10-CM | POA: Diagnosis present

## 2022-12-11 DIAGNOSIS — R7303 Prediabetes: Secondary | ICD-10-CM | POA: Diagnosis present

## 2022-12-11 DIAGNOSIS — I110 Hypertensive heart disease with heart failure: Secondary | ICD-10-CM | POA: Diagnosis present

## 2022-12-11 DIAGNOSIS — R600 Localized edema: Secondary | ICD-10-CM | POA: Diagnosis present

## 2022-12-11 DIAGNOSIS — Z8042 Family history of malignant neoplasm of prostate: Secondary | ICD-10-CM

## 2022-12-11 DIAGNOSIS — D696 Thrombocytopenia, unspecified: Secondary | ICD-10-CM | POA: Diagnosis not present

## 2022-12-11 DIAGNOSIS — Z7982 Long term (current) use of aspirin: Secondary | ICD-10-CM | POA: Diagnosis not present

## 2022-12-11 DIAGNOSIS — Z1152 Encounter for screening for COVID-19: Secondary | ICD-10-CM | POA: Diagnosis not present

## 2022-12-11 DIAGNOSIS — J9601 Acute respiratory failure with hypoxia: Secondary | ICD-10-CM | POA: Diagnosis not present

## 2022-12-11 DIAGNOSIS — I2510 Atherosclerotic heart disease of native coronary artery without angina pectoris: Principal | ICD-10-CM | POA: Diagnosis present

## 2022-12-11 DIAGNOSIS — K219 Gastro-esophageal reflux disease without esophagitis: Secondary | ICD-10-CM | POA: Diagnosis present

## 2022-12-11 DIAGNOSIS — E669 Obesity, unspecified: Secondary | ICD-10-CM | POA: Diagnosis present

## 2022-12-11 DIAGNOSIS — E8770 Fluid overload, unspecified: Secondary | ICD-10-CM | POA: Diagnosis present

## 2022-12-11 DIAGNOSIS — Z6833 Body mass index (BMI) 33.0-33.9, adult: Secondary | ICD-10-CM

## 2022-12-11 DIAGNOSIS — Z791 Long term (current) use of non-steroidal anti-inflammatories (NSAID): Secondary | ICD-10-CM

## 2022-12-11 DIAGNOSIS — I340 Nonrheumatic mitral (valve) insufficiency: Secondary | ICD-10-CM | POA: Diagnosis present

## 2022-12-11 DIAGNOSIS — D72829 Elevated white blood cell count, unspecified: Secondary | ICD-10-CM | POA: Diagnosis not present

## 2022-12-11 DIAGNOSIS — Z951 Presence of aortocoronary bypass graft: Principal | ICD-10-CM

## 2022-12-11 DIAGNOSIS — R5381 Other malaise: Secondary | ICD-10-CM | POA: Diagnosis present

## 2022-12-11 DIAGNOSIS — I480 Paroxysmal atrial fibrillation: Secondary | ICD-10-CM | POA: Diagnosis present

## 2022-12-11 DIAGNOSIS — Z8249 Family history of ischemic heart disease and other diseases of the circulatory system: Secondary | ICD-10-CM

## 2022-12-11 DIAGNOSIS — J9600 Acute respiratory failure, unspecified whether with hypoxia or hypercapnia: Secondary | ICD-10-CM

## 2022-12-11 HISTORY — PX: CORONARY ARTERY BYPASS GRAFT: SHX141

## 2022-12-11 HISTORY — PX: TEE WITHOUT CARDIOVERSION: SHX5443

## 2022-12-11 LAB — POCT I-STAT, CHEM 8
BUN: 9 mg/dL (ref 8–23)
BUN: 9 mg/dL (ref 8–23)
BUN: 9 mg/dL (ref 8–23)
BUN: 8 mg/dL (ref 8–23)
BUN: 8 mg/dL (ref 8–23)
Calcium, Ion: 1.23 mmol/L (ref 1.15–1.40)
Calcium, Ion: 1.16 mmol/L (ref 1.15–1.40)
Calcium, Ion: 1.02 mmol/L — ABNORMAL LOW (ref 1.15–1.40)
Calcium, Ion: 1.01 mmol/L — ABNORMAL LOW (ref 1.15–1.40)
Calcium, Ion: 1.22 mmol/L (ref 1.15–1.40)
Chloride: 108 mmol/L (ref 98–111)
Chloride: 104 mmol/L (ref 98–111)
Chloride: 101 mmol/L (ref 98–111)
Chloride: 101 mmol/L (ref 98–111)
Chloride: 102 mmol/L (ref 98–111)
Creatinine, Ser: 0.8 mg/dL (ref 0.61–1.24)
Creatinine, Ser: 0.8 mg/dL (ref 0.61–1.24)
Creatinine, Ser: 0.7 mg/dL (ref 0.61–1.24)
Creatinine, Ser: 0.7 mg/dL (ref 0.61–1.24)
Creatinine, Ser: 0.7 mg/dL (ref 0.61–1.24)
Glucose, Bld: 106 mg/dL — ABNORMAL HIGH (ref 70–99)
Glucose, Bld: 116 mg/dL — ABNORMAL HIGH (ref 70–99)
Glucose, Bld: 112 mg/dL — ABNORMAL HIGH (ref 70–99)
Glucose, Bld: 115 mg/dL — ABNORMAL HIGH (ref 70–99)
Glucose, Bld: 132 mg/dL — ABNORMAL HIGH (ref 70–99)
HCT: 36 % — ABNORMAL LOW (ref 39.0–52.0)
HCT: 31 % — ABNORMAL LOW (ref 39.0–52.0)
HCT: 26 % — ABNORMAL LOW (ref 39.0–52.0)
HCT: 25 % — ABNORMAL LOW (ref 39.0–52.0)
HCT: 24 % — ABNORMAL LOW (ref 39.0–52.0)
Hemoglobin: 12.2 g/dL — ABNORMAL LOW (ref 13.0–17.0)
Hemoglobin: 10.5 g/dL — ABNORMAL LOW (ref 13.0–17.0)
Hemoglobin: 8.8 g/dL — ABNORMAL LOW (ref 13.0–17.0)
Hemoglobin: 8.5 g/dL — ABNORMAL LOW (ref 13.0–17.0)
Hemoglobin: 8.2 g/dL — ABNORMAL LOW (ref 13.0–17.0)
Potassium: 4.1 mmol/L (ref 3.5–5.1)
Potassium: 4 mmol/L (ref 3.5–5.1)
Potassium: 4.6 mmol/L (ref 3.5–5.1)
Potassium: 4.6 mmol/L (ref 3.5–5.1)
Potassium: 4.1 mmol/L (ref 3.5–5.1)
Sodium: 139 mmol/L (ref 135–145)
Sodium: 138 mmol/L (ref 135–145)
Sodium: 137 mmol/L (ref 135–145)
Sodium: 138 mmol/L (ref 135–145)
Sodium: 139 mmol/L (ref 135–145)
TCO2: 26 mmol/L (ref 22–32)
TCO2: 23 mmol/L (ref 22–32)
TCO2: 27 mmol/L (ref 22–32)
TCO2: 28 mmol/L (ref 22–32)
TCO2: 25 mmol/L (ref 22–32)

## 2022-12-11 LAB — HEMOGLOBIN AND HEMATOCRIT, BLOOD
HCT: 23.7 % — ABNORMAL LOW (ref 39.0–52.0)
Hemoglobin: 8.1 g/dL — ABNORMAL LOW (ref 13.0–17.0)

## 2022-12-11 LAB — GLUCOSE, CAPILLARY
Glucose-Capillary: 107 mg/dL — ABNORMAL HIGH (ref 70–99)
Glucose-Capillary: 114 mg/dL — ABNORMAL HIGH (ref 70–99)
Glucose-Capillary: 37 mg/dL — CL (ref 70–99)
Glucose-Capillary: 126 mg/dL — ABNORMAL HIGH (ref 70–99)
Glucose-Capillary: 101 mg/dL — ABNORMAL HIGH (ref 70–99)
Glucose-Capillary: 114 mg/dL — ABNORMAL HIGH (ref 70–99)
Glucose-Capillary: 118 mg/dL — ABNORMAL HIGH (ref 70–99)
Glucose-Capillary: 120 mg/dL — ABNORMAL HIGH (ref 70–99)
Glucose-Capillary: 122 mg/dL — ABNORMAL HIGH (ref 70–99)

## 2022-12-11 LAB — POCT I-STAT 7, (LYTES, BLD GAS, ICA,H+H)
Acid-Base Excess: 0 mmol/L (ref 0.0–2.0)
Acid-Base Excess: 1 mmol/L (ref 0.0–2.0)
Acid-base deficit: 2 mmol/L (ref 0.0–2.0)
Acid-base deficit: 1 mmol/L (ref 0.0–2.0)
Acid-base deficit: 1 mmol/L (ref 0.0–2.0)
Acid-base deficit: 5 mmol/L — ABNORMAL HIGH (ref 0.0–2.0)
Acid-base deficit: 4 mmol/L — ABNORMAL HIGH (ref 0.0–2.0)
Bicarbonate: 23.3 mmol/L (ref 20.0–28.0)
Bicarbonate: 23.7 mmol/L (ref 20.0–28.0)
Bicarbonate: 24.6 mmol/L (ref 20.0–28.0)
Bicarbonate: 23 mmol/L (ref 20.0–28.0)
Bicarbonate: 22.7 mmol/L (ref 20.0–28.0)
Bicarbonate: 19.4 mmol/L — ABNORMAL LOW (ref 20.0–28.0)
Bicarbonate: 20.4 mmol/L (ref 20.0–28.0)
Calcium, Ion: 1.2 mmol/L (ref 1.15–1.40)
Calcium, Ion: 0.92 mmol/L — ABNORMAL LOW (ref 1.15–1.40)
Calcium, Ion: 0.97 mmol/L — ABNORMAL LOW (ref 1.15–1.40)
Calcium, Ion: 1.22 mmol/L (ref 1.15–1.40)
Calcium, Ion: 1.18 mmol/L (ref 1.15–1.40)
Calcium, Ion: 1.12 mmol/L — ABNORMAL LOW (ref 1.15–1.40)
Calcium, Ion: 1.1 mmol/L — ABNORMAL LOW (ref 1.15–1.40)
HCT: 37 % — ABNORMAL LOW (ref 39.0–52.0)
HCT: 26 % — ABNORMAL LOW (ref 39.0–52.0)
HCT: 23 % — ABNORMAL LOW (ref 39.0–52.0)
HCT: 25 % — ABNORMAL LOW (ref 39.0–52.0)
HCT: 28 % — ABNORMAL LOW (ref 39.0–52.0)
HCT: 28 % — ABNORMAL LOW (ref 39.0–52.0)
HCT: 28 % — ABNORMAL LOW (ref 39.0–52.0)
Hemoglobin: 12.6 g/dL — ABNORMAL LOW (ref 13.0–17.0)
Hemoglobin: 8.8 g/dL — ABNORMAL LOW (ref 13.0–17.0)
Hemoglobin: 7.8 g/dL — ABNORMAL LOW (ref 13.0–17.0)
Hemoglobin: 8.5 g/dL — ABNORMAL LOW (ref 13.0–17.0)
Hemoglobin: 9.5 g/dL — ABNORMAL LOW (ref 13.0–17.0)
Hemoglobin: 9.5 g/dL — ABNORMAL LOW (ref 13.0–17.0)
Hemoglobin: 9.5 g/dL — ABNORMAL LOW (ref 13.0–17.0)
O2 Saturation: 100 %
O2 Saturation: 100 %
O2 Saturation: 100 %
O2 Saturation: 100 %
O2 Saturation: 99 %
O2 Saturation: 99 %
O2 Saturation: 98 %
Patient temperature: 35.9
Patient temperature: 99.6
Patient temperature: 38.3
Potassium: 4 mmol/L (ref 3.5–5.1)
Potassium: 4.3 mmol/L (ref 3.5–5.1)
Potassium: 4.6 mmol/L (ref 3.5–5.1)
Potassium: 4 mmol/L (ref 3.5–5.1)
Potassium: 3.9 mmol/L (ref 3.5–5.1)
Potassium: 4.2 mmol/L (ref 3.5–5.1)
Potassium: 4 mmol/L (ref 3.5–5.1)
Sodium: 138 mmol/L (ref 135–145)
Sodium: 137 mmol/L (ref 135–145)
Sodium: 137 mmol/L (ref 135–145)
Sodium: 139 mmol/L (ref 135–145)
Sodium: 138 mmol/L (ref 135–145)
Sodium: 139 mmol/L (ref 135–145)
Sodium: 139 mmol/L (ref 135–145)
TCO2: 24 mmol/L (ref 22–32)
TCO2: 25 mmol/L (ref 22–32)
TCO2: 26 mmol/L (ref 22–32)
TCO2: 24 mmol/L (ref 22–32)
TCO2: 24 mmol/L (ref 22–32)
TCO2: 20 mmol/L — ABNORMAL LOW (ref 22–32)
TCO2: 21 mmol/L — ABNORMAL LOW (ref 22–32)
pCO2 arterial: 40 mmHg (ref 32–48)
pCO2 arterial: 33 mmHg (ref 32–48)
pCO2 arterial: 35.4 mmHg (ref 32–48)
pCO2 arterial: 33.7 mmHg (ref 32–48)
pCO2 arterial: 33 mmHg (ref 32–48)
pCO2 arterial: 33.7 mmHg (ref 32–48)
pCO2 arterial: 34.4 mmHg (ref 32–48)
pH, Arterial: 7.373 (ref 7.35–7.45)
pH, Arterial: 7.464 — ABNORMAL HIGH (ref 7.35–7.45)
pH, Arterial: 7.449 (ref 7.35–7.45)
pH, Arterial: 7.442 (ref 7.35–7.45)
pH, Arterial: 7.44 (ref 7.35–7.45)
pH, Arterial: 7.371 (ref 7.35–7.45)
pH, Arterial: 7.386 (ref 7.35–7.45)
pO2, Arterial: 400 mmHg — ABNORMAL HIGH (ref 83–108)
pO2, Arterial: 456 mmHg — ABNORMAL HIGH (ref 83–108)
pO2, Arterial: 439 mmHg — ABNORMAL HIGH (ref 83–108)
pO2, Arterial: 296 mmHg — ABNORMAL HIGH (ref 83–108)
pO2, Arterial: 121 mmHg — ABNORMAL HIGH (ref 83–108)
pO2, Arterial: 134 mmHg — ABNORMAL HIGH (ref 83–108)
pO2, Arterial: 108 mmHg (ref 83–108)

## 2022-12-11 LAB — POCT I-STAT EG7
Acid-Base Excess: 1 mmol/L (ref 0.0–2.0)
Bicarbonate: 24.9 mmol/L (ref 20.0–28.0)
Calcium, Ion: 1.02 mmol/L — ABNORMAL LOW (ref 1.15–1.40)
HCT: 27 % — ABNORMAL LOW (ref 39.0–52.0)
Hemoglobin: 9.2 g/dL — ABNORMAL LOW (ref 13.0–17.0)
O2 Saturation: 83 %
Potassium: 4.1 mmol/L (ref 3.5–5.1)
Sodium: 137 mmol/L (ref 135–145)
TCO2: 26 mmol/L (ref 22–32)
pCO2, Ven: 34.1 mmHg — ABNORMAL LOW (ref 44–60)
pH, Ven: 7.472 — ABNORMAL HIGH (ref 7.25–7.43)
pO2, Ven: 44 mmHg (ref 32–45)

## 2022-12-11 LAB — PROTIME-INR
INR: 1.5 — ABNORMAL HIGH (ref 0.8–1.2)
Prothrombin Time: 18.3 s — ABNORMAL HIGH (ref 11.4–15.2)

## 2022-12-11 LAB — CBC
HCT: 26.9 % — ABNORMAL LOW (ref 39.0–52.0)
HCT: 28.8 % — ABNORMAL LOW (ref 39.0–52.0)
Hemoglobin: 9.3 g/dL — ABNORMAL LOW (ref 13.0–17.0)
Hemoglobin: 9.8 g/dL — ABNORMAL LOW (ref 13.0–17.0)
MCH: 35.9 pg — ABNORMAL HIGH (ref 26.0–34.0)
MCH: 35 pg — ABNORMAL HIGH (ref 26.0–34.0)
MCHC: 34.6 g/dL (ref 30.0–36.0)
MCHC: 34 g/dL (ref 30.0–36.0)
MCV: 103.9 fL — ABNORMAL HIGH (ref 80.0–100.0)
MCV: 102.9 fL — ABNORMAL HIGH (ref 80.0–100.0)
Platelets: 116 10*3/uL — ABNORMAL LOW (ref 150–400)
Platelets: 156 10*3/uL (ref 150–400)
RBC: 2.59 MIL/uL — ABNORMAL LOW (ref 4.22–5.81)
RBC: 2.8 MIL/uL — ABNORMAL LOW (ref 4.22–5.81)
RDW: 12.6 % (ref 11.5–15.5)
RDW: 12.5 % (ref 11.5–15.5)
WBC: 13.3 10*3/uL — ABNORMAL HIGH (ref 4.0–10.5)
WBC: 11.2 10*3/uL — ABNORMAL HIGH (ref 4.0–10.5)
nRBC: 0 % (ref 0.0–0.2)
nRBC: 0 % (ref 0.0–0.2)

## 2022-12-11 LAB — APTT: aPTT: 34 s (ref 24–36)

## 2022-12-11 LAB — BASIC METABOLIC PANEL WITH GFR
Anion gap: 11 (ref 5–15)
BUN: 8 mg/dL (ref 8–23)
CO2: 20 mmol/L — ABNORMAL LOW (ref 22–32)
Calcium: 7.7 mg/dL — ABNORMAL LOW (ref 8.9–10.3)
Chloride: 105 mmol/L (ref 98–111)
Creatinine, Ser: 0.83 mg/dL (ref 0.61–1.24)
GFR, Estimated: >60 mL/min (ref >60)
Glucose, Bld: 127 mg/dL — ABNORMAL HIGH (ref 70–99)
Potassium: 4.2 mmol/L (ref 3.5–5.1)
Sodium: 136 mmol/L (ref 135–145)

## 2022-12-11 LAB — PLATELET COUNT: Platelets: 128 10*3/uL — ABNORMAL LOW (ref 150–400)

## 2022-12-11 LAB — ECHO INTRAOPERATIVE TEE
AV Mean grad: 5 mmHg
AV Peak grad: 9.2 mmHg
Ao pk vel: 1.52 m/s
Height: 70 in
S' Lateral: 3.1 cm
Weight: 3680 [oz_av]

## 2022-12-11 LAB — MAGNESIUM: Magnesium: 2.6 mg/dL — ABNORMAL HIGH (ref 1.7–2.4)

## 2022-12-11 SURGERY — CORONARY ARTERY BYPASS GRAFTING (CABG)
Anesthesia: General | Site: Chest

## 2022-12-11 MED ORDER — DEXMEDETOMIDINE HCL IN NACL 400 MCG/100ML IV SOLN
INTRAVENOUS | Status: AC
  Filled 2022-12-11: qty 100

## 2022-12-11 MED ORDER — MAGNESIUM SULFATE 4 GM/100ML IV SOLN
4.0000 g | Freq: Once | INTRAVENOUS | Status: AC
  Administered 2022-12-11: 4 g via INTRAVENOUS
  Filled 2022-12-11: qty 100

## 2022-12-11 MED ORDER — SODIUM BICARBONATE 8.4 % IV SOLN
50.0000 meq | Freq: Once | INTRAVENOUS | Status: AC
  Administered 2022-12-11: 50 meq via INTRAVENOUS

## 2022-12-11 MED ORDER — MORPHINE SULFATE (PF) 2 MG/ML IV SOLN
1.0000 mg | INTRAVENOUS | Status: DC | PRN
  Administered 2022-12-11 (×3): 2 mg via INTRAVENOUS
  Administered 2022-12-11: 4 mg via INTRAVENOUS
  Administered 2022-12-11: 2 mg via INTRAVENOUS
  Administered 2022-12-12 (×5): 4 mg via INTRAVENOUS
  Administered 2022-12-13: 2 mg via INTRAVENOUS
  Administered 2022-12-13 (×3): 4 mg via INTRAVENOUS
  Filled 2022-12-11 (×4): qty 2
  Filled 2022-12-11 (×2): qty 1
  Filled 2022-12-11: qty 2
  Filled 2022-12-11 (×2): qty 1
  Filled 2022-12-11: qty 2
  Filled 2022-12-11 (×3): qty 1
  Filled 2022-12-11 (×2): qty 2

## 2022-12-11 MED ORDER — PANTOPRAZOLE SODIUM 40 MG IV SOLR
40.0000 mg | Freq: Every day | INTRAVENOUS | Status: AC
  Administered 2022-12-11 – 2022-12-12 (×2): 40 mg via INTRAVENOUS
  Filled 2022-12-11 (×2): qty 10

## 2022-12-11 MED ORDER — DEXMEDETOMIDINE HCL IN NACL 400 MCG/100ML IV SOLN
0.0000 ug/kg/h | INTRAVENOUS | Status: DC
  Administered 2022-12-12: 0.1 ug/kg/h via INTRAVENOUS
  Filled 2022-12-11: qty 100

## 2022-12-11 MED ORDER — FENTANYL CITRATE (PF) 250 MCG/5ML IJ SOLN
INTRAMUSCULAR | Status: DC | PRN
  Administered 2022-12-11 (×6): 100 ug via INTRAVENOUS
  Administered 2022-12-11 (×2): 50 ug via INTRAVENOUS
  Administered 2022-12-11: 100 ug via INTRAVENOUS
  Administered 2022-12-11: 150 ug via INTRAVENOUS
  Administered 2022-12-11 (×3): 100 ug via INTRAVENOUS

## 2022-12-11 MED ORDER — LACTATED RINGERS IV SOLN: INTRAVENOUS | Status: DC

## 2022-12-11 MED ORDER — PHENYLEPHRINE 80 MCG/ML (10ML) SYRINGE FOR IV PUSH (FOR BLOOD PRESSURE SUPPORT): PREFILLED_SYRINGE | INTRAVENOUS | Status: DC | PRN

## 2022-12-11 MED ORDER — ORAL CARE MOUTH RINSE: 15.0000 mL | Freq: Once | OROMUCOSAL | Status: AC

## 2022-12-11 MED ORDER — VANCOMYCIN HCL IN DEXTROSE 1-5 GM/200ML-% IV SOLN
1000.0000 mg | Freq: Once | INTRAVENOUS | Status: AC
  Administered 2022-12-11: 1000 mg via INTRAVENOUS
  Filled 2022-12-11: qty 200

## 2022-12-11 MED ORDER — PLASMA-LYTE A IV SOLN: INTRAVENOUS | Status: DC | PRN

## 2022-12-11 MED ORDER — ROCURONIUM BROMIDE 10 MG/ML (PF) SYRINGE
PREFILLED_SYRINGE | INTRAVENOUS | Status: AC
  Filled 2022-12-11: qty 20

## 2022-12-11 MED ORDER — METOPROLOL TARTRATE 12.5 MG HALF TABLET
12.5000 mg | ORAL_TABLET | Freq: Two times a day (BID) | ORAL | Status: DC
  Filled 2022-12-11: qty 1

## 2022-12-11 MED ORDER — ALBUMIN HUMAN 5 % IV SOLN
250.0000 mL | INTRAVENOUS | Status: DC | PRN
  Administered 2022-12-11 – 2022-12-12 (×2): 12.5 g via INTRAVENOUS
  Filled 2022-12-11: qty 250

## 2022-12-11 MED ORDER — MIRTAZAPINE 15 MG PO TABS
30.0000 mg | ORAL_TABLET | Freq: Every day | ORAL | Status: DC
  Administered 2022-12-12 – 2022-12-17 (×6): 30 mg via ORAL
  Filled 2022-12-11: qty 2
  Filled 2022-12-11: qty 1
  Filled 2022-12-11 (×3): qty 2
  Filled 2022-12-11: qty 1
  Filled 2022-12-11 (×2): qty 2
  Filled 2022-12-11 (×2): qty 1

## 2022-12-11 MED ORDER — ROCURONIUM BROMIDE 10 MG/ML (PF) SYRINGE
PREFILLED_SYRINGE | INTRAVENOUS | Status: DC | PRN
  Administered 2022-12-11: 50 mg via INTRAVENOUS
  Administered 2022-12-11: 100 mg via INTRAVENOUS
  Administered 2022-12-11 (×2): 50 mg via INTRAVENOUS

## 2022-12-11 MED ORDER — FENTANYL CITRATE (PF) 250 MCG/5ML IJ SOLN
INTRAMUSCULAR | Status: AC
  Filled 2022-12-11: qty 5

## 2022-12-11 MED ORDER — ASPIRIN 81 MG PO CHEW
324.0000 mg | CHEWABLE_TABLET | Freq: Once | ORAL | Status: DC
  Filled 2022-12-11: qty 4

## 2022-12-11 MED ORDER — MIDAZOLAM HCL 2 MG/2ML IJ SOLN
2.0000 mg | INTRAMUSCULAR | Status: DC | PRN
  Administered 2022-12-11: 2 mg via INTRAVENOUS
  Filled 2022-12-11: qty 2

## 2022-12-11 MED ORDER — PROTAMINE SULFATE 10 MG/ML IV SOLN
INTRAVENOUS | Status: AC
  Filled 2022-12-11: qty 15

## 2022-12-11 MED ORDER — SODIUM CHLORIDE 0.9% FLUSH
3.0000 mL | Freq: Two times a day (BID) | INTRAVENOUS | Status: DC
  Administered 2022-12-12 – 2022-12-15 (×7): 3 mL via INTRAVENOUS

## 2022-12-11 MED ORDER — OXYCODONE HCL 5 MG PO TABS: 5.0000 mg | ORAL_TABLET | ORAL | Status: DC | PRN

## 2022-12-11 MED ORDER — TRAMADOL HCL 50 MG PO TABS: 50.0000 mg | ORAL_TABLET | ORAL | Status: DC | PRN

## 2022-12-11 MED ORDER — METOPROLOL TARTRATE 25 MG/10 ML ORAL SUSPENSION: 12.5000 mg | Freq: Two times a day (BID) | ORAL | Status: DC

## 2022-12-11 MED ORDER — PHENYLEPHRINE 80 MCG/ML (10ML) SYRINGE FOR IV PUSH (FOR BLOOD PRESSURE SUPPORT)
PREFILLED_SYRINGE | INTRAVENOUS | Status: AC
  Filled 2022-12-11: qty 10

## 2022-12-11 MED ORDER — OXYCODONE HCL 5 MG PO TABS
5.0000 mg | ORAL_TABLET | ORAL | Status: DC | PRN
  Filled 2022-12-11: qty 1

## 2022-12-11 MED ORDER — MELATONIN 5 MG PO TABS: 10.0000 mg | ORAL_TABLET | Freq: Every day | ORAL | Status: DC

## 2022-12-11 MED ORDER — DOCUSATE SODIUM 100 MG PO CAPS: 200.0000 mg | ORAL_CAPSULE | Freq: Every day | ORAL | Status: DC

## 2022-12-11 MED ORDER — 0.9 % SODIUM CHLORIDE (POUR BTL) OPTIME
TOPICAL | Status: DC | PRN
Start: 2022-12-11 — End: 2022-12-11
  Administered 2022-12-11: 5000 mL

## 2022-12-11 MED ORDER — FERROUS SULFATE 325 (65 FE) MG PO TABS: 325.0000 mg | ORAL_TABLET | Freq: Every evening | ORAL | Status: DC

## 2022-12-11 MED ORDER — CHLORHEXIDINE GLUCONATE CLOTH 2 % EX PADS
6.0000 | MEDICATED_PAD | Freq: Every day | CUTANEOUS | Status: DC
  Administered 2022-12-12 – 2022-12-15 (×4): 6 via TOPICAL

## 2022-12-11 MED ORDER — ASPIRIN 325 MG PO TBEC
325.0000 mg | DELAYED_RELEASE_TABLET | Freq: Every day | ORAL | Status: DC
  Administered 2022-12-12 – 2022-12-14 (×3): 325 mg via ORAL
  Filled 2022-12-11 (×3): qty 1

## 2022-12-11 MED ORDER — POTASSIUM CHLORIDE 10 MEQ/50ML IV SOLN
10.0000 meq | INTRAVENOUS | Status: AC
  Administered 2022-12-11 (×3): 10 meq via INTRAVENOUS

## 2022-12-11 MED ORDER — ALBUMIN HUMAN 5 % IV SOLN
INTRAVENOUS | Status: DC | PRN
Start: 2022-12-11 — End: 2022-12-11

## 2022-12-11 MED ORDER — VASOPRESSIN 20 UNIT/ML IV SOLN
INTRAVENOUS | Status: AC
  Filled 2022-12-11: qty 1

## 2022-12-11 MED ORDER — PROTAMINE SULFATE 10 MG/ML IV SOLN
INTRAVENOUS | Status: DC | PRN
  Administered 2022-12-11: 420 mg via INTRAVENOUS

## 2022-12-11 MED ORDER — EPHEDRINE 5 MG/ML INJ
INTRAVENOUS | Status: AC
  Filled 2022-12-11: qty 5

## 2022-12-11 MED ORDER — PANTOPRAZOLE SODIUM 40 MG PO TBEC
40.0000 mg | DELAYED_RELEASE_TABLET | Freq: Every day | ORAL | Status: DC
  Administered 2022-12-13 – 2022-12-18 (×6): 40 mg via ORAL
  Filled 2022-12-11 (×6): qty 1

## 2022-12-11 MED ORDER — CHLORHEXIDINE GLUCONATE 0.12 % MT SOLN: 15.0000 mL | Freq: Once | OROMUCOSAL | Status: DC

## 2022-12-11 MED ORDER — CEFAZOLIN SODIUM-DEXTROSE 2-4 GM/100ML-% IV SOLN
2.0000 g | Freq: Three times a day (TID) | INTRAVENOUS | Status: AC
  Administered 2022-12-11 – 2022-12-13 (×5): 2 g via INTRAVENOUS
  Filled 2022-12-11 (×6): qty 100

## 2022-12-11 MED ORDER — HEPARIN SODIUM (PORCINE) 1000 UNIT/ML IJ SOLN
INTRAMUSCULAR | Status: AC
  Filled 2022-12-11: qty 1

## 2022-12-11 MED ORDER — SODIUM CHLORIDE 0.45 % IV SOLN: INTRAVENOUS | Status: DC | PRN

## 2022-12-11 MED ORDER — ASPIRIN 300 MG RE SUPP: 300.0000 mg | Freq: Once | RECTAL | Status: DC

## 2022-12-11 MED ORDER — BISACODYL 10 MG RE SUPP: 10.0000 mg | Freq: Every day | RECTAL | Status: DC

## 2022-12-11 MED ORDER — ACETAMINOPHEN 160 MG/5ML PO SOLN: 1000.0000 mg | Freq: Four times a day (QID) | ORAL | Status: DC

## 2022-12-11 MED ORDER — ROCURONIUM BROMIDE 10 MG/ML (PF) SYRINGE
PREFILLED_SYRINGE | INTRAVENOUS | Status: AC
  Filled 2022-12-11: qty 10

## 2022-12-11 MED ORDER — EPHEDRINE SULFATE-NACL 50-0.9 MG/10ML-% IV SOSY
PREFILLED_SYRINGE | INTRAVENOUS | Status: DC | PRN
  Administered 2022-12-11: 5 mg via INTRAVENOUS

## 2022-12-11 MED ORDER — HEPARIN SODIUM (PORCINE) 1000 UNIT/ML IJ SOLN
INTRAMUSCULAR | Status: AC
  Filled 2022-12-11: qty 10

## 2022-12-11 MED ORDER — ONDANSETRON HCL 4 MG/2ML IJ SOLN
4.0000 mg | Freq: Four times a day (QID) | INTRAMUSCULAR | Status: DC | PRN
  Administered 2022-12-18: 4 mg via INTRAVENOUS
  Filled 2022-12-11: qty 2

## 2022-12-11 MED ORDER — NICARDIPINE HCL IN NACL 20-0.86 MG/200ML-% IV SOLN: 0.0000 mg/h | INTRAVENOUS | Status: DC

## 2022-12-11 MED ORDER — METOPROLOL TARTRATE 12.5 MG HALF TABLET: 12.5000 mg | ORAL_TABLET | Freq: Once | ORAL | Status: DC

## 2022-12-11 MED ORDER — HEPARIN SODIUM (PORCINE) 1000 UNIT/ML IJ SOLN
INTRAMUSCULAR | Status: DC | PRN
  Administered 2022-12-11: 42000 [IU] via INTRAVENOUS

## 2022-12-11 MED ORDER — ACETAMINOPHEN 500 MG PO TABS
1000.0000 mg | ORAL_TABLET | Freq: Four times a day (QID) | ORAL | Status: AC
  Administered 2022-12-11 – 2022-12-16 (×19): 1000 mg via ORAL
  Filled 2022-12-11 (×18): qty 2

## 2022-12-11 MED ORDER — SODIUM CHLORIDE (PF) 0.9 % IJ SOLN
INTRAMUSCULAR | Status: AC
  Filled 2022-12-11: qty 30

## 2022-12-11 MED ORDER — LIDOCAINE 2% (20 MG/ML) 5 ML SYRINGE
INTRAMUSCULAR | Status: AC
  Filled 2022-12-11: qty 5

## 2022-12-11 MED ORDER — CHLORHEXIDINE GLUCONATE 0.12 % MT SOLN
15.0000 mL | OROMUCOSAL | Status: AC
  Administered 2022-12-11: 15 mL via OROMUCOSAL
  Filled 2022-12-11: qty 15

## 2022-12-11 MED ORDER — INSULIN REGULAR(HUMAN) IN NACL 100-0.9 UT/100ML-% IV SOLN: INTRAVENOUS | Status: DC

## 2022-12-11 MED ORDER — FERROUS SULFATE 300 (60 FE) MG/5ML PO SOLN
300.0000 mg | Freq: Every evening | ORAL | Status: DC
  Filled 2022-12-11: qty 5

## 2022-12-11 MED ORDER — METOPROLOL TARTRATE 5 MG/5ML IV SOLN
2.5000 mg | INTRAVENOUS | Status: DC | PRN
  Administered 2022-12-12: 2.5 mg via INTRAVENOUS
  Filled 2022-12-11: qty 5

## 2022-12-11 MED ORDER — CHLORHEXIDINE GLUCONATE 4 % EX SOLN: 30.0000 mL | CUTANEOUS | Status: DC

## 2022-12-11 MED ORDER — BISACODYL 5 MG PO TBEC
10.0000 mg | DELAYED_RELEASE_TABLET | Freq: Every day | ORAL | Status: DC
  Administered 2022-12-12 – 2022-12-13 (×2): 10 mg via ORAL
  Filled 2022-12-11 (×2): qty 2

## 2022-12-11 MED ORDER — ATORVASTATIN CALCIUM 10 MG PO TABS: 10.0000 mg | ORAL_TABLET | Freq: Every day | ORAL | Status: DC

## 2022-12-11 MED ORDER — LACTATED RINGERS IV SOLN: INTRAVENOUS | Status: DC | PRN

## 2022-12-11 MED ORDER — DEXTROSE 50 % IV SOLN: 0.0000 mL | INTRAVENOUS | Status: DC | PRN

## 2022-12-11 MED ORDER — CHLORHEXIDINE GLUCONATE 0.12 % MT SOLN
15.0000 mL | Freq: Once | OROMUCOSAL | Status: AC
  Administered 2022-12-11: 15 mL via OROMUCOSAL

## 2022-12-11 MED ORDER — ~~LOC~~ CARDIAC SURGERY, PATIENT & FAMILY EDUCATION
Freq: Once | Status: DC
  Filled 2022-12-11: qty 1

## 2022-12-11 MED ORDER — PROPOFOL 10 MG/ML IV BOLUS
INTRAVENOUS | Status: AC
  Filled 2022-12-11: qty 20

## 2022-12-11 MED ORDER — PROPOFOL 10 MG/ML IV BOLUS
INTRAVENOUS | Status: DC | PRN
  Administered 2022-12-11: 30 mg via INTRAVENOUS
  Administered 2022-12-11: 50 mg via INTRAVENOUS

## 2022-12-11 MED ORDER — SODIUM CHLORIDE 0.9 % IV SOLN: 250.0000 mL | INTRAVENOUS | Status: DC

## 2022-12-11 MED ORDER — LORATADINE 10 MG PO TABS: 10.0000 mg | ORAL_TABLET | Freq: Every day | ORAL | Status: DC

## 2022-12-11 MED ORDER — METOCLOPRAMIDE HCL 5 MG/ML IJ SOLN
10.0000 mg | Freq: Four times a day (QID) | INTRAMUSCULAR | Status: AC
  Administered 2022-12-11 – 2022-12-12 (×5): 10 mg via INTRAVENOUS
  Filled 2022-12-11 (×5): qty 2

## 2022-12-11 MED ORDER — SODIUM CHLORIDE 0.9% FLUSH: 3.0000 mL | INTRAVENOUS | Status: DC | PRN

## 2022-12-11 MED ORDER — MIDAZOLAM HCL (PF) 10 MG/2ML IJ SOLN
INTRAMUSCULAR | Status: AC
  Filled 2022-12-11: qty 2

## 2022-12-11 MED ORDER — ACETAMINOPHEN 160 MG/5ML PO SOLN
650.0000 mg | Freq: Once | ORAL | Status: DC
  Filled 2022-12-11: qty 20.3

## 2022-12-11 MED ORDER — SODIUM CHLORIDE 0.9 % IV SOLN: INTRAVENOUS | Status: DC

## 2022-12-11 MED ORDER — PHENYLEPHRINE 80 MCG/ML (10ML) SYRINGE FOR IV PUSH (FOR BLOOD PRESSURE SUPPORT)
PREFILLED_SYRINGE | INTRAVENOUS | Status: DC | PRN
  Administered 2022-12-11: 80 ug via INTRAVENOUS

## 2022-12-11 MED ORDER — PROTAMINE SULFATE 10 MG/ML IV SOLN
INTRAVENOUS | Status: AC
  Filled 2022-12-11: qty 25

## 2022-12-11 MED ORDER — TRANEXAMIC ACID-NACL 1000-0.7 MG/100ML-% IV SOLN
INTRAVENOUS | Status: AC
  Filled 2022-12-11: qty 100

## 2022-12-11 MED ORDER — MIDAZOLAM HCL (PF) 5 MG/ML IJ SOLN
INTRAMUSCULAR | Status: DC | PRN
  Administered 2022-12-11: 2 mg via INTRAVENOUS
  Administered 2022-12-11 (×2): 1 mg via INTRAVENOUS
  Administered 2022-12-11: 2 mg via INTRAVENOUS

## 2022-12-11 MED ORDER — ASPIRIN 81 MG PO CHEW: 324.0000 mg | CHEWABLE_TABLET | Freq: Every day | ORAL | Status: DC

## 2022-12-11 MED ORDER — NOREPINEPHRINE 4 MG/250ML-% IV SOLN
0.0000 ug/min | INTRAVENOUS | Status: DC
  Administered 2022-12-11: 2 ug/min via INTRAVENOUS
  Filled 2022-12-11: qty 250

## 2022-12-11 MED ORDER — DOCUSATE SODIUM 50 MG/5ML PO LIQD: 200.0000 mg | Freq: Every day | ORAL | Status: DC

## 2022-12-11 MED ORDER — SODIUM CHLORIDE (PF) 0.9 % IJ SOLN: OROMUCOSAL | Status: DC | PRN

## 2022-12-11 SURGICAL SUPPLY — 82 items
ADH SKN CLS APL DERMABOND .7 (GAUZE/BANDAGES/DRESSINGS) ×2
BAG DECANTER FOR FLEXI CONT (MISCELLANEOUS) ×2 IMPLANT
BLADE CLIPPER SURG (BLADE) ×2 IMPLANT
BLADE NDL 3 SS STRL (BLADE) IMPLANT
BLADE NEEDLE 3 SS STRL (BLADE) ×2 IMPLANT
BLADE STERNUM SYSTEM 6 (BLADE) ×2 IMPLANT
BLADE SURG 11 STRL SS (BLADE) IMPLANT
BNDG CMPR 5X4 KNIT ELC UNQ LF (GAUZE/BANDAGES/DRESSINGS) ×2
BNDG CMPR 6 X 5 YARDS HK CLSR (GAUZE/BANDAGES/DRESSINGS) ×2
BNDG ELASTIC 4INX 5YD STR LF (GAUZE/BANDAGES/DRESSINGS) IMPLANT
BNDG ELASTIC 6INX 5YD STR LF (GAUZE/BANDAGES/DRESSINGS) IMPLANT
BNDG GAUZE DERMACEA FLUFF 4 (GAUZE/BANDAGES/DRESSINGS) ×2 IMPLANT
BNDG GZE DERMACEA 4 6PLY (GAUZE/BANDAGES/DRESSINGS) ×2
CABLE SURGICAL S-101-97-12 (CABLE) ×2 IMPLANT
CANISTER SUCT 3000ML PPV (MISCELLANEOUS) ×2 IMPLANT
CANNULA MC2 2 STG 29/37 NON-V (CANNULA) ×2 IMPLANT
CANNULA NON VENT 20FR 12 (CANNULA) ×2 IMPLANT
CANNULA NON VENT 22FR 12 (CANNULA) IMPLANT
CANNULA VESSEL 3MM BLUNT TIP (CANNULA) IMPLANT
CATH ROBINSON RED A/P 18FR (CATHETERS) ×4 IMPLANT
CLIP TI MEDIUM 24 (CLIP) IMPLANT
CONN ST 1/2X1/2 BEN (MISCELLANEOUS) ×2 IMPLANT
CONNECTOR BLAKE 2:1 CARIO BLK (MISCELLANEOUS) ×2 IMPLANT
CONTAINER PROTECT SURGISLUSH (MISCELLANEOUS) ×4 IMPLANT
DERMABOND ADVANCED .7 DNX12 (GAUZE/BANDAGES/DRESSINGS) IMPLANT
DRAIN CHANNEL 19F RND (DRAIN) ×6 IMPLANT
DRAIN CONNECTOR BLAKE 1:1 (MISCELLANEOUS) ×2 IMPLANT
DRAPE CARDIOVASCULAR INCISE (DRAPES) ×2
DRAPE SRG 135X102X78XABS (DRAPES) ×2 IMPLANT
DRAPE WARM FLUID 44X44 (DRAPES) ×2 IMPLANT
DRSG AQUACEL AG ADV 3.5X10 (GAUZE/BANDAGES/DRESSINGS) ×2 IMPLANT
ELECT BLADE 4.0 EZ CLEAN MEGAD (MISCELLANEOUS) ×2
ELECT REM PT RETURN 9FT ADLT (ELECTROSURGICAL) ×4
ELECTRODE BLDE 4.0 EZ CLN MEGD (MISCELLANEOUS) ×2 IMPLANT
ELECTRODE REM PT RTRN 9FT ADLT (ELECTROSURGICAL) ×4 IMPLANT
FELT TEFLON 1X6 (MISCELLANEOUS) ×4 IMPLANT
GAUZE SPONGE 4X4 12PLY STRL (GAUZE/BANDAGES/DRESSINGS) ×4 IMPLANT
GLOVE BIO SURGEON STRL SZ 6 (GLOVE) IMPLANT
GLOVE BIO SURGEON STRL SZ7 (GLOVE) ×4 IMPLANT
GLOVE BIOGEL M STRL SZ7.5 (GLOVE) ×4 IMPLANT
GOWN STRL REUS W/ TWL LRG LVL3 (GOWN DISPOSABLE) ×8 IMPLANT
GOWN STRL REUS W/ TWL XL LVL3 (GOWN DISPOSABLE) ×4 IMPLANT
GOWN STRL REUS W/TWL LRG LVL3 (GOWN DISPOSABLE) ×8
GOWN STRL REUS W/TWL XL LVL3 (GOWN DISPOSABLE) ×4
HEMOSTAT POWDER SURGIFOAM 1G (HEMOSTASIS) ×4 IMPLANT
INSERT SUTURE HOLDER (MISCELLANEOUS) ×2 IMPLANT
KIT BASIN OR (CUSTOM PROCEDURE TRAY) ×2 IMPLANT
KIT SUCTION CATH 14FR (SUCTIONS) ×2 IMPLANT
KIT TURNOVER KIT B (KITS) ×2 IMPLANT
KIT VASOVIEW HEMOPRO 2 VH 4000 (KITS) ×2 IMPLANT
LEAD PACING MYOCARDI (MISCELLANEOUS) ×2 IMPLANT
MARKER GRAFT CORONARY BYPASS (MISCELLANEOUS) ×6 IMPLANT
NS IRRIG 1000ML POUR BTL (IV SOLUTION) ×10 IMPLANT
PACK E OPEN HEART (SUTURE) ×2 IMPLANT
PACK OPEN HEART (CUSTOM PROCEDURE TRAY) ×2 IMPLANT
PAD ARMBOARD 7.5X6 YLW CONV (MISCELLANEOUS) ×4 IMPLANT
PAD ELECT DEFIB RADIOL ZOLL (MISCELLANEOUS) ×2 IMPLANT
PENCIL BUTTON HOLSTER BLD 10FT (ELECTRODE) ×2 IMPLANT
POSITIONER HEAD DONUT 9IN (MISCELLANEOUS) ×2 IMPLANT
PUNCH AORTIC ROTATE 4.0MM (MISCELLANEOUS) ×2 IMPLANT
SET MICROPUNCTURE 5F STIFF (MISCELLANEOUS) IMPLANT
SET MPS 3-ND DEL (MISCELLANEOUS) IMPLANT
SUPPORT HEART JANKE-BARRON (MISCELLANEOUS) ×2 IMPLANT
SUT ETHIBOND X763 2 0 SH 1 (SUTURE) ×4 IMPLANT
SUT MNCRL AB 3-0 PS2 18 (SUTURE) ×4 IMPLANT
SUT MNCRL AB 4-0 PS2 18 (SUTURE) IMPLANT
SUT PDS AB 1 CTX 36 (SUTURE) ×4 IMPLANT
SUT PROLENE 4 0 SH DA (SUTURE) ×2 IMPLANT
SUT PROLENE 5 0 C 1 36 (SUTURE) ×6 IMPLANT
SUT PROLENE 7 0 BV1 MDA (SUTURE) ×2 IMPLANT
SUT VIC AB 2-0 CT1 36 (SUTURE) IMPLANT
SYSTEM SAHARA CHEST DRAIN ATS (WOUND CARE) ×2 IMPLANT
TAPE CLOTH SURG 4X10 WHT LF (GAUZE/BANDAGES/DRESSINGS) IMPLANT
TAPE PAPER 2X10 WHT MICROPORE (GAUZE/BANDAGES/DRESSINGS) IMPLANT
TOWEL GREEN STERILE (TOWEL DISPOSABLE) ×2 IMPLANT
TOWEL GREEN STERILE FF (TOWEL DISPOSABLE) ×2 IMPLANT
TRAY CATH LUMEN 1 20CM STRL (SET/KITS/TRAYS/PACK) IMPLANT
TRAY FOLEY SLVR 16FR TEMP STAT (SET/KITS/TRAYS/PACK) ×2 IMPLANT
TUBING ART PRESS 48 MALE/FEM (TUBING) IMPLANT
TUBING LAP HI FLOW INSUFFLATIO (TUBING) ×2 IMPLANT
UNDERPAD 30X36 HEAVY ABSORB (UNDERPADS AND DIAPERS) ×2 IMPLANT
WATER STERILE IRR 1000ML POUR (IV SOLUTION) ×4 IMPLANT

## 2022-12-11 NOTE — Anesthesia Procedure Notes (Signed)
Arterial Line Insertion Start/End9/18/2024 8:54 AM, 12/11/2022 8:58 AM Performed by: Beryle Lathe, MD, anesthesiologist  Patient location: OR. Preanesthetic checklist: patient identified, IV checked, risks and benefits discussed, surgical consent, monitors and equipment checked, pre-op evaluation, timeout performed and anesthesia consent Patient sedated Right, brachial was placed Catheter size: 20 G Hand hygiene performed   Attempts: 1 Procedure performed using ultrasound guided technique. Ultrasound Notes:anatomy identified, needle tip was noted to be adjacent to the nerve/plexus identified and no ultrasound evidence of intravascular and/or intraneural injection Following insertion, dressing applied and Biopatch. Post procedure assessment: unchanged and normal  Patient tolerated the procedure well with no immediate complications. Additional procedure comments: Arterial line placed in preop without waveform. Decision made to place new arterial line after induction. Radial artery on right appears occluded with clot. Brachial artery on right appears open. Rt brachial arterial line placed easily with [place] use. Picture not printed unfortunately.Marland Kitchen

## 2022-12-11 NOTE — Progress Notes (Signed)
Pt extubated at this time after 1 amp of sodium bicarb per verbal order by Leafy Ro, MD. Extubated to 4L [place]. NAD noted.

## 2022-12-11 NOTE — Progress Notes (Signed)
*  PRELIMINARY RESULTS*ogram Echocardiogram Transesophageal has been performed.  Janalyn Harder 12/11/2022, 9:33 AM

## 2022-12-11 NOTE — Anesthesia Procedure Notes (Addendum)
Central Venous Catheter Insertion Performed by: Beryle Lathe, MD, anesthesiologist Start/End9/18/2024 7:58 AM, 12/11/2022 8:10 AM Preanesthetic checklist: patient identified, IV checked, risks and benefits discussed, surgical consent, monitors and equipment checked, pre-op evaluation, timeout performed and anesthesia consent Position: Trendelenburg Lidocaine 1% used for infiltration and patient sedated Hand hygiene performed , maximum sterile barriers used  and Seldinger technique used Catheter size: 8.5 Fr Central line was placed.Sheath introducer Procedure performed using ultrasound guided technique. Ultrasound Notes:anatomy identified, needle tip was noted to be adjacent to the nerve/plexus identified, no ultrasound evidence of intravascular and/or intraneural injection and image(s) printed for medical record Attempts: 1 Following insertion, line sutured, dressing applied and Biopatch. Post procedure assessment: blood return through all ports, free fluid flow and no air  Patient tolerated the procedure well with no immediate complications. Additional procedure comments: Triple lumen SLIC placed via [facility].

## 2022-12-11 NOTE — Progress Notes (Signed)
Weaning process started.

## 2022-12-11 NOTE — Interval H&P Note (Signed)
History and Physical Interval Note:  12/11/2022 8:24 AM  Erik [name]  has presented today for surgery, with the diagnosis of CAD.  The various methods of treatment have been discussed with the patient and family. After consideration of risks, benefits and other options for treatment, the patient has consented to  Procedure(s): CORONARY ARTERY BYPASS GRAFTING (CABG) (N/A) TRANSESOPHAGEAL ECHOCARDIOGRAM (N/A) as a surgical intervention.  The patient's history has been reviewed, patient examined, no change in status, stable for surgery.  I have reviewed the patient's chart and labs.  Questions were answered to the patient's satisfaction.     Wally Behan Keane Scrape

## 2022-12-11 NOTE — Anesthesia Procedure Notes (Addendum)
Arterial Line Insertion Start/End9/18/2024 8:00 AM, 12/11/2022 8:30 AM Performed by: Beryle Lathe, MD, anesthesiologist  Patient location: Pre-op. Preanesthetic checklist: patient identified, IV checked, risks and benefits discussed, surgical consent, monitors and equipment checked, pre-op evaluation, timeout performed and anesthesia consent Lidocaine 1% used for infiltration and patient sedated Left, radial was placed Catheter size: 20 G Hand hygiene performed   Attempts: 3 (Previous attempts by SRNA/CRNA unsuccessful) Procedure performed using ultrasound guided technique. Ultrasound Notes:anatomy identified, needle tip was noted to be adjacent to the nerve/plexus identified, no ultrasound evidence of intravascular and/or intraneural injection and image(s) printed for medical record Following insertion, dressing applied and Biopatch. Post procedure assessment: unchanged and normal  Patient tolerated the procedure well with no immediate complications.

## 2022-12-11 NOTE — Progress Notes (Signed)
Extubation Procedure Note  Patient Details:   Name: Erik [name] DOB: October 30, 1945 MRN: [id number]   Airway Documentation:    Vent end date: 12/11/22 Vent end time: 2125   Evaluation O2 sats: stable throughout VC through ventilator 1.0L NIF -20 Pt awake and able to follow commands and lift head Leak audible around ETT Strong cough HCO3 given Complications: No apparent complications Patient did tolerate procedure well. Bilateral Breath Sounds: Clear, Diminished   Yes  Rutha Bouchard 12/11/2022, 11:48 PM

## 2022-12-11 NOTE — Anesthesia Procedure Notes (Signed)
Procedure Name: Intubation Date/Time: 12/11/2022 8:52 AM  Performed by: Alease Medina, CRNAPre-anesthesia Checklist: Patient identified, Emergency Drugs available, Suction available and Patient being monitored Patient Re-evaluated:Patient Re-evaluated prior to induction Oxygen Delivery Method: Circle system utilized Preoxygenation: Pre-oxygenation with 100% oxygen Induction Type: IV induction Ventilation: Mask ventilation without difficulty Laryngoscope Size: Mac and 4 Grade View: Grade I Tube type: Oral Tube size: 8.0 mm Number of attempts: 1 Airway Equipment and Method: Stylet and Oral airway Placement Confirmation: ETT inserted through vocal cords under direct vision, positive ETCO2 and breath sounds checked- equal and bilateral Secured at: 24 cm Tube secured with: Tape Dental Injury: Teeth and Oropharynx as per pre-operative assessment

## 2022-12-11 NOTE — Discharge Instructions (Addendum)

## 2022-12-11 NOTE — Consult Note (Signed)
NAME:  Erik [name], MRN:  [id number], DOB:  1945-06-05, LOS: 0 ADMISSION DATE:  12/11/2022, CONSULTATION DATE:  9/18 REFERRING MD:  Dr. Cliffton Asters, CHIEF COMPLAINT:  CABG x 4  History of Present Illness:  Patient is a 77 yo M w/ pertinent PMH 3V CAD, HTN presents to [facility] on 9/18 for CABG.  Patient had heart cath in April 2017 showing focal lesion in circumflex and total chronic occlusion of RCA w/ left to right collaterals. Patient treated medically at that time. August 2024 patient seen by cards for chest tightness and pain worse w/ activity which improves w/ rest and nitroglycerin. Repeat Cath showing 3 vessel CAD. Echo LVEF 70-75%; Grade I diastolic dysfunction; mild-moderate mitral valve regurgitation. TCTS consulted. Scheduled for CABG.  On 9/18 patient post CABG x 4. PCCM consulted.  Pertinent  Medical History   Past Medical History:  Diagnosis Date   Arthritis    Coronary artery disease    GERD (gastroesophageal reflux disease)    History of echocardiogram    Echo 4/17 - mild concentric LVH, EF 55-60%, no RWMA, Gr 1 DD, mild AI, mild MR, mild LAE   Hypertension    Kidney stone    Obesity    Tinea corporis      Significant Hospital Events: Including procedures, antibiotic start and stop dates in addition to other pertinent events   9/18 CABG x 4  Interim History / Subjective:  Post op intubated/sedated  Objective   Blood pressure 117/71, pulse (!) 54, temperature 97.7 F (36.5 C), temperature source Oral, resp. rate 18, height 5\' 10"  (1.778 m), weight 104.3 kg, SpO2 100%.        Intake/Output Summary (Last 24 hours) at 12/11/2022 1157 Last data filed at 12/11/2022 1132 Gross per 24 hour  Intake 1400 ml  Output 240 ml  Net 1160 ml   Filed Weights   12/11/22 0653  Weight: 104.3 kg    Examination: General: critically ill appearing on mech vent HEENT: MM pink/moist; ETT in place Neuro: sedate CV: s1s2, rate 50s; CT in place PULM:  dim BS bilaterally; on mech  vent simv GI: soft, bsx4 active  Extremities: warm/dry, no edema  Skin: no rashes or lesions   Resolved Hospital Problem list     Assessment & Plan:  Postoperative vent management Plan: - CXR to confirm ETT and CT position - Continue full vent support (4-8cc/kg IBW) - Wean FiO2 for O2 sat > 90% - Daily WUA/SBT, rapid wean with SIMV per protocol - VAP bundle - Pulmonary hygiene - F/u ABG - PAD protocol for sedation: Precedex for goal RASS 0 to -1  S/p 4-vessel CABG CAD HTN Grade 1 diastolic CHF Plan: - switch neo to levo for map >65 - albumin per protocol - Continue ASA and statin - wean insulin gtt per protocol - Postoperative care per TCTS - CT management per protocol  Best Practice (right click and "Reselect all SmartList Selections" daily)   Diet/type: NPO w/ meds via tube DVT prophylaxis: SCD GI prophylaxis: PPI Lines: Central line and Arterial Line Foley:  Yes, and it is still needed Code Status:  full code Last date of multidisciplinary goals of care discussion [per primary]  Labs   CBC: Recent Labs  Lab 12/09/22 1200 12/11/22 0907 12/11/22 0913 12/11/22 1109 12/11/22 1137 12/11/22 1144  WBC 10.1  --   --   --   --   --   HGB 14.5 12.2* 12.6* 10.5* 8.8* 9.2*  HCT 41.5 36.0*  37.0* 31.0* 26.0* 27.0*  MCV 101.2*  --   --   --   --   --   PLT 228  --   --   --   --   --     Basic Metabolic Panel: Recent Labs  Lab 12/09/22 1200 12/11/22 0907 12/11/22 0913 12/11/22 1109 12/11/22 1137 12/11/22 1144  NA 136 139 138 138 137 137  K 4.9 4.1 4.0 4.0 4.3 4.1  CL 105 108  --  104  --   --   CO2 21*  --   --   --   --   --   GLUCOSE 101* 106*  --  116*  --   --   BUN 7* 9  --  9  --   --   CREATININE 0.98 0.80  --  0.80  --   --   CALCIUM 8.8*  --   --   --   --   --    GFR: Estimated Creatinine Clearance: 93.5 mL/min (by C-G formula based on SCr of 0.8 mg/dL). Recent Labs  Lab 12/09/22 1200  WBC 10.1    Liver Function Tests: Recent Labs   Lab 12/09/22 1200  AST 44*  ALT 31  ALKPHOS 57  BILITOT 0.8  PROT 6.4*  ALBUMIN 3.8   No results for input(s): "LIPASE", "AMYLASE" in the last 168 hours. No results for input(s): "AMMONIA" in the last 168 hours.  ABG    Component Value Date/Time   PHART 7.464 (H) 12/11/2022 1137   PCO2ART 33.0 12/11/2022 1137   PO2ART 456 (H) 12/11/2022 1137   HCO3 24.9 12/11/2022 1144   TCO2 26 12/11/2022 1144   ACIDBASEDEF 2.0 12/11/2022 0913   O2SAT 83 12/11/2022 1144     Coagulation Profile: Recent Labs  Lab 12/09/22 1200  INR 1.0    Cardiac Enzymes: No results for input(s): "CKTOTAL", "CKMB", "CKMBINDEX", "TROPONINI" in the last 168 hours.  HbA1C: Hgb A1c MFr Bld  Date/Time Value Ref Range Status  12/09/2022 11:41 AM 4.9 4.8 - 5.6 % Final    Comment:    (NOTE) Pre diabetes:          5.7%-6.4%  Diabetes:              >6.4%  Glycemic control for   <7.0% adults with diabetes     CBG: No results for input(s): "GLUCAP" in the last 168 hours.  Review of Systems:   Patient is intubated; therefore, history has been obtained from chart review.    Past Medical History:  He,  has a past medical history of Arthritis, Coronary artery disease, GERD (gastroesophageal reflux disease), History of echocardiogram, Hypertension, Kidney stone, Obesity, and Tinea corporis.   Surgical History:   Past Surgical History:  Procedure Laterality Date   CARDIAC CATHETERIZATION N/A 07/13/2015   Procedure: Left Heart Cath and Coronary Angiography;  Surgeon: Corky Crafts, MD;  Location: [facility] INVASIVE CV LAB;  Service: Cardiovascular;  Laterality: N/A;   CATARACT EXTRACTION Bilateral    LEFT HEART CATH AND CORONARY ANGIOGRAPHY N/A 11/20/2022   Procedure: LEFT HEART CATH AND CORONARY ANGIOGRAPHY;  Surgeon: Corky Crafts, MD;  Location: [facility] INVASIVE CV LAB;  Service: Cardiovascular;  Laterality: N/A;   TONSILLECTOMY     removed as a child   TOTAL KNEE ARTHROPLASTY Bilateral       Social History:   reports that he quit smoking about 40 years ago. His smoking use included cigarettes. He has  never used smokeless tobacco. He reports current alcohol use of about 7.0 standard drinks of alcohol per week. He reports that he does not currently use drugs.   Family History:  His family history includes Heart disease in his mother; Prostate cancer in his father. There is no history of Heart attack, Stroke, or Hypertension.   Allergies No Known Allergies   Home Medications  Prior to Admission medications   Medication Sig Start Date End Date Taking? Authorizing Provider  Ascorbic Acid (VITAMIN C) 500 MG CAPS Take 500 mg by mouth every evening. Chewable   Yes [provider]  aspirin 81 MG tablet Take 81 mg by mouth.   Yes [provider]  atorvastatin (LIPITOR) 10 MG tablet TAKE 1 TABLET BY MOUTH EVERY DAY 04/23/22  Yes Corky Crafts, MD  b complex vitamins capsule Take 1 capsule by mouth daily.   Yes [provider]  Calcium Carb-Cholecalciferol (CALCIUM 600 + D PO) Take 1 tablet by mouth every evening.   Yes [provider]  Cholecalciferol (VITAMIN D3) 5000 units CAPS Take 5,000 Units by mouth every morning.   Yes [provider]  ferrous sulfate 325 (65 FE) MG tablet Take 325 mg by mouth every evening.   Yes [provider]  fexofenadine (ALLEGRA) 180 MG tablet Take 180 mg by mouth daily.   Yes [provider]  Flaxseed, Linseed, (FLAXSEED OIL) 1000 MG CAPS Take 1,000 mg by mouth daily.   Yes [provider]  folic acid (FOLVITE) 800 MCG tablet Take 800 mcg by mouth daily.   Yes [provider]  HYDROcodone-acetaminophen (NORCO) 10-325 MG tablet Take 1 tablet by mouth 5 (five) times daily as needed for moderate pain. 06/02/20  Yes [provider]  isosorbide mononitrate (IMDUR) 60 MG 24 hr tablet TAKE 1 AND 1/2 TABLETS BY MOUTH ONCE DAILY 04/22/22  Yes Corky Crafts, MD   Magnesium 125 MG CAPS Take 125 mg by mouth at bedtime.   Yes [provider]  Melatonin 10 MG CAPS Take 10 mg by mouth at bedtime.   Yes [provider]  meloxicam (MOBIC) 15 MG tablet Take 15 mg by mouth daily. 12/08/14  Yes [provider]  methocarbamol (ROBAXIN) 500 MG tablet Take 500 mg by mouth daily as needed for muscle spasms.   Yes [provider]  metoprolol tartrate (LOPRESSOR) 25 MG tablet TAKE 1 TABLET BY MOUTH TWICE A DAY 04/05/22  Yes Corky Crafts, MD  mirtazapine (REMERON) 30 MG tablet Take 30 mg by mouth at bedtime. 01/27/21  Yes [provider]  Multiple Vitamin (MULITIVITAMIN WITH MINERALS) TABS Take 1 tablet by mouth daily. Men   Yes [provider]  nitroGLYCERIN (NITROSTAT) 0.4 MG SL tablet DISSOLVE 1 TABLET UNDER TONGUE EVERY 5 MINUTES AS NEEDED FOR CHEST PAIN 08/07/21  Yes Corky Crafts, MD  OVER THE COUNTER MEDICATION Take 1 tablet by mouth daily. Unforgettable memory supplement   Yes [provider]  pantoprazole (PROTONIX) 40 MG tablet Take 1 tablet (40 mg total) by mouth daily. Patient taking differently: Take 40 mg by mouth at bedtime. 11/15/22  Yes Corky Crafts, MD  ramipril (ALTACE) 5 MG capsule TAKE 1 CAPSULE BY MOUTH EVERY DAY 10/21/22  Yes Corky Crafts, MD  ranolazine (RANEXA) 500 MG 12 hr tablet Take 1 tablet (500 mg total) by mouth 2 (two) times daily. 11/05/22  Yes Corky Crafts, MD  Saw Palmetto 450 MG CAPS Take 450  mg by mouth every morning.   Yes [provider]  Selenium 200 MCG CAPS Take 200 mcg by mouth every morning.   Yes [provider]  vitamin B-12 (CYANOCOBALAMIN) 500 MCG tablet Take 500 mcg by mouth daily.   Yes [provider]  vitamin E 400 UNIT capsule Take 400 Units by mouth daily.   Yes [provider]  furosemide (LASIX) 40 MG tablet Take 40 mg by mouth daily as needed (Swelling).    [provider]      Critical care time: 45 minutes    JD Anselm Lis [facility] 12/11/2022, 11:57 AM  Please see [email or web address] for pager details.  From 7A-7P if no response, please call [phone]. After hours, please call ELink [phone].

## 2022-12-11 NOTE — Anesthesia Postprocedure Evaluation (Signed)
Anesthesia Post Note  Patient: Erik [name]  Procedure(s) Performed: CORONARY ARTERY BYPASS GRAFTING (CABG)TIMES FOUR UTILIZING LEFT INTERNAL MAMMARY ARTERY AND ENDOSCOPIC VEIN HARVEST RIGHT GREATER SAPHENOUS VEIN (Chest) TRANSESOPHAGEAL ECHOCARDIOGRAM     Patient location during evaluation: ICU Anesthesia Type: General Level of consciousness: sedated and patient remains intubated per anesthesia plan Pain management: pain level controlled Vital Signs Assessment: post-procedure vital signs reviewed and stable Respiratory status: patient remains intubated per anesthesia plan Cardiovascular status: stable Postop Assessment: no apparent nausea or vomiting Anesthetic complications: no   No notable events documented.  Last Vitals:  Vitals:   12/11/22 0818 12/11/22 1420  BP:  134/86  Pulse: (!) 54 (!) 57  Resp: 18 16  Temp:    SpO2: 100% 100%    Last Pain:  Vitals:   12/11/22 0653  TempSrc: Oral  PainSc: 0-No pain                 Beryle Lathe

## 2022-12-11 NOTE — Op Note (Signed)
[address]       Erik [name] [address]             [phone]                                          12/11/2022 Patient:  Erik [name] Pre-Op Dx: 3V CAD HTN Obesity   Post-op Dx:  same Procedure: CABG X 4.  LIMA LAD, RSVG PLV, OM, diagonal   Endoscopic greater saphenous vein harvest on the right   Surgeon and Role:      * Jessamine Barcia, Eliezer Lofts, MD - Primary    * Jacques Earthly , PA-C - assisting An experienced assistant was required given the complexity of this surgery and the standard of surgical care. The assistant was needed for exposure, dissection, suctioning, retraction of delicate tissues and sutures, instrument exchange and for overall help during this procedure.    Anesthesia  general EBL:  Blood Administration: none Xclamp Time:  60 min Pump Time:   Drains: 19 F blake drain: L, mediastinal  Wires: ventricular Counts: correct   Indications: 77yo male with 3V CAD.  He has preserved biventricular function, and mild to moderate mitral valve regurgitation.  On review of his echo, the MR appears central.  The risks and benefits of CABG were discussed in detail.  The patient is agreeable to proceed.  He also is followed in a pain clinic, and I instructed him to inform them that he is about to have surgery.    Findings: Large LIMA.  Varicose vein.  Good PLV, intramyocardial OM, small diagonal, good LIMA.  Operative Technique: All invasive lines were placed in pre-op holding.  After the risks, benefits and alternatives were thoroughly discussed, the patient was brought to the operative theatre.  Anesthesia was induced, and the patient was prepped and draped in normal sterile fashion.  An appropriate surgical pause was performed, and pre-operative antibiotics were dosed accordingly.  We began with simultaneous incisions along the right leg for harvesting of the greater saphenous vein and the chest for the sternotomy.  In regards to the  sternotomy, this was carried down with bovie cautery, and the sternum was divided with a reciprocating saw.  Meticulous hemostasis was obtained.  The left internal thoracic artery was exposed and harvested in in pedicled fashion.  The patient was systemically heparinized, and the artery was divided distally, and placed in a papaverine sponge.    The sternal elevator was removed, and a retractor was placed.  The pericardium was divided in the midline and fashioned into a cradle with pericardial stitches.   After we confirmed an appropriate ACT, the ascending aorta was cannulated in standard fashion.  The right atrial appendage was used for venous cannulation site.  Cardiopulmonary bypass was initiated, and the heart retractor was placed. The cross clamp was applied, and a dose of anterograde cardioplegia was given with good arrest of the heart.  We moved to the posterior wall of the heart, and found a good target on the PLV.  An arteriotomy was made, and the vein graft was anastomosed to it in an end to side fashion.  Next we exposed the lateral wall, and found a good target on the OM.  An end to side anastomosis with the vein graft was then created.  Next, we exposed the anterior wall of the  heart and identified a good target on Diagonal.   An arteriotomy was created.  The vein was anastomosed in an end to side fashion.  Finally, we exposed a good target on the  LAD, and fashioned an end to side anastomosis between it and the LITA.  We began to re-warm, and a re-animation dose of cardioplegia was given.  The heart was de-aired, and the cross clamp was removed.  Meticulous hemostasis was obtained.    A partial occludding clamp was then placed on the ascending aorta, and we created an end to side anastomosis between it and the proximal vein grafts.  Rings were placed on the proximal anastomosis.  Hemostasis was obtained, and we separated from cardiopulmonary bypass without event.  The heparin was reversed with  protamine.  Chest tubes and wires were placed, and the sternum was re-approximated with sternal wires.  The soft tissue and skin were re-approximated wth absorbable suture.    The patient tolerated the procedure without any immediate complications, and was transferred to the ICU in guarded condition.  Erik [name]

## 2022-12-11 NOTE — Brief Op Note (Signed)
12/11/2022  12:47 PM  PATIENT:  Erik [name]  77 y.o. male  PRE-OPERATIVE DIAGNOSIS:  CORONARY ARTERY DISEASE  POST-OPERATIVE DIAGNOSIS:  CORONARY ARTERY DISEASE  PROCEDURE: TRANSESOPHAGEAL ECHOCARDIOGRAM,  CORONARY ARTERY BYPASS GRAFTING (CABG) TIMES FOUR (LIMA to LAD, SVG to DIAGONAL, SVG to OM, SVG to PDA) UTILIZING LEFT INTERNAL MAMMARY ARTERY AND ENDOSCOPIC VEIN HARVEST of RIGHT GREATER SAPHENOUS VEIN   Vein harvest time: 60 min Vein prep time: 20 min  SURGEON:  Surgeons and Role:    Corliss Skains, MD - Primary  PHYSICIAN ASSISTANT: Doree Fudge PA-C  ASSISTANTS: Virgilio Frees RNFA   ANESTHESIA:   general  EBL:  100 mL   DRAINS:  Chest tubes placed in the mediastinal and pleural spaces    COUNTS CORRECT:  YES  DICTATION: .Dragon Dictation  PLAN OF CARE: Admit to inpatient   PATIENT DISPOSITION:  ICU - intubated and hemodynamically stable.   Delay start of Pharmacological VTE agent (>24hrs) due to surgical blood loss or risk of bleeding: no  BASELINE WEIGHT: 104.3 kg

## 2022-12-11 NOTE — Hospital Course (Addendum)
HPI: This is a 77 year old male with known coronary artery disease and hypertension who has had progressive anginal symptoms. He was referred to Dr. Cliffton Asters and seen in the office on 12/08/2022 for the consideration of coronary artery bypass grafting surgery. Of note, echo showed LVEF 70-75% and mild to moderate mitral valve regurgitation. Dr. Cliffton Asters discussed the need for CABG. Potential risks, benefits, and complications of coronary artery bypass grafting surgery. Pre operative carotid duplex US showed no significant right internal carotid artery stenosis and a 60-79% left ICA stenosis. Patient agreeable to proceed with surgery.  Hospital Course: Patient underwent a CABG x 4 and was transported in stable condition from the OR to [facility] ICU in. He was extubated late the evening of surgery. Femoral a line was removed on post op day one. Foley and chest tubes were removed the following day. He was started on Lopressor. He was volume overloaded and diuresed accordingly. He was transitioned off the Insulin drip. His pre op HGA1C was 4.9. Accu checks and SS PRN will be stopped after transfer. He had expected post op blood loss anemia. Of note, he was on oral iron prior to surgery and this was restarted. He did not require a post op transfusion. He had thrombocytopenia. This did resolve as platelets were up to 177,000. He went into a fib the morning of post op day 2. He was put an Amiodarone drip. He had PAF, rate mostly controlled. He was started on Apixaban on 09/22.He was stable for transfer from the ICU to 4E for further convalescence on 09/22. He has been tolerating a diet and has had a bowel movement. All wounds are clean, dry, healing without signs of infection. He has been ambulating on room air with good oxygenation.

## 2022-12-11 NOTE — Transfer of Care (Signed)
Immediate Anesthesia Transfer of Care Note  Patient: Erik [name]  Procedure(s) Performed: CORONARY ARTERY BYPASS GRAFTING (CABG)TIMES FOUR UTILIZING LEFT INTERNAL MAMMARY ARTERY AND ENDOSCOPIC VEIN HARVEST RIGHT GREATER SAPHENOUS VEIN (Chest) TRANSESOPHAGEAL ECHOCARDIOGRAM  Patient Location: ICU  Anesthesia Type:General  Level of Consciousness: sedated and Patient remains intubated per anesthesia plan  Airway & Oxygen Therapy: Patient remains intubated per anesthesia plan and Patient placed on Ventilator (see vital sign flow sheet for setting)  Post-op Assessment: Report given to RN and Post -op Vital signs reviewed and stable  Post vital signs: Reviewed and stable  Last Vitals:  Vitals Value Taken Time  BP 95/50   Temp 36.1 C 12/11/22 1427  Pulse 59 12/11/22 1427  Resp 16 12/11/22 1427  SpO2 100 % 12/11/22 1427  Vitals shown include unfiled device data.  Last Pain:  Vitals:   12/11/22 0653  TempSrc: Oral  PainSc: 0-No pain      Patients Stated Pain Goal: 0 (12/11/22 1610)  Complications: No notable events documented.  Patient transported to ICU with standard monitors (HR, BP, SPO2, RR) and emergency drugs/equipment. Controlled ventilation maintained via ambu bag. Report given to bedside RN and respiratory therapist. Pt connected to ICU monitor and ventilator. All questions answered and vital signs stable before leaving

## 2022-12-12 ENCOUNTER — Inpatient Hospital Stay (HOSPITAL_COMMUNITY): Payer: Medicare Other

## 2022-12-12 ENCOUNTER — Encounter (HOSPITAL_COMMUNITY): Payer: Self-pay | Admitting: Thoracic Surgery (Cardiothoracic Vascular Surgery)

## 2022-12-12 DIAGNOSIS — Z951 Presence of aortocoronary bypass graft: Secondary | ICD-10-CM | POA: Diagnosis not present

## 2022-12-12 LAB — BASIC METABOLIC PANEL
Anion gap: 6 (ref 5–15)
Anion gap: 8 (ref 5–15)
BUN: 8 mg/dL (ref 8–23)
BUN: 13 mg/dL (ref 8–23)
CO2: 23 mmol/L (ref 22–32)
CO2: 25 mmol/L (ref 22–32)
Calcium: 7.7 mg/dL — ABNORMAL LOW (ref 8.9–10.3)
Calcium: 8.1 mg/dL — ABNORMAL LOW (ref 8.9–10.3)
Chloride: 106 mmol/L (ref 98–111)
Chloride: 101 mmol/L (ref 98–111)
Creatinine, Ser: 0.86 mg/dL (ref 0.61–1.24)
Creatinine, Ser: 1.17 mg/dL (ref 0.61–1.24)
GFR, Estimated: >60 mL/min (ref >60)
GFR, Estimated: >60 mL/min (ref >60)
Glucose, Bld: 121 mg/dL — ABNORMAL HIGH (ref 70–99)
Glucose, Bld: 150 mg/dL — ABNORMAL HIGH (ref 70–99)
Potassium: 3.7 mmol/L (ref 3.5–5.1)
Potassium: 4.6 mmol/L (ref 3.5–5.1)
Sodium: 135 mmol/L (ref 135–145)
Sodium: 134 mmol/L — ABNORMAL LOW (ref 135–145)

## 2022-12-12 LAB — GLUCOSE, CAPILLARY
Glucose-Capillary: 103 mg/dL — ABNORMAL HIGH (ref 70–99)
Glucose-Capillary: 110 mg/dL — ABNORMAL HIGH (ref 70–99)
Glucose-Capillary: 114 mg/dL — ABNORMAL HIGH (ref 70–99)
Glucose-Capillary: 122 mg/dL — ABNORMAL HIGH (ref 70–99)
Glucose-Capillary: 123 mg/dL — ABNORMAL HIGH (ref 70–99)
Glucose-Capillary: 126 mg/dL — ABNORMAL HIGH (ref 70–99)
Glucose-Capillary: 130 mg/dL — ABNORMAL HIGH (ref 70–99)
Glucose-Capillary: 131 mg/dL — ABNORMAL HIGH (ref 70–99)
Glucose-Capillary: 154 mg/dL — ABNORMAL HIGH (ref 70–99)
Glucose-Capillary: 162 mg/dL — ABNORMAL HIGH (ref 70–99)

## 2022-12-12 LAB — MAGNESIUM
Magnesium: 2.2 mg/dL (ref 1.7–2.4)
Magnesium: 2 mg/dL (ref 1.7–2.4)

## 2022-12-12 LAB — CBC
HCT: 25.3 % — ABNORMAL LOW (ref 39.0–52.0)
HCT: 28 % — ABNORMAL LOW (ref 39.0–52.0)
Hemoglobin: 8.8 g/dL — ABNORMAL LOW (ref 13.0–17.0)
Hemoglobin: 9.8 g/dL — ABNORMAL LOW (ref 13.0–17.0)
MCH: 36.2 pg — ABNORMAL HIGH (ref 26.0–34.0)
MCH: 36.7 pg — ABNORMAL HIGH (ref 26.0–34.0)
MCHC: 34.8 g/dL (ref 30.0–36.0)
MCHC: 35 g/dL (ref 30.0–36.0)
MCV: 104.1 fL — ABNORMAL HIGH (ref 80.0–100.0)
MCV: 104.9 fL — ABNORMAL HIGH (ref 80.0–100.0)
Platelets: 126 10*3/uL — ABNORMAL LOW (ref 150–400)
Platelets: 148 10*3/uL — ABNORMAL LOW (ref 150–400)
RBC: 2.43 MIL/uL — ABNORMAL LOW (ref 4.22–5.81)
RBC: 2.67 MIL/uL — ABNORMAL LOW (ref 4.22–5.81)
RDW: 12.7 % (ref 11.5–15.5)
RDW: 13 % (ref 11.5–15.5)
WBC: 10.7 10*3/uL — ABNORMAL HIGH (ref 4.0–10.5)
WBC: 15 10*3/uL — ABNORMAL HIGH (ref 4.0–10.5)
nRBC: 0 % (ref 0.0–0.2)
nRBC: 0 % (ref 0.0–0.2)

## 2022-12-12 MED ORDER — FUROSEMIDE 10 MG/ML IJ SOLN: 20.0000 mg | Freq: Two times a day (BID) | INTRAMUSCULAR | Status: DC

## 2022-12-12 MED ORDER — METOPROLOL TARTRATE 25 MG PO TABS
25.0000 mg | ORAL_TABLET | Freq: Two times a day (BID) | ORAL | Status: DC
  Administered 2022-12-12 – 2022-12-18 (×13): 25 mg via ORAL
  Filled 2022-12-12 (×13): qty 1

## 2022-12-12 MED ORDER — TRAMADOL HCL 50 MG PO TABS: 50.0000 mg | ORAL_TABLET | ORAL | Status: DC | PRN

## 2022-12-12 MED ORDER — ENOXAPARIN SODIUM 30 MG/0.3ML IJ SOSY
30.0000 mg | PREFILLED_SYRINGE | Freq: Every day | INTRAMUSCULAR | Status: DC
  Administered 2022-12-12 – 2022-12-14 (×3): 30 mg via SUBCUTANEOUS
  Filled 2022-12-12 (×3): qty 0.3

## 2022-12-12 MED ORDER — ENOXAPARIN SODIUM 40 MG/0.4ML IJ SOSY: 40.0000 mg | PREFILLED_SYRINGE | Freq: Every day | INTRAMUSCULAR | Status: DC

## 2022-12-12 MED ORDER — OXYCODONE HCL 5 MG PO TABS
5.0000 mg | ORAL_TABLET | ORAL | Status: DC | PRN
  Administered 2022-12-12: 5 mg via ORAL
  Administered 2022-12-12 – 2022-12-18 (×23): 10 mg via ORAL
  Filled 2022-12-12 (×23): qty 2

## 2022-12-12 MED ORDER — INSULIN ASPART 100 UNIT/ML IJ SOLN
0.0000 [IU] | INTRAMUSCULAR | Status: DC
  Administered 2022-12-12 (×3): 2 [IU] via SUBCUTANEOUS
  Administered 2022-12-13: 4 [IU] via SUBCUTANEOUS

## 2022-12-12 MED ORDER — FERROUS SULFATE 325 (65 FE) MG PO TABS
325.0000 mg | ORAL_TABLET | Freq: Every evening | ORAL | Status: DC
  Administered 2022-12-12 – 2022-12-17 (×6): 325 mg via ORAL
  Filled 2022-12-12 (×7): qty 1

## 2022-12-12 MED ORDER — FUROSEMIDE 10 MG/ML IJ SOLN
20.0000 mg | Freq: Two times a day (BID) | INTRAMUSCULAR | Status: AC
  Administered 2022-12-12 (×2): 20 mg via INTRAVENOUS
  Filled 2022-12-12 (×2): qty 2

## 2022-12-12 MED ORDER — ATORVASTATIN CALCIUM 10 MG PO TABS
10.0000 mg | ORAL_TABLET | Freq: Every day | ORAL | Status: DC
  Administered 2022-12-12: 10 mg via ORAL
  Filled 2022-12-12: qty 1

## 2022-12-12 MED ORDER — POTASSIUM CHLORIDE CRYS ER 20 MEQ PO TBCR
20.0000 meq | EXTENDED_RELEASE_TABLET | ORAL | Status: AC
  Administered 2022-12-12 (×3): 20 meq via ORAL
  Filled 2022-12-12 (×3): qty 1

## 2022-12-12 MED ORDER — LORATADINE 10 MG PO TABS
10.0000 mg | ORAL_TABLET | Freq: Every day | ORAL | Status: DC
  Administered 2022-12-12 – 2022-12-18 (×7): 10 mg via ORAL
  Filled 2022-12-12 (×7): qty 1

## 2022-12-12 MED ORDER — INSULIN ASPART 100 UNIT/ML IJ SOLN: 0.0000 [IU] | INTRAMUSCULAR | Status: DC

## 2022-12-12 MED ORDER — MELATONIN 5 MG PO TABS
10.0000 mg | ORAL_TABLET | Freq: Every day | ORAL | Status: DC
  Administered 2022-12-12 – 2022-12-17 (×6): 10 mg via ORAL
  Filled 2022-12-12 (×6): qty 2

## 2022-12-12 MED ORDER — DOCUSATE SODIUM 100 MG PO CAPS
100.0000 mg | ORAL_CAPSULE | Freq: Two times a day (BID) | ORAL | Status: DC
  Administered 2022-12-12 – 2022-12-13 (×4): 100 mg via ORAL
  Filled 2022-12-12 (×4): qty 1

## 2022-12-12 MED FILL — Heparin Sodium (Porcine) Inj 1000 Unit/ML: Qty: 1000 | Status: AC

## 2022-12-12 MED FILL — Lidocaine HCl Local Preservative Free (PF) Inj 2%: INTRAMUSCULAR | Qty: 14 | Status: AC

## 2022-12-12 MED FILL — Potassium Chloride Inj 2 mEq/ML: INTRAVENOUS | Qty: 40 | Status: AC

## 2022-12-12 NOTE — Plan of Care (Signed)
  Problem: Cardiovascular: Goal: Ability to achieve and maintain adequate cardiovascular perfusion will improve Outcome: Progressing Goal: Vascular access site(s) Level 0-1 will be maintained Outcome: Progressing   Problem: Education: Goal: Knowledge of General Education information will improve Description: Including pain rating scale, medication(s)/side effects and non-pharmacologic comfort measures Outcome: Progressing   Problem: Clinical Measurements: Goal: Will remain free from infection Outcome: Progressing Goal: Diagnostic test results will improve Outcome: Progressing Goal: Respiratory complications will improve Outcome: Progressing Goal: Cardiovascular complication will be avoided Outcome: Progressing

## 2022-12-12 NOTE — Progress Notes (Addendum)
TCTS DAILY ICU PROGRESS NOTE                   [address]            [place] [address]          [phone]   1 Day Post-Op Procedure(s) (LRB): CORONARY ARTERY BYPASS GRAFTING (CABG)TIMES FOUR UTILIZING LEFT INTERNAL MAMMARY ARTERY AND ENDOSCOPIC VEIN HARVEST RIGHT GREATER SAPHENOUS VEIN (N/A) TRANSESOPHAGEAL ECHOCARDIOGRAM (N/A)  Total Length of Stay:  LOS: 1 day   Subjective: Patient states he hopes to not have to lie flat all day.  Objective: Vital signs in last 24 hours: Temp:  [96.4 F (35.8 C)-101.1 F (38.4 C)] 98.8 F (37.1 C) (09/19 0700) Pulse Rate:  [52-109] 88 (09/19 0700) Cardiac Rhythm: Normal sinus rhythm (09/19 0400) Resp:  [13-28] 17 (09/19 0700) BP: (86-140)/(56-104) 131/71 (09/19 0700) SpO2:  [93 %-100 %] 98 % (09/19 0700) Arterial Line BP: (74-148)/(43-72) 126/59 (09/19 0700) FiO2 (%):  [40 %-50 %] 40 % (09/18 2000) Weight:  [105.4 kg] 105.4 kg (09/19 0500)  Filed Weights   12/11/22 0653 12/12/22 0500  Weight: 104.3 kg 105.4 kg    Weight change: 1.073 kg   Hemodynamic parameters for last 24 hours: CVP:  [1 mmHg-13 mmHg] 7 mmHg CO:  [4.4 L/min-8.1 L/min] 7 L/min CI:  [2 L/min/m2-3.6 L/min/m2] 3.1 L/min/m2  Intake/Output from previous day: 09/18 0701 - 09/19 0700 In: 4188.7 [P.O.:40; I.V.:2092.4; Blood:229; IV Piggyback:1827.3] Out: 4003 [Urine:2945; Emesis/NG output:70; Blood:480; Chest Tube:508]  Intake/Output this shift: No intake/output data recorded.  Current Meds: Scheduled Meds:  acetaminophen  1,000 mg Oral Q6H   aspirin EC  325 mg Oral Daily   aspirin  300 mg Rectal Once   atorvastatin  10 mg Oral Daily   bisacodyl  10 mg Oral Daily   Chlorhexidine Gluconate Cloth  6 each Topical Daily   docusate sodium  100 mg Oral Q12H   ferrous sulfate  325 mg Oral QPM   loratadine  10 mg Oral Daily   melatonin  10 mg Oral QHS   metoCLOPramide (REGLAN) injection  10 mg Intravenous Q6H   metoprolol tartrate  12.5 mg Oral  BID   mirtazapine  30 mg Oral QHS   [START ON 12/13/2022] pantoprazole  40 mg Oral Daily   pantoprazole (PROTONIX) IV  40 mg Intravenous QHS   potassium chloride  20 mEq Oral Q4H   sodium chloride flush  3 mL Intravenous Q12H   Continuous Infusions:  sodium chloride     sodium chloride     sodium chloride     albumin human Stopped (12/12/22 0037)    ceFAZolin (ANCEF) IV Stopped (12/12/22 0547)   dexmedetomidine (PRECEDEX) IV infusion Stopped (12/12/22 0510)   insulin 0.8 Units/hr (12/12/22 0700)   lactated ringers     lactated ringers 20 mL/hr at 12/12/22 0700   niCARDipine     norepinephrine (LEVOPHED) Adult infusion Stopped (12/12/22 0531)   PRN Meds:.sodium chloride, albumin human, dextrose, metoprolol tartrate, midazolam, morphine injection, ondansetron (ZOFRAN) IV, oxyCODONE, sodium chloride flush, traMADol  General appearance: alert, cooperative, and no distress Neurologic: intact Heart: RRR Lungs: Diminished bibasilar breath sounds Abdomen: Soft, obese, non tender, sporadic bowel sounds Extremities: SCDs in place. Ecchymosis right thigh Wound: Aquacel intact. RLE wound is clean and dry.   Lab Results: CBC: Recent Labs    12/11/22 2017 12/11/22 2047 12/11/22 2246 12/12/22 0409  WBC 11.2*  --   --  10.7*  HGB 9.8*   < > 9.5* 8.8*  HCT 28.8*   < > 28.0* 25.3*  PLT 156  --   --  126*   < > = values in this interval not displayed.   BMET:  Recent Labs    12/11/22 2017 12/11/22 2047 12/11/22 2246 12/12/22 0409  NA 136   < > 139 135  K 4.2   < > 4.0 3.7  CL 105  --   --  106  CO2 20*  --   --  23  GLUCOSE 127*  --   --  121*  BUN 8  --   --  8  CREATININE 0.83  --   --  0.86  CALCIUM 7.7*  --   --  7.7*   < > = values in this interval not displayed.    CMET: Lab Results  Component Value Date   WBC 10.7 (H) 12/12/2022   HGB 8.8 (L) 12/12/2022   HCT 25.3 (L) 12/12/2022   PLT 126 (L) 12/12/2022   GLUCOSE 121 (H) 12/12/2022   CHOL 174 04/22/2019   TRIG  70 04/22/2019   HDL 87 04/22/2019   LDLCALC 74 04/22/2019   ALT 31 12/09/2022   AST 44 (H) 12/09/2022   NA 135 12/12/2022   K 3.7 12/12/2022   CL 106 12/12/2022   CREATININE 0.86 12/12/2022   BUN 8 12/12/2022   CO2 23 12/12/2022   TSH 1.290 01/13/2020   PSA 1.27 07/05/2009   INR 1.5 (H) 12/11/2022   HGBA1C 4.9 12/09/2022      PT/INR:  Recent Labs    12/11/22 1447  LABPROT 18.3*  INR 1.5*   Radiology: [facility] Chest Port 1 View  Result Date: 12/11/2022 CLINICAL DATA:  Pneumothorax.  Status post CABG. EXAM: PORTABLE CHEST 1 VIEW COMPARISON:  Preoperative radiograph 12/09/2022 FINDINGS: Interval median sternotomy. The endotracheal tube tip is approximately 2.7 cm from the carina. Enteric tube tip below the diaphragm. Enteric side port is in the region of the distal esophagus. Right internal jugular central line tip overlies the SVC. Mediastinal drains in place. Heart size upper normal. Low lung volumes. Bronchovascular crowding. No definite pneumothorax. Streaky right infrahilar opacity favor scarring. No large pleural effusion. IMPRESSION: 1. Interval median sternotomy. Endotracheal tube tip approximately 2.7 cm from the carina. Enteric tube tip below the diaphragm, side-port in the region of the distal esophagus. Right internal jugular central line tip overlies the SVC. 2. Low lung volumes with bronchovascular crowding. No definite pneumothorax. Electronically Signed   By: Narda Rutherford M.D.   On: 12/11/2022 15:45   EP STUDY  Result Date: 12/11/2022 See surgical note for result.    Assessment/Plan: S/P Procedure(s) (LRB): CORONARY ARTERY BYPASS GRAFTING (CABG)TIMES FOUR UTILIZING LEFT INTERNAL MAMMARY ARTERY AND ENDOSCOPIC VEIN HARVEST RIGHT GREATER SAPHENOUS VEIN (N/A) TRANSESOPHAGEAL ECHOCARDIOGRAM (N/A) CV-CO/CI 8/3.6. On Lopressor 12.5 mg bid.  Pulmonary-Chest tubes with 508 cc since surgery. CXR appears this am to show patient rotated to the right. Encourage incentive  spirometer. Volume overload-will give IV Lasix 20 mg bid this am Expected post op blood loss anemia-H and H this am decreased to 8.8 and 25.3. On Ferrous sulfate (on prior to surgery) CBGs 122/114/126. Pre op HGA1C 4.9. Transition off Insulin drip. 6. Supplement potassium 7. Lovenox for DVT prophylaxis 8. Thrombocytopenia-platelets this am 126,000 9. Remove femoral a line  Donielle Margaretann Loveless PA-C 12/12/2022 7:38 AM  Agree with above POD 1 progression  Benjimen Kelley O Sahas Sluka

## 2022-12-12 NOTE — Consult Note (Signed)
NAME:  Erik [name], MRN:  [id number], DOB:  1945-11-10, LOS: 1 ADMISSION DATE:  12/11/2022, CONSULTATION DATE:  9/18 REFERRING MD:  Dr. Cliffton Asters, CHIEF COMPLAINT:  CABG x 4  History of Present Illness:  Patient is a 77 yo M w/ pertinent PMH 3V CAD, HTN presents to [facility] on 9/18 for CABG.  Patient had heart cath in April 2017 showing focal lesion in circumflex and total chronic occlusion of RCA w/ left to right collaterals. Patient treated medically at that time. August 2024 patient seen by cards for chest tightness and pain worse w/ activity which improves w/ rest and nitroglycerin. Repeat Cath showing 3 vessel CAD. Echo LVEF 70-75%; Grade I diastolic dysfunction; mild-moderate mitral valve regurgitation. TCTS consulted. Scheduled for CABG.  On 9/18 patient post CABG x 4. PCCM consulted.  Pertinent  Medical History   Past Medical History:  Diagnosis Date   Arthritis    Coronary artery disease    GERD (gastroesophageal reflux disease)    History of echocardiogram    Echo 4/17 - mild concentric LVH, EF 55-60%, no RWMA, Gr 1 DD, mild AI, mild MR, mild LAE   Hypertension    Kidney stone    Obesity    Tinea corporis     Significant Hospital Events: Including procedures, antibiotic start and stop dates in addition to other pertinent events   9/18 CABG x 4  Interim History / Subjective:   Doing well post extubation. Plans to remove pacer wires.   Objective   Blood pressure 131/71, pulse 88, temperature 98.8 F (37.1 C), resp. rate 17, height 5\' 10"  (1.778 m), weight 105.4 kg, SpO2 98%. CVP:  [1 mmHg-13 mmHg] 7 mmHg CO:  [4.4 L/min-8.1 L/min] 7 L/min CI:  [2 L/min/m2-3.6 L/min/m2] 3.1 L/min/m2  Vent Mode: PSV FiO2 (%):  [40 %-50 %] 40 % Set Rate:  [4 bmp-16 bmp] 4 bmp Vt Set:  [580 mL] 580 mL PEEP:  [5 cmH20] 5 cmH20 Pressure Support:  [10 cmH20] 10 cmH20 Plateau Pressure:  [18 cmH20] 18 cmH20   Intake/Output Summary (Last 24 hours) at 12/12/2022 0925 Last data filed at  12/12/2022 0900 Gross per 24 hour  Intake 4139.51 ml  Output 4043 ml  Net 96.51 ml   Filed Weights   12/11/22 0653 12/12/22 0500  Weight: 104.3 kg 105.4 kg    Examination: General: sitting up in bed, doing well post extubation  HEENT: MMM, alert following commands  Neuro: alert following commands  CV: s1 s2, RRR  PULM:  diminshed breath sounds BL GI: soft nt nd  Extremities: no edema  Skin: no rash   Resolved Hospital Problem list     Assessment & Plan:   Postoperative vent management, as expected, no extubated  Plan: Doing well extubated Continue progressive mobility   S/p 4-vessel CABG CAD HTN Grade 1 diastolic CHF Plan: Off drips and pressors Removing lines  Removal femoral, plans to get up in chair or sit up  Hopeful for walking soon  Chest tubes and drains remain in place    Best Practice (right click and "Reselect all SmartList Selections" daily)   Diet/type: NPO w/ meds via tube DVT prophylaxis: SCD GI prophylaxis: PPI Lines: Central line and Arterial Line Foley:  Yes, and it is still needed Code Status:  full code Last date of multidisciplinary goals of care discussion [per primary]  Labs   CBC: Recent Labs  Lab 12/09/22 1200 12/11/22 0907 12/11/22 1231 12/11/22 1243 12/11/22 1447 12/11/22 1448 12/11/22 2017  12/11/22 2047 12/11/22 2246 12/12/22 0409  WBC 10.1  --   --   --  13.3*  --  11.2*  --   --  10.7*  HGB 14.5   < > 8.1*   < > 9.3* 9.5* 9.8* 9.5* 9.5* 8.8*  HCT 41.5   < > 23.7*   < > 26.9* 28.0* 28.8* 28.0* 28.0* 25.3*  MCV 101.2*  --   --   --  103.9*  --  102.9*  --   --  104.1*  PLT 228  --  128*  --  116*  --  156  --   --  126*   < > = values in this interval not displayed.    Basic Metabolic Panel: Recent Labs  Lab 12/09/22 1200 12/11/22 0907 12/11/22 1210 12/11/22 1243 12/11/22 1311 12/11/22 1332 12/11/22 1336 12/11/22 1448 12/11/22 2017 12/11/22 2047 12/11/22 2246 12/12/22 0409  NA 136   < > 137   < > 138  139   < > 138 136 139 139 135  K 4.9   < > 4.6   < > 4.6 4.1   < > 3.9 4.2 4.2 4.0 3.7  CL 105   < > 101  --  101 102  --   --  105  --   --  106  CO2 21*  --   --   --   --   --   --   --  20*  --   --  23  GLUCOSE 101*   < > 112*  --  115* 132*  --   --  127*  --   --  121*  BUN 7*   < > 9  --  8 8  --   --  8  --   --  8  CREATININE 0.98   < > 0.70  --  0.70 0.70  --   --  0.83  --   --  0.86  CALCIUM 8.8*  --   --   --   --   --   --   --  7.7*  --   --  7.7*  MG  --   --   --   --   --   --   --   --  2.6*  --   --  2.2   < > = values in this interval not displayed.   GFR: Estimated Creatinine Clearance: 87.5 mL/min (by C-G formula based on SCr of 0.86 mg/dL). Recent Labs  Lab 12/09/22 1200 12/11/22 1447 12/11/22 2017 12/12/22 0409  WBC 10.1 13.3* 11.2* 10.7*    Liver Function Tests: Recent Labs  Lab 12/09/22 1200  AST 44*  ALT 31  ALKPHOS 57  BILITOT 0.8  PROT 6.4*  ALBUMIN 3.8   No results for input(s): "LIPASE", "AMYLASE" in the last 168 hours. No results for input(s): "AMMONIA" in the last 168 hours.  ABG    Component Value Date/Time   PHART 7.386 12/11/2022 2246   PCO2ART 34.4 12/11/2022 2246   PO2ART 108 12/11/2022 2246   HCO3 20.4 12/11/2022 2246   TCO2 21 (L) 12/11/2022 2246   ACIDBASEDEF 4.0 (H) 12/11/2022 2246   O2SAT 98 12/11/2022 2246     Coagulation Profile: Recent Labs  Lab 12/09/22 1200 12/11/22 1447  INR 1.0 1.5*    Cardiac Enzymes: No results for input(s): "CKTOTAL", "CKMB", "CKMBINDEX", "TROPONINI" in the last 168 hours.  HbA1C: Hgb  A1c MFr Bld  Date/Time Value Ref Range Status  12/09/2022 11:41 AM 4.9 4.8 - 5.6 % Final    Comment:    (NOTE) Pre diabetes:          5.7%-6.4%  Diabetes:              >6.4%  Glycemic control for   <7.0% adults with diabetes     CBG: Recent Labs  Lab 12/12/22 0124 12/12/22 0226 12/12/22 0329 12/12/22 0521 12/12/22 0749  GLUCAP 130* 122* 114* 126* 103*     Josephine Igo,  DO [facility] 12/12/2022 9:25 AM

## 2022-12-13 ENCOUNTER — Inpatient Hospital Stay (HOSPITAL_COMMUNITY): Payer: Medicare Other

## 2022-12-13 DIAGNOSIS — I25118 Atherosclerotic heart disease of native coronary artery with other forms of angina pectoris: Secondary | ICD-10-CM

## 2022-12-13 LAB — CBC
HCT: 26.5 % — ABNORMAL LOW (ref 39.0–52.0)
Hemoglobin: 9.2 g/dL — ABNORMAL LOW (ref 13.0–17.0)
MCH: 36.4 pg — ABNORMAL HIGH (ref 26.0–34.0)
MCHC: 34.7 g/dL (ref 30.0–36.0)
MCV: 104.7 fL — ABNORMAL HIGH (ref 80.0–100.0)
Platelets: 137 10*3/uL — ABNORMAL LOW (ref 150–400)
RBC: 2.53 MIL/uL — ABNORMAL LOW (ref 4.22–5.81)
RDW: 12.9 % (ref 11.5–15.5)
WBC: 16 10*3/uL — ABNORMAL HIGH (ref 4.0–10.5)
nRBC: 0 % (ref 0.0–0.2)

## 2022-12-13 LAB — BASIC METABOLIC PANEL
Anion gap: 7 (ref 5–15)
BUN: 13 mg/dL (ref 8–23)
CO2: 24 mmol/L (ref 22–32)
Calcium: 8.4 mg/dL — ABNORMAL LOW (ref 8.9–10.3)
Chloride: 104 mmol/L (ref 98–111)
Creatinine, Ser: 1.04 mg/dL (ref 0.61–1.24)
GFR, Estimated: >60 mL/min (ref >60)
Glucose, Bld: 125 mg/dL — ABNORMAL HIGH (ref 70–99)
Potassium: 4.2 mmol/L (ref 3.5–5.1)
Sodium: 135 mmol/L (ref 135–145)

## 2022-12-13 LAB — GLUCOSE, CAPILLARY
Glucose-Capillary: 165 mg/dL — ABNORMAL HIGH (ref 70–99)
Glucose-Capillary: 75 mg/dL (ref 70–99)
Glucose-Capillary: 95 mg/dL (ref 70–99)
Glucose-Capillary: 97 mg/dL (ref 70–99)
Glucose-Capillary: 99 mg/dL (ref 70–99)

## 2022-12-13 MED ORDER — FUROSEMIDE 10 MG/ML IJ SOLN
40.0000 mg | Freq: Once | INTRAMUSCULAR | Status: AC
  Administered 2022-12-13: 40 mg via INTRAVENOUS
  Filled 2022-12-13: qty 4

## 2022-12-13 MED ORDER — AMIODARONE HCL IN DEXTROSE 360-4.14 MG/200ML-% IV SOLN: 30.0000 mg/h | INTRAVENOUS | Status: DC

## 2022-12-13 MED ORDER — ATORVASTATIN CALCIUM 40 MG PO TABS
40.0000 mg | ORAL_TABLET | Freq: Every day | ORAL | Status: DC
  Administered 2022-12-13 – 2022-12-18 (×6): 40 mg via ORAL
  Filled 2022-12-13 (×6): qty 1

## 2022-12-13 MED ORDER — POTASSIUM CHLORIDE CRYS ER 20 MEQ PO TBCR
20.0000 meq | EXTENDED_RELEASE_TABLET | Freq: Every day | ORAL | Status: DC
  Administered 2022-12-13 – 2022-12-14 (×2): 20 meq via ORAL
  Filled 2022-12-13 (×2): qty 1

## 2022-12-13 MED ORDER — AMIODARONE HCL IN DEXTROSE 360-4.14 MG/200ML-% IV SOLN
30.0000 mg/h | INTRAVENOUS | Status: DC
  Administered 2022-12-13 – 2022-12-14 (×3): 30 mg/h via INTRAVENOUS
  Filled 2022-12-13 (×4): qty 200

## 2022-12-13 MED ORDER — AMIODARONE LOAD VIA INFUSION
150.0000 mg | Freq: Once | INTRAVENOUS | Status: AC
  Administered 2022-12-13: 150 mg via INTRAVENOUS
  Filled 2022-12-13: qty 83.34

## 2022-12-13 MED ORDER — AMIODARONE HCL IN DEXTROSE 360-4.14 MG/200ML-% IV SOLN
60.0000 mg/h | INTRAVENOUS | Status: DC
  Administered 2022-12-13 (×2): 60 mg/h via INTRAVENOUS
  Filled 2022-12-13: qty 200

## 2022-12-13 MED ORDER — AMIODARONE HCL IN DEXTROSE 360-4.14 MG/200ML-% IV SOLN
60.0000 mg/h | INTRAVENOUS | Status: DC
  Filled 2022-12-13: qty 200

## 2022-12-13 NOTE — Progress Notes (Addendum)
TCTS DAILY ICU PROGRESS NOTE                   [address]            [place] [address]          [phone]   2 Days Post-Op Procedure(s) (LRB): CORONARY ARTERY BYPASS GRAFTING (CABG)TIMES FOUR UTILIZING LEFT INTERNAL MAMMARY ARTERY AND ENDOSCOPIC VEIN HARVEST RIGHT GREATER SAPHENOUS VEIN (N/A) TRANSESOPHAGEAL ECHOCARDIOGRAM (N/A)  Total Length of Stay:  LOS: 2 days   Subjective: Patient sitting in the chair.  Objective: Vital signs in last 24 hours: Temp:  [97.7 F (36.5 C)-100.8 F (38.2 C)] 97.7 F (36.5 C) (09/20 0442) Pulse Rate:  [70-110] 110 (09/20 0700) Cardiac Rhythm: Normal sinus rhythm;Sinus tachycardia (09/20 0000) Resp:  [11-30] 11 (09/20 0700) BP: (89-149)/(58-97) 129/83 (09/20 0700) SpO2:  [90 %-100 %] 94 % (09/20 0700) Arterial Line BP: (99-137)/(47-62) 133/62 (09/19 1115) Weight:  [213 kg] 107 kg (09/20 0500)  Filed Weights   12/11/22 0653 12/12/22 0500 12/13/22 0500  Weight: 104.3 kg 105.4 kg 107 kg    Weight change: 1.6 kg   Hemodynamic parameters for last 24 hours: CVP:  [1 mmHg-9 mmHg] 9 mmHg CO:  [6.1 L/min-8.2 L/min] 6.1 L/min CI:  [2.8 L/min/m2-3.7 L/min/m2] 2.8 L/min/m2  Intake/Output from previous day: 09/19 0701 - 09/20 0700 In: 1025.4 [P.O.:250; I.V.:475.2; IV Piggyback:300.1] Out: 2230 [Urine:1900; Chest Tube:330]  Intake/Output this shift: No intake/output data recorded.  Current Meds: Scheduled Meds:  acetaminophen  1,000 mg Oral Q6H   aspirin EC  325 mg Oral Daily   aspirin  300 mg Rectal Once   atorvastatin  10 mg Oral Daily   bisacodyl  10 mg Oral Daily   Chlorhexidine Gluconate Cloth  6 each Topical Daily   docusate sodium  100 mg Oral Q12H   enoxaparin (LOVENOX) injection  30 mg Subcutaneous QHS   ferrous sulfate  325 mg Oral QPM   insulin aspart  0-24 Units Subcutaneous Q4H   loratadine  10 mg Oral Daily   melatonin  10 mg Oral QHS   metoprolol tartrate  25 mg Oral BID   mirtazapine  30 mg Oral  QHS   pantoprazole  40 mg Oral Daily   sodium chloride flush  3 mL Intravenous Q12H   Continuous Infusions:  sodium chloride     sodium chloride     sodium chloride     albumin human Stopped (12/12/22 0037)    ceFAZolin (ANCEF) IV Stopped (12/13/22 0865)   lactated ringers     lactated ringers Stopped (12/13/22 0639)   PRN Meds:.sodium chloride, albumin human, metoprolol tartrate, midazolam, morphine injection, ondansetron (ZOFRAN) IV, oxyCODONE, sodium chloride flush, traMADol  General appearance: He is alert, cooperative, and no distress Neurologic: intact Heart: IRRR IRRR Lungs: Diminished bibasilar breath sounds Abdomen: Soft, obese, non tender, sporadic bowel sounds Extremities: SCDs in place. Ecchymosis right thigh Wound: Aquacel intact. RLE wound is clean and dry. Minor sero sanguinous drainage from lower "stab" wound  Lab Results: CBC: Recent Labs    12/12/22 1746 12/13/22 0422  WBC 15.0* 16.0*  HGB 9.8* 9.2*  HCT 28.0* 26.5*  PLT 148* 137*   BMET:  Recent Labs    12/12/22 1746 12/13/22 0422  NA 134* 135  K 4.6 4.2  CL 101 104  CO2 25 24  GLUCOSE 150* 125*  BUN 13 13  CREATININE 1.17 1.04  CALCIUM 8.1* 8.4*    CMET: Lab  Results  Component Value Date   WBC 16.0 (H) 12/13/2022   HGB 9.2 (L) 12/13/2022   HCT 26.5 (L) 12/13/2022   PLT 137 (L) 12/13/2022   GLUCOSE 125 (H) 12/13/2022   CHOL 174 04/22/2019   TRIG 70 04/22/2019   HDL 87 04/22/2019   LDLCALC 74 04/22/2019   ALT 31 12/09/2022   AST 44 (H) 12/09/2022   NA 135 12/13/2022   K 4.2 12/13/2022   CL 104 12/13/2022   CREATININE 1.04 12/13/2022   BUN 13 12/13/2022   CO2 24 12/13/2022   TSH 1.290 01/13/2020   PSA 1.27 07/05/2009   INR 1.5 (H) 12/11/2022   HGBA1C 4.9 12/09/2022      PT/INR:  Recent Labs    12/11/22 1447  LABPROT 18.3*  INR 1.5*   Radiology: No results found.   Assessment/Plan: S/P Procedure(s) (LRB): CORONARY ARTERY BYPASS GRAFTING (CABG)TIMES FOUR UTILIZING  LEFT INTERNAL MAMMARY ARTERY AND ENDOSCOPIC VEIN HARVEST RIGHT GREATER SAPHENOUS VEIN (N/A) TRANSESOPHAGEAL ECHOCARDIOGRAM (N/A) CV-A fib started earlier this am. Amiodarone drip ordered and on Lopressor 25 mg bid. Will titrate BB as able. Pulmonary-Chest tubes with 330 cc last 24 hours. CXR appears this am to show low lung volumes, bibasilar atelectasis/small effusions. Hope to remove chest tubes if output remains low this am. Encourage incentive spirometer and flutter valve Volume overload-will give IV Lasix 40 mg today Expected post op blood loss anemia-H and H this am stable at 9.2 and 26.5. History of  anemia so continue ferrous sulfate (on prior to surgery) CBGs 131/95/75. Pre op HGA1C 4.9. Will stop accu checks and SS PRN upon transfer. 6. Lovenox for DVT prophylaxis 7. Thrombocytopenia-platelets this am 137,000 8. WBC slightly increased to 16,000-had fever to 100.8. Likely related to SIRS/atelectasis. Monitor 8. Will consider transfer to floor in am;will remove sleeve once off Amiodarone drip  Donielle Margaretann Loveless PA-C 12/13/2022 7:34 AM  Afib with RVR this am.  Rate controlled now.  Will start amio protocol Will diurese Will remove Cts Floor tomorrow  Corliss Skains

## 2022-12-13 NOTE — Plan of Care (Signed)
  Problem: Education: Goal: Understanding of CV disease, CV risk reduction, and recovery process will improve Outcome: Progressing   Problem: Activity: Goal: Ability to return to baseline activity level will improve Outcome: Progressing   Problem: Cardiovascular: Goal: Ability to achieve and maintain adequate cardiovascular perfusion will improve Outcome: Progressing   Problem: Clinical Measurements: Goal: Ability to maintain clinical measurements within normal limits will improve Outcome: Progressing Goal: Will remain free from infection Outcome: Progressing Goal: Diagnostic test results will improve Outcome: Progressing Goal: Respiratory complications will improve Outcome: Progressing Goal: Cardiovascular complication will be avoided Outcome: Progressing

## 2022-12-13 NOTE — Progress Notes (Signed)
NAME:  Erik [name], MRN:  [id number], DOB:  08/17/1945, LOS: 2 ADMISSION DATE:  12/11/2022, CONSULTATION DATE:  9/18 REFERRING MD:  Dr. Cliffton Asters, CHIEF COMPLAINT:  CABG x 4  History of Present Illness:  Patient is a 77 yo M w/ pertinent PMH 3V CAD, HTN presents to [facility] on 9/18 for CABG.  Patient had heart cath in April 2017 showing focal lesion in circumflex and total chronic occlusion of RCA w/ left to right collaterals. Patient treated medically at that time. August 2024 patient seen by cards for chest tightness and pain worse w/ activity which improves w/ rest and nitroglycerin. Repeat Cath showing 3 vessel CAD. Echo LVEF 70-75%; Grade I diastolic dysfunction; mild-moderate mitral valve regurgitation. TCTS consulted. Scheduled for CABG.  On 9/18 patient post CABG x 4. PCCM consulted.  Pertinent  Medical History   Past Medical History:  Diagnosis Date   Arthritis    Coronary artery disease    GERD (gastroesophageal reflux disease)    History of echocardiogram    Echo 4/17 - mild concentric LVH, EF 55-60%, no RWMA, Gr 1 DD, mild AI, mild MR, mild LAE   Hypertension    Kidney stone    Obesity    Tinea corporis     Significant Hospital Events: Including procedures, antibiotic start and stop dates in addition to other pertinent events   9/18 CABG x 4  Interim History / Subjective:  Doing okay In Afib Denies pain Slight confusion Doing well with IS  Objective   Blood pressure 129/83, pulse (!) 110, temperature 97.7 F (36.5 C), temperature source Oral, resp. rate 11, height 5\' 10"  (1.778 m), weight 107 kg, SpO2 94%. CVP:  [1 mmHg-9 mmHg] 9 mmHg CO:  [6.1 L/min-8.2 L/min] 6.1 L/min CI:  [2.8 L/min/m2-3.7 L/min/m2] 2.8 L/min/m2      Intake/Output Summary (Last 24 hours) at 12/13/2022 0734 Last data filed at 12/13/2022 0700 Gross per 24 hour  Intake 1025.37 ml  Output 2230 ml  Net -1204.63 ml   Filed Weights   12/11/22 0653 12/12/22 0500 12/13/22 0500  Weight: 104.3  kg 105.4 kg 107 kg    Examination: No distress Pupils equal Sternotomy scar looks okay Occasional crackles bases Heart rate irregular, slight tachy Chest drain minimal output Abd soft Global 1+ anasarca Age-related skin changes  BMP ok CBC looks okay/stable CXR stable R atelectasis  Resolved Hospital Problem list     Assessment & Plan:   Postoperative vent management- resolved S/p 4-vessel CABG Postop afib CAD HTN Grade 1 diastolic CHF  - Amio gtt - Drains per TCTS - Encourage braced coughing and IS - GDMT as able - Encourage PO - Dispo per primary - Will follow while in ICU  Myrla Halsted MD PCCM [facility] 12/13/2022 7:34 AM

## 2022-12-13 NOTE — Progress Notes (Signed)
Patient ID: Erik [name], male   DOB: 14-Feb-1946, 76 y.o.   MRN: [id number]  TCTS Evening Rounds:  Hemodynamically stable.  In and out of rate controlled atrial fib on IV amio at 30.  Good UO.  Sitting up eating.

## 2022-12-14 ENCOUNTER — Inpatient Hospital Stay (HOSPITAL_COMMUNITY): Payer: Medicare Other

## 2022-12-14 LAB — CBC
HCT: 24.9 % — ABNORMAL LOW (ref 39.0–52.0)
Hemoglobin: 8.5 g/dL — ABNORMAL LOW (ref 13.0–17.0)
MCH: 35.1 pg — ABNORMAL HIGH (ref 26.0–34.0)
MCHC: 34.1 g/dL (ref 30.0–36.0)
MCV: 102.9 fL — ABNORMAL HIGH (ref 80.0–100.0)
Platelets: 144 10*3/uL — ABNORMAL LOW (ref 150–400)
RBC: 2.42 MIL/uL — ABNORMAL LOW (ref 4.22–5.81)
RDW: 12.9 % (ref 11.5–15.5)
WBC: 14.5 10*3/uL — ABNORMAL HIGH (ref 4.0–10.5)
nRBC: 0 % (ref 0.0–0.2)

## 2022-12-14 LAB — BASIC METABOLIC PANEL
Anion gap: 8 (ref 5–15)
BUN: 18 mg/dL (ref 8–23)
CO2: 21 mmol/L — ABNORMAL LOW (ref 22–32)
Calcium: 8.2 mg/dL — ABNORMAL LOW (ref 8.9–10.3)
Chloride: 104 mmol/L (ref 98–111)
Creatinine, Ser: 1.09 mg/dL (ref 0.61–1.24)
GFR, Estimated: >60 mL/min (ref >60)
Glucose, Bld: 131 mg/dL — ABNORMAL HIGH (ref 70–99)
Potassium: 3.7 mmol/L (ref 3.5–5.1)
Sodium: 133 mmol/L — ABNORMAL LOW (ref 135–145)

## 2022-12-14 LAB — GLUCOSE, CAPILLARY
Glucose-Capillary: 100 mg/dL — ABNORMAL HIGH (ref 70–99)
Glucose-Capillary: 116 mg/dL — ABNORMAL HIGH (ref 70–99)
Glucose-Capillary: 119 mg/dL — ABNORMAL HIGH (ref 70–99)
Glucose-Capillary: 57 mg/dL — ABNORMAL LOW (ref 70–99)
Glucose-Capillary: 85 mg/dL (ref 70–99)
Glucose-Capillary: 95 mg/dL (ref 70–99)

## 2022-12-14 MED ORDER — INSULIN ASPART 100 UNIT/ML IJ SOLN: 0.0000 [IU] | Freq: Three times a day (TID) | INTRAMUSCULAR | Status: DC

## 2022-12-14 MED ORDER — POTASSIUM CHLORIDE CRYS ER 20 MEQ PO TBCR: 20.0000 meq | EXTENDED_RELEASE_TABLET | Freq: Two times a day (BID) | ORAL | Status: DC

## 2022-12-14 MED ORDER — METOLAZONE 2.5 MG PO TABS
2.5000 mg | ORAL_TABLET | Freq: Once | ORAL | Status: AC
  Administered 2022-12-14: 2.5 mg via ORAL
  Filled 2022-12-14: qty 1

## 2022-12-14 MED ORDER — FUROSEMIDE 10 MG/ML IJ SOLN
40.0000 mg | Freq: Two times a day (BID) | INTRAMUSCULAR | Status: AC
  Administered 2022-12-14 (×2): 40 mg via INTRAVENOUS
  Filled 2022-12-14 (×2): qty 4

## 2022-12-14 MED ORDER — POTASSIUM CHLORIDE CRYS ER 20 MEQ PO TBCR
20.0000 meq | EXTENDED_RELEASE_TABLET | Freq: Three times a day (TID) | ORAL | Status: AC
  Administered 2022-12-14 (×3): 20 meq via ORAL
  Filled 2022-12-14 (×3): qty 1

## 2022-12-14 NOTE — Progress Notes (Signed)
12/14/2022 Progressing well on usual pathway. Diuresis and amio+BB through weekend. Will check on Monday if still in ICU.  Myrla Halsted MD PCCM

## 2022-12-14 NOTE — Progress Notes (Signed)
3 Days Post-Op Procedure(s) (LRB): CORONARY ARTERY BYPASS GRAFTING (CABG)TIMES FOUR UTILIZING LEFT INTERNAL MAMMARY ARTERY AND ENDOSCOPIC VEIN HARVEST RIGHT GREATER SAPHENOUS VEIN (N/A) TRANSESOPHAGEAL ECHOCARDIOGRAM (N/A) Subjective:  No complaints.  Has remained in atrial fib with controlled rate 80's on IV amio 30/hr.  Objective: Vital signs in last 24 hours: Temp:  [98.3 F (36.8 C)-98.7 F (37.1 C)] 98.7 F (37.1 C) (09/21 0751) Pulse Rate:  [67-124] 88 (09/21 0814) Cardiac Rhythm: Atrial fibrillation (09/20 2000) Resp:  [14-26] 22 (09/21 0600) BP: (91-159)/(51-117) 124/57 (09/21 0814) SpO2:  [65 %-100 %] 98 % (09/21 0600) Weight:  [108.1 kg] 108.1 kg (09/21 0500)  Hemodynamic parameters for last 24 hours:    Intake/Output from previous day: 09/20 0701 - 09/21 0700 In: 1011.5 [I.V.:1011.5] Out: 1750 [Urine:1750] Intake/Output this shift: No intake/output data recorded.  General appearance: alert and cooperative, scleral edema. Neurologic: intact Heart: irregularly irregular rhythm Lungs: clear to auscultation bilaterally Extremities: edema moderate LE edema Wound: chest incision ok  Lab Results: Recent Labs    12/13/22 0422 12/14/22 0429  WBC 16.0* 14.5*  HGB 9.2* 8.5*  HCT 26.5* 24.9*  PLT 137* 144*   BMET:  Recent Labs    12/13/22 0422 12/14/22 0429  NA 135 133*  K 4.2 3.7  CL 104 104  CO2 24 21*  GLUCOSE 125* 131*  BUN 13 18  CREATININE 1.04 1.09  CALCIUM 8.4* 8.2*    PT/INR:  Recent Labs    12/11/22 1447  LABPROT 18.3*  INR 1.5*   ABG    Component Value Date/Time   PHART 7.386 12/11/2022 2246   HCO3 20.4 12/11/2022 2246   TCO2 21 (L) 12/11/2022 2246   ACIDBASEDEF 4.0 (H) 12/11/2022 2246   O2SAT 98 12/11/2022 2246   CBG (last 3)  Recent Labs    12/14/22 0027 12/14/22 0411 12/14/22 0749  GLUCAP 119* 100* 85   CXR: clear Assessment/Plan: S/P Procedure(s) (LRB): CORONARY ARTERY BYPASS GRAFTING (CABG)TIMES FOUR UTILIZING  LEFT INTERNAL MAMMARY ARTERY AND ENDOSCOPIC VEIN HARVEST RIGHT GREATER SAPHENOUS VEIN (N/A) TRANSESOPHAGEAL ECHOCARDIOGRAM (N/A)  POD 3 Hemodynamically stable   Postop atrial fibrillation now with controlled rate. Will continue Lopressor 25 bid and continue IV amio today. If he does not convert in next day or so will need anticoagulation.  Wt is still 8 lbs over preop. Continue diuresis and KCL.  IS, OOB, mobilize.   LOS: 3 days    Alleen Borne 12/14/2022

## 2022-12-14 NOTE — Plan of Care (Signed)
Problem: Education: Goal: Understanding of CV disease, CV risk reduction, and recovery process will improve Outcome: Progressing Goal: Individualized Educational Video(s) Outcome: Progressing   Problem: Activity: Goal: Ability to return to baseline activity level will improve Outcome: Progressing   Problem: Cardiovascular: Goal: Ability to achieve and maintain adequate cardiovascular perfusion will improve Outcome: Progressing Goal: Vascular access site(s) Level 0-1 will be maintained Outcome: Progressing   Problem: Health Behavior/Discharge Planning: Goal: Ability to safely manage health-related needs after discharge will improve Outcome: Progressing   Problem: Education: Goal: Knowledge of General Education information will improve Description: Including pain rating scale, medication(s)/side effects and non-pharmacologic comfort measures Outcome: Progressing   Problem: Health Behavior/Discharge Planning: Goal: Ability to manage health-related needs will improve Outcome: Progressing   Problem: Clinical Measurements: Goal: Ability to maintain clinical measurements within normal limits will improve Outcome: Progressing Goal: Will remain free from infection Outcome: Progressing Goal: Diagnostic test results will improve Outcome: Progressing Goal: Respiratory complications will improve Outcome: Progressing Goal: Cardiovascular complication will be avoided Outcome: Progressing   Problem: Activity: Goal: Risk for activity intolerance will decrease Outcome: Progressing   Problem: Nutrition: Goal: Adequate nutrition will be maintained Outcome: Progressing   Problem: Coping: Goal: Level of anxiety will decrease Outcome: Progressing   Problem: Elimination: Goal: Will not experience complications related to bowel motility Outcome: Progressing Goal: Will not experience complications related to urinary retention Outcome: Progressing   Problem: Pain Managment: Goal:  General experience of comfort will improve Outcome: Progressing   Problem: Safety: Goal: Ability to remain free from injury will improve Outcome: Progressing   Problem: Skin Integrity: Goal: Risk for impaired skin integrity will decrease Outcome: Progressing   Problem: Education: Goal: Will demonstrate proper wound care and an understanding of methods to prevent future damage Outcome: Progressing Goal: Knowledge of disease or condition will improve Outcome: Progressing Goal: Knowledge of the prescribed therapeutic regimen will improve Outcome: Progressing Goal: Individualized Educational Video(s) Outcome: Progressing   Problem: Activity: Goal: Risk for activity intolerance will decrease Outcome: Progressing   Problem: Cardiac: Goal: Will achieve and/or maintain hemodynamic stability Outcome: Progressing   Problem: Clinical Measurements: Goal: Postoperative complications will be avoided or minimized Outcome: Progressing   Problem: Respiratory: Goal: Respiratory status will improve Outcome: Progressing   Problem: Skin Integrity: Goal: Wound healing without signs and symptoms of infection Outcome: Progressing Goal: Risk for impaired skin integrity will decrease Outcome: Progressing   Problem: Urinary Elimination: Goal: Ability to achieve and maintain adequate renal perfusion and functioning will improve Outcome: Progressing   Problem: Activity: Goal: Ability to tolerate increased activity will improve Outcome: Progressing   Problem: Respiratory: Goal: Ability to maintain a clear airway and adequate ventilation will improve Outcome: Progressing   Problem: Role Relationship: Goal: Method of communication will improve Outcome: Progressing

## 2022-12-14 NOTE — Progress Notes (Signed)
Patient ID: Erik [name], male   DOB: 02-18-1946, 77 y.o.   MRN: [id number] T[facility] Evening Rounds:  Hemodynamically stable in rate controlled AF 80's. Plan to start Eliquis tomorrow if still in AF.  Ambulated well.  Diuresing well.

## 2022-12-15 LAB — BASIC METABOLIC PANEL
Anion gap: 10 (ref 5–15)
BUN: 20 mg/dL (ref 8–23)
CO2: 26 mmol/L (ref 22–32)
Calcium: 8.4 mg/dL — ABNORMAL LOW (ref 8.9–10.3)
Chloride: 100 mmol/L (ref 98–111)
Creatinine, Ser: 0.97 mg/dL (ref 0.61–1.24)
GFR, Estimated: >60 mL/min (ref >60)
Glucose, Bld: 107 mg/dL — ABNORMAL HIGH (ref 70–99)
Potassium: 3.5 mmol/L (ref 3.5–5.1)
Sodium: 136 mmol/L (ref 135–145)

## 2022-12-15 LAB — CBC
HCT: 24 % — ABNORMAL LOW (ref 39.0–52.0)
Hemoglobin: 8.2 g/dL — ABNORMAL LOW (ref 13.0–17.0)
MCH: 34.9 pg — ABNORMAL HIGH (ref 26.0–34.0)
MCHC: 34.2 g/dL (ref 30.0–36.0)
MCV: 102.1 fL — ABNORMAL HIGH (ref 80.0–100.0)
Platelets: 177 10*3/uL (ref 150–400)
RBC: 2.35 MIL/uL — ABNORMAL LOW (ref 4.22–5.81)
RDW: 13.1 % (ref 11.5–15.5)
WBC: 11.2 10*3/uL — ABNORMAL HIGH (ref 4.0–10.5)
nRBC: 0 % (ref 0.0–0.2)

## 2022-12-15 MED ORDER — SODIUM CHLORIDE 0.9 % IV SOLN: 250.0000 mL | INTRAVENOUS | Status: DC | PRN

## 2022-12-15 MED ORDER — FUROSEMIDE 40 MG PO TABS
40.0000 mg | ORAL_TABLET | Freq: Every day | ORAL | Status: AC
  Administered 2022-12-15 – 2022-12-17 (×3): 40 mg via ORAL
  Filled 2022-12-15 (×3): qty 1

## 2022-12-15 MED ORDER — POTASSIUM CHLORIDE CRYS ER 20 MEQ PO TBCR
20.0000 meq | EXTENDED_RELEASE_TABLET | ORAL | Status: AC
  Administered 2022-12-15 (×3): 20 meq via ORAL
  Filled 2022-12-15 (×3): qty 1

## 2022-12-15 MED ORDER — ~~LOC~~ CARDIAC SURGERY, PATIENT & FAMILY EDUCATION: Freq: Once | Status: AC

## 2022-12-15 MED ORDER — TRAMADOL HCL 50 MG PO TABS
50.0000 mg | ORAL_TABLET | ORAL | Status: DC | PRN
  Administered 2022-12-18: 50 mg via ORAL
  Filled 2022-12-15: qty 1

## 2022-12-15 MED ORDER — AMIODARONE HCL 200 MG PO TABS
400.0000 mg | ORAL_TABLET | Freq: Two times a day (BID) | ORAL | Status: DC
  Administered 2022-12-15 – 2022-12-16 (×4): 400 mg via ORAL
  Filled 2022-12-15 (×4): qty 2

## 2022-12-15 MED ORDER — POTASSIUM CHLORIDE CRYS ER 20 MEQ PO TBCR: 20.0000 meq | EXTENDED_RELEASE_TABLET | Freq: Every day | ORAL | Status: DC

## 2022-12-15 MED ORDER — SODIUM CHLORIDE 0.9% FLUSH
3.0000 mL | Freq: Two times a day (BID) | INTRAVENOUS | Status: DC
  Administered 2022-12-15 – 2022-12-17 (×6): 3 mL via INTRAVENOUS

## 2022-12-15 MED ORDER — ASPIRIN 81 MG PO TBEC
81.0000 mg | DELAYED_RELEASE_TABLET | Freq: Every day | ORAL | Status: DC
  Administered 2022-12-15 – 2022-12-18 (×4): 81 mg via ORAL
  Filled 2022-12-15 (×4): qty 1

## 2022-12-15 MED ORDER — APIXABAN 5 MG PO TABS
5.0000 mg | ORAL_TABLET | Freq: Two times a day (BID) | ORAL | Status: DC
  Administered 2022-12-15 – 2022-12-18 (×7): 5 mg via ORAL
  Filled 2022-12-15 (×7): qty 1

## 2022-12-15 MED ORDER — SODIUM CHLORIDE 0.9% FLUSH: 3.0000 mL | INTRAVENOUS | Status: DC | PRN

## 2022-12-15 NOTE — Plan of Care (Signed)
  Problem: Education: Goal: Understanding of CV disease, CV risk reduction, and recovery process will improve Outcome: Progressing Goal: Individualized Educational Video(s) Outcome: Progressing   Problem: Activity: Goal: Ability to return to baseline activity level will improve Outcome: Progressing   Problem: Cardiovascular: Goal: Ability to achieve and maintain adequate cardiovascular perfusion will improve Outcome: Progressing Goal: Vascular access site(s) Level 0-1 will be maintained Outcome: Progressing   Problem: Health Behavior/Discharge Planning: Goal: Ability to safely manage health-related needs after discharge will improve Outcome: Progressing   Problem: Education: Goal: Knowledge of General Education information will improve Description: Including pain rating scale, medication(s)/side effects and non-pharmacologic comfort measures Outcome: Progressing   Problem: Health Behavior/Discharge Planning: Goal: Ability to manage health-related needs will improve Outcome: Progressing   Problem: Clinical Measurements: Goal: Ability to maintain clinical measurements within normal limits will improve Outcome: Progressing Goal: Will remain free from infection Outcome: Progressing Goal: Diagnostic test results will improve Outcome: Progressing Goal: Respiratory complications will improve Outcome: Progressing Goal: Cardiovascular complication will be avoided Outcome: Progressing   Problem: Activity: Goal: Risk for activity intolerance will decrease Outcome: Progressing   Problem: Nutrition: Goal: Adequate nutrition will be maintained Outcome: Progressing   Problem: Coping: Goal: Level of anxiety will decrease Outcome: Progressing   Problem: Elimination: Goal: Will not experience complications related to bowel motility Outcome: Progressing Goal: Will not experience complications related to urinary retention Outcome: Progressing   Problem: Pain Managment: Goal:  General experience of comfort will improve Outcome: Progressing   Problem: Safety: Goal: Ability to remain free from injury will improve Outcome: Progressing   Problem: Skin Integrity: Goal: Risk for impaired skin integrity will decrease Outcome: Progressing   Problem: Education: Goal: Will demonstrate proper wound care and an understanding of methods to prevent future damage Outcome: Progressing Goal: Knowledge of disease or condition will improve Outcome: Progressing Goal: Knowledge of the prescribed therapeutic regimen will improve Outcome: Progressing Goal: Individualized Educational Video(s) Outcome: Progressing   Problem: Activity: Goal: Risk for activity intolerance will decrease Outcome: Progressing   Problem: Cardiac: Goal: Will achieve and/or maintain hemodynamic stability Outcome: Progressing   Problem: Clinical Measurements: Goal: Postoperative complications will be avoided or minimized Outcome: Progressing   Problem: Respiratory: Goal: Respiratory status will improve Outcome: Progressing   Problem: Skin Integrity: Goal: Wound healing without signs and symptoms of infection Outcome: Progressing Goal: Risk for impaired skin integrity will decrease Outcome: Progressing   Problem: Urinary Elimination: Goal: Ability to achieve and maintain adequate renal perfusion and functioning will improve Outcome: Progressing   Problem: Activity: Goal: Ability to tolerate increased activity will improve Outcome: Progressing   Problem: Respiratory: Goal: Ability to maintain a clear airway and adequate ventilation will improve Outcome: Progressing   Problem: Role Relationship: Goal: Method of communication will improve Outcome: Progressing   

## 2022-12-15 NOTE — Progress Notes (Signed)
4 Days Post-Op Procedure(s) (LRB): CORONARY ARTERY BYPASS GRAFTING (CABG)TIMES FOUR UTILIZING LEFT INTERNAL MAMMARY ARTERY AND ENDOSCOPIC VEIN HARVEST RIGHT GREATER SAPHENOUS VEIN (N/A) TRANSESOPHAGEAL ECHOCARDIOGRAM (N/A) Subjective: Some chest wall pain.  Slept well  Ate all of breakfast.  Bowels working well.  Objective: Vital signs in last 24 hours: Temp:  [97.8 F (36.6 C)-98.3 F (36.8 C)] 97.8 F (36.6 C) (09/22 0331) Pulse Rate:  [67-92] 73 (09/22 0800) Cardiac Rhythm: Atrial fibrillation (09/21 2000) Resp:  [12-28] 19 (09/22 0800) BP: (100-134)/(58-103) 112/58 (09/22 0800) SpO2:  [93 %-100 %] 100 % (09/22 0800) Weight:  [105.9 kg] 105.9 kg (09/22 0500)  Hemodynamic parameters for last 24 hours:    Intake/Output from previous day: 09/21 0701 - 09/22 0700 In: 1457.9 [P.O.:800; I.V.:657.9] Out: 3450 [Urine:3450] Intake/Output this shift: No intake/output data recorded.  General appearance: alert and cooperative Neurologic: intact Heart: irregularly irregular rhythm Lungs: clear to auscultation bilaterally Extremities: edema minimal Wound: chest incision healing well. Leg is ok  Lab Results: Recent Labs    12/14/22 0429 12/15/22 0419  WBC 14.5* 11.2*  HGB 8.5* 8.2*  HCT 24.9* 24.0*  PLT 144* 177   BMET:  Recent Labs    12/14/22 0429 12/15/22 0419  NA 133* 136  K 3.7 3.5  CL 104 100  CO2 21* 26  GLUCOSE 131* 107*  BUN 18 20  CREATININE 1.09 0.97  CALCIUM 8.2* 8.4*    PT/INR: No results for input(s): "LABPROT", "INR" in the last 72 hours. ABG    Component Value Date/Time   PHART 7.386 12/11/2022 2246   HCO3 20.4 12/11/2022 2246   TCO2 21 (L) 12/11/2022 2246   ACIDBASEDEF 4.0 (H) 12/11/2022 2246   O2SAT 98 12/11/2022 2246   CBG (last 3)  Recent Labs    12/14/22 0411 12/14/22 0749 12/14/22 1131  GLUCAP 100* 85 95    Assessment/Plan: S/P Procedure(s) (LRB): CORONARY ARTERY BYPASS GRAFTING (CABG)TIMES FOUR UTILIZING LEFT INTERNAL  MAMMARY ARTERY AND ENDOSCOPIC VEIN HARVEST RIGHT GREATER SAPHENOUS VEIN (N/A) TRANSESOPHAGEAL ECHOCARDIOGRAM (N/A)  POD 4 Hemodynamically stable in atrial fib with controlled rate 70's-80's. Will continue Lopressor. Switch amio to po. Start Eliquis per pharmacy.  -2L yesterday and wt now only 3.5 lbs over preop. Continue diuresis for a few more days. Replacing K+  DC sleeve.  Transfer to 4E and continue IS, ambulation.   LOS: 4 days    Alleen Borne 12/15/2022

## 2022-12-15 NOTE — Progress Notes (Signed)
Patient arrived on 4E from 2H.  VSS.  No complaints at this time.    12/15/22 1226  Vitals  Temp 98.4 F (36.9 C)  Temp Source Oral  BP 115/65  MAP (mmHg) 81  BP Location Left Arm  BP Method Automatic  Patient Position (if appropriate) Sitting  Pulse Rate 77  Pulse Rate Source Monitor  Resp 16  Level of Consciousness  Level of Consciousness Alert  MEWS COLOR  MEWS Score Color Green  Oxygen Therapy  SpO2 100 %  O2 Device Room Air  Pain Assessment  Pain Scale 0-10  Pain Score 2  Pain Type Surgical pain  Pain Intervention(s) Repositioned  MEWS Score  MEWS Temp 0  MEWS Systolic 0  MEWS Pulse 0  MEWS RR 0  MEWS LOC 0  MEWS Score 0

## 2022-12-15 NOTE — Progress Notes (Signed)
12/15/2022 Chart reviewed, going to 4E, PCCM available as needed.  Myrla Halsted MD PCCM

## 2022-12-15 NOTE — Progress Notes (Signed)
ANTICOAGULATION CONSULT NOTE  Pharmacy Consult for apixaban Indication: atrial fibrillation  No Known Allergies  Patient Measurements: Height: 5\' 10"  (177.8 cm) Weight: 105.9 kg (233 lb 7.5 oz) IBW/kg (Calculated) : 73   Vital Signs: Temp: 97.8 F (36.6 C) (09/22 0331) Temp Source: Axillary (09/22 0331) BP: 112/58 (09/22 0800) Pulse Rate: 73 (09/22 0800)  Labs: Recent Labs    12/13/22 0422 12/14/22 0429 12/15/22 0419  HGB 9.2* 8.5* 8.2*  HCT 26.5* 24.9* 24.0*  PLT 137* 144* 177  CREATININE 1.04 1.09 0.97    Estimated Creatinine Clearance: 77.8 mL/min (by C-G formula based on SCr of 0.97 mg/dL).   Medical History: Past Medical History:  Diagnosis Date   Arthritis    Coronary artery disease    GERD (gastroesophageal reflux disease)    History of echocardiogram    Echo 4/17 - mild concentric LVH, EF 55-60%, no RWMA, Gr 1 DD, mild AI, mild MR, mild LAE   Hypertension    Kidney stone    Obesity    Tinea corporis      Assessment: 38 yoM s/p OHS c/b POAF. Pharmacy to dose apixaban. Age 86 y/o, wt 105kg, Cr ~1mg /dl so qualifies for 5mg  BID dosing.  Plan: Apixaban 5mg  BID Pharmacy will sign off, reconsult as needed  Fredonia Highland, PharmD, BCPS, [facility] Clinical Pharmacist [phone] Please check AMION for all [facility] numbers 12/15/2022

## 2022-12-16 LAB — BASIC METABOLIC PANEL
Anion gap: 11 (ref 5–15)
BUN: 17 mg/dL (ref 8–23)
CO2: 27 mmol/L (ref 22–32)
Calcium: 8.6 mg/dL — ABNORMAL LOW (ref 8.9–10.3)
Chloride: 100 mmol/L (ref 98–111)
Creatinine, Ser: 1.1 mg/dL (ref 0.61–1.24)
GFR, Estimated: >60 mL/min (ref >60)
Glucose, Bld: 115 mg/dL — ABNORMAL HIGH (ref 70–99)
Potassium: 3.7 mmol/L (ref 3.5–5.1)
Sodium: 138 mmol/L (ref 135–145)

## 2022-12-16 LAB — CBC
HCT: 23.9 % — ABNORMAL LOW (ref 39.0–52.0)
Hemoglobin: 8.2 g/dL — ABNORMAL LOW (ref 13.0–17.0)
MCH: 35 pg — ABNORMAL HIGH (ref 26.0–34.0)
MCHC: 34.3 g/dL (ref 30.0–36.0)
MCV: 102.1 fL — ABNORMAL HIGH (ref 80.0–100.0)
Platelets: 226 10*3/uL (ref 150–400)
RBC: 2.34 MIL/uL — ABNORMAL LOW (ref 4.22–5.81)
RDW: 12.9 % (ref 11.5–15.5)
WBC: 9.6 10*3/uL (ref 4.0–10.5)
nRBC: 0.2 % (ref 0.0–0.2)

## 2022-12-16 LAB — GLUCOSE, CAPILLARY: Glucose-Capillary: 87 mg/dL (ref 70–99)

## 2022-12-16 MED ORDER — AMIODARONE HCL 200 MG PO TABS: ORAL_TABLET | ORAL | 1 refills | Status: DC

## 2022-12-16 MED ORDER — METOPROLOL TARTRATE 25 MG PO TABS: 25.0000 mg | ORAL_TABLET | Freq: Two times a day (BID) | ORAL | 1 refills | Status: DC

## 2022-12-16 MED ORDER — POTASSIUM CHLORIDE CRYS ER 20 MEQ PO TBCR
30.0000 meq | EXTENDED_RELEASE_TABLET | Freq: Two times a day (BID) | ORAL | Status: AC
  Administered 2022-12-16 (×2): 30 meq via ORAL
  Filled 2022-12-16 (×2): qty 1

## 2022-12-16 MED ORDER — FUROSEMIDE 40 MG PO TABS: 40.0000 mg | ORAL_TABLET | Freq: Every day | ORAL | 0 refills | Status: DC

## 2022-12-16 MED ORDER — APIXABAN 5 MG PO TABS: 5.0000 mg | ORAL_TABLET | Freq: Two times a day (BID) | ORAL | 0 refills | Status: DC

## 2022-12-16 MED ORDER — ASPIRIN 81 MG PO TBEC: 81.0000 mg | DELAYED_RELEASE_TABLET | Freq: Every day | ORAL | Status: AC

## 2022-12-16 MED ORDER — OXYCODONE HCL 5 MG PO TABS: 5.0000 mg | ORAL_TABLET | Freq: Four times a day (QID) | ORAL | 0 refills | Status: DC | PRN

## 2022-12-16 MED ORDER — ATORVASTATIN CALCIUM 40 MG PO TABS: 40.0000 mg | ORAL_TABLET | Freq: Every day | ORAL | 1 refills | Status: DC

## 2022-12-16 MED ORDER — POTASSIUM CHLORIDE CRYS ER 20 MEQ PO TBCR
20.0000 meq | EXTENDED_RELEASE_TABLET | Freq: Every day | ORAL | Status: DC
  Administered 2022-12-17 – 2022-12-18 (×2): 20 meq via ORAL
  Filled 2022-12-16 (×2): qty 1

## 2022-12-16 MED ORDER — POTASSIUM CHLORIDE CRYS ER 20 MEQ PO TBCR: 20.0000 meq | EXTENDED_RELEASE_TABLET | Freq: Every day | ORAL | 0 refills | Status: DC

## 2022-12-16 MED ORDER — PANTOPRAZOLE SODIUM 40 MG PO TBEC: 40.0000 mg | DELAYED_RELEASE_TABLET | Freq: Every day | ORAL | Status: DC

## 2022-12-16 MED ORDER — SODIUM CHLORIDE 0.9 % IV SOLN: INTRAVENOUS | Status: DC | PRN

## 2022-12-16 NOTE — Care Management Important Message (Signed)
Important Message  Patient Details  Name: Erik [name] MRN: [id number] Date of Birth: March 31, 1945   Medicare Important Message Given:  Yes     Sherilyn Banker 12/16/2022, 2:34 PM

## 2022-12-16 NOTE — Progress Notes (Signed)
CARDIAC REHAB PHASE I   Pt resting in bed, feeling well today. Pt assisted to side of bed, ambulated to bathroom for BM, using front wheel walker(pt would like to try using rollator with next walk). Pt mobilizing well, using proper sternal precautions. Pt declined walk in hallway and sitting in chair, due to right hip pain. Pt assisted back to bed with call bell and bedside table in reach. Pt asked RN for pain medication. Encouraged mobility, oob to chair and IS use today. Will return to offer walk later today as time permits. Will continue to follow.   [phone] Woodroe Chen, RN BSN 12/16/2022 10:19 AM

## 2022-12-16 NOTE — Progress Notes (Addendum)
      [address]       [place] [address]             [phone]        5 Days Post-Op Procedure(s) (LRB): CORONARY ARTERY BYPASS GRAFTING (CABG)TIMES FOUR UTILIZING LEFT INTERNAL MAMMARY ARTERY AND ENDOSCOPIC VEIN HARVEST RIGHT GREATER SAPHENOUS VEIN (N/A) TRANSESOPHAGEAL ECHOCARDIOGRAM (N/A)  Subjective: Patient sleeping and awakened. He has no specific complaint this am  Objective: Vital signs in last 24 hours: Temp:  [98.1 F (36.7 C)-99.1 F (37.3 C)] 99.1 F (37.3 C) (09/23 0524) Pulse Rate:  [71-117] 72 (09/23 0524) Cardiac Rhythm: Normal sinus rhythm (09/22 1945) Resp:  [16-22] 18 (09/23 0525) BP: (100-131)/(58-107) 121/65 (09/23 0524) SpO2:  [94 %-100 %] 100 % (09/23 0524) Weight:  [103.8 kg] 103.8 kg (09/23 0500)  Pre op weight 104.3 kg Current Weight  12/16/22 103.8 kg      Intake/Output from previous day: 09/22 0701 - 09/23 0700 In: 215.4 [P.O.:120; I.V.:95.4] Out: 2275 [Urine:2275]   Physical Exam:  Cardiovascular: [facility] Pulmonary: Clear to auscultation bilaterally Abdomen: Soft, non tender, bowel sounds present. Extremities: Mild bilateral lower extremity edema. Mild ecchymosis right thigh Wounds: Clean and dry.  No erythema or signs of infection.  Lab Results: CBC: Recent Labs    12/15/22 0419 12/16/22 0319  WBC 11.2* 9.6  HGB 8.2* 8.2*  HCT 24.0* 23.9*  PLT 177 226   BMET:  Recent Labs    12/15/22 0419 12/16/22 0319  NA 136 138  K 3.5 3.7  CL 100 100  CO2 26 27  GLUCOSE 107* 115*  BUN 20 17  CREATININE 0.97 1.10  CALCIUM 8.4* 8.6*    PT/INR:  Lab Results  Component Value Date   INR 1.5 (H) 12/11/2022   INR 1.0 12/09/2022   INR 1.04 06/30/2015   ABG:  INR: Will add last result for INR, ABG once components are confirmed Will add last 4 CBG results once components are confirmed  Assessment/Plan:  1. CV - Previous a fib. SR this am. On Amiodarone 400 mg bid , Lopressor 25 mg bid, and  Apixaban. 2.  Pulmonary - On room air. Encourage incentive spirometer 3. Volume Overload - On Lasix 40 mg daily 4.  Expected post op acute blood loss anemia - H and H this am stable at 8.2 and 23.9. Continue Ferrous sulfate 5. Supplement potassium 6. Low grade fever to 99.1. Likely related to atelectasis. No sign of infection. 7. Deconditioned-continue to ambulate at least bid 8. Disposition-home soon  Donielle M ZimmermanPA-C 7:00 AM   Agree with above In and out of sinus Sinus this am, on eliquis Dispo planning  Mardel Grudzien O Chynna Buerkle

## 2022-12-16 NOTE — Progress Notes (Signed)
Mobility Specialist Progress Note:   12/16/22 1252  Mobility  Activity Ambulated with assistance in hallway  Level of Assistance Contact guard assist, steadying assist  Assistive Device Front wheel walker  Distance Ambulated (ft) 310 ft  RUE Weight Bearing NWB  LUE Weight Bearing NWB  Activity Response Tolerated well  Mobility Referral Yes  $Mobility charge 1 Mobility  Mobility Specialist Start Time (ACUTE ONLY) 1215  Mobility Specialist Stop Time (ACUTE ONLY) 1236  Mobility Specialist Time Calculation (min) (ACUTE ONLY) 21 min   During Mobility: 112 HR  Post Mobility: 93 HR   Pt received EOB with NT, agreeable to mobility. SB to stand. CG during ambulation. Pt denied any discomfort during ambulation, asymptomatic throughout. Pt left in chair with call bell in reach and all needs met.   Erik [name]  Mobility Specialist Please contact via [employer] office at [phone]

## 2022-12-16 NOTE — Progress Notes (Signed)
Mobility Specialist Progress Note:   12/16/22 1628  Mobility  Activity Ambulated with assistance in hallway  Level of Assistance Contact guard assist, steadying assist  Assistive Device Front wheel walker  Distance Ambulated (ft) 425 ft  RUE Weight Bearing NWB  LUE Weight Bearing NWB  Activity Response Tolerated well  Mobility Referral Yes  $Mobility charge 1 Mobility  Mobility Specialist Start Time (ACUTE ONLY) 1605  Mobility Specialist Stop Time (ACUTE ONLY) 1625  Mobility Specialist Time Calculation (min) (ACUTE ONLY) 20 min   Pre Mobility: 80 HR  During Mobility: 106 HR Post Mobility: 83 HR  Pt received in bed, agreeable to mobility. C/o RLE pain prior to ambulation, otherwise asymptomatic throughout. Pt returned to bed with call bell in reach and all needs met.   Erik [name]  Mobility Specialist Please contact via [employer] office at [phone]

## 2022-12-16 NOTE — Progress Notes (Signed)
Mobility Specialist Progress Note:   12/16/22 1247  Mobility  Activity Ambulated with assistance in room  Level of Assistance Standby assist, set-up cues, supervision of patient - no hands on  Assistive Device Front wheel walker  Distance Ambulated (ft) 30 ft  RUE Weight Bearing NWB  LUE Weight Bearing NWB  Activity Response Tolerated well  Mobility Referral Yes  $Mobility charge 1 Mobility  Mobility Specialist Start Time (ACUTE ONLY) 1148  Mobility Specialist Stop Time (ACUTE ONLY) 1158  Mobility Specialist Time Calculation (min) (ACUTE ONLY) 10 min   During Mobility: 100 HR  Pt received in bed, agreeable to mobility. Prior to standing pt requesting to use BR. SB assist to stand and ambulate. Pt left in BR with all needs met and instructed to call when ready.   Leory Plowman  Mobility Specialist Please contact via [employer] office at [phone]

## 2022-12-17 ENCOUNTER — Other Ambulatory Visit (HOSPITAL_COMMUNITY): Payer: Self-pay

## 2022-12-17 MED ORDER — AMIODARONE HCL 200 MG PO TABS: ORAL_TABLET | ORAL | 1 refills | Status: DC

## 2022-12-17 MED ORDER — AMIODARONE HCL 200 MG PO TABS
200.0000 mg | ORAL_TABLET | Freq: Two times a day (BID) | ORAL | Status: DC
  Administered 2022-12-17 – 2022-12-18 (×3): 200 mg via ORAL
  Filled 2022-12-17 (×3): qty 1

## 2022-12-17 NOTE — Evaluation (Signed)
Physical Therapy Evaluation Patient Details Name: Erik [name] MRN: [id number] DOB: 1945-10-21 Today's Date: 12/17/2022  History of Present Illness  Patient is a 77 y/o male admitted 12/11/22 for CABG x 4 due to CAD.  PMH positive for arthritis, CAD, GERD, HTN, obesity, tinea corporis, kidney stone.  Clinical Impression  Patient presents with decreased mobility due to decreased activity tolerance, decreased balance and generalized weakness.  He was previously independent performing IADL's and driving.  Currently needing S for safety with bed mobility, transfers and ambulation needing education for using rollator.  Patient able to negotiate stairs appropriate for home entry/access with S to CGA.  Feel he should be able to maneuver without PT follow up though pt requesting HH aide to assist pt/spouse with bath as has tub.  Declines follow up PT and feel stable without it.  Pt. Stable for home with family support and no follow up PT at this time.  Will sign off.       If plan is discharge home, recommend the following: Assistance with cooking/housework;Help with stairs or ramp for entrance;Assist for transportation;Direct supervision/assist for medications management   Can travel by private vehicle        Equipment Recommendations Rollator (4 wheels)  Recommendations for Other Services       Functional Status Assessment Patient has had a recent decline in their functional status and demonstrates the ability to make significant improvements in function in a reasonable and predictable amount of time.     Precautions / Restrictions Precautions Precautions: Fall;Sternal Restrictions RUE Weight Bearing: Non weight bearing LUE Weight Bearing: Non weight bearing      Mobility  Bed Mobility Overal bed mobility: Needs Assistance Bed Mobility: Rolling, Sidelying to Sit, Sit to Sidelying Rolling: Supervision Sidelying to sit: Supervision, Used rails, HOB elevated     Sit to sidelying: Used  rails, Supervision General bed mobility comments: increased time and effort though using R hand on R rail not to pull or push but to support to upright, same to supine    Transfers Overall transfer level: Needs assistance Equipment used: Rollator (4 wheels) Transfers: Sit to/from Stand Sit to Stand: From elevated surface, Contact guard assist           General transfer comment: up to stand x 2 reps using momentum with arms across chest    Ambulation/Gait Ambulation/Gait assistance: Supervision Gait Distance (Feet): 150 Feet Assistive device: Rollator (4 wheels) Gait Pattern/deviations: Step-through pattern, Trunk flexed, Decreased stride length, Wide base of support       General Gait Details: mild trunk flexion and pt determining use of rollator with cues for break usage, etc;  Stairs Stairs: Yes Stairs assistance: Contact guard assist, Supervision Stair Management: One rail Left, Alternating pattern, Step to pattern, Forwards Number of Stairs: 10 General stair comments: cues for step to sequence as pt initiated with step through for energy conservation/safety; also demonstrated side stepping two hands to one rail if needed  Wheelchair Mobility     Tilt Bed    Modified Rankin (Stroke Patients Only)       Balance                                             Pertinent Vitals/Pain Pain Assessment Pain Assessment: Faces Faces Pain Scale: Hurts a little bit Pain Location: R hip Pain Descriptors / Indicators: Aching Pain Intervention(s):  Monitored during session    Home Living Family/patient expects to be discharged to:: Private residence Living Arrangements: Spouse/significant other Available Help at Discharge: Family Type of Home: House Home Access: Stairs to enter Entrance Stairs-Rails: Doctor, general practice of Steps: 5 Alternate Level Stairs-Number of Steps: 14 Home Layout: Two level;Bed/bath upstairs Home Equipment:  Shower seat      Prior Function Prior Level of Function : Independent/Modified Independent;Driving               ADLs Comments: did some of the yardwork     Extremity/Trunk Assessment   Upper Extremity Assessment Upper Extremity Assessment: RUE deficits/detail;LUE deficits/detail RUE Deficits / Details: generally WFL within sternal precautions, reports finger numbness and quickly changes color when cold or when blood taked new since surgery LUE Deficits / Details: generally WFL within sternal precautions, reports finger numbness and quickly changes color when cold or when blood taked new since surgery    Lower Extremity Assessment Lower Extremity Assessment: RLE deficits/detail RLE Deficits / Details: AROM WFL, strength hip flexion 3/5, knee extension 4/5, ankel DF 5/5; medial knee with glued incision from vein harvest    Cervical / Trunk Assessment Cervical / Trunk Assessment: Kyphotic  Communication   Communication Communication: No apparent difficulties  Cognition Arousal: Alert Behavior During Therapy: WFL for tasks assessed/performed Overall Cognitive Status: Within Functional Limits for tasks assessed                                          General Comments      Exercises     Assessment/Plan    PT Assessment Patient does not need any further PT services  PT Problem List         PT Treatment Interventions      PT Goals (Current goals can be found in the Care Plan section)  Acute Rehab PT Goals Patient Stated Goal: return home PT Goal Formulation: All assessment and education complete, DC therapy    Frequency       Co-evaluation               AM-PAC PT "6 Clicks" Mobility  Outcome Measure Help needed turning from your back to your side while in a flat bed without using bedrails?: A Little Help needed moving from lying on your back to sitting on the side of a flat bed without using bedrails?: A Little Help needed moving to  and from a bed to a chair (including a wheelchair)?: A Little Help needed standing up from a chair using your arms (e.g., wheelchair or bedside chair)?: A Little Help needed to walk in hospital room?: None Help needed climbing 3-5 steps with a railing? : A Little 6 Click Score: 19    End of Session Equipment Utilized During Treatment: Gait belt Activity Tolerance: Patient tolerated treatment well Patient left: in bed   PT Visit Diagnosis: Difficulty in walking, not elsewhere classified (R26.2)    Time: 1354-1420 PT Time Calculation (min) (ACUTE ONLY): 26 min   Charges:   PT Evaluation $PT Eval Moderate Complexity: 1 Mod PT Treatments $Gait Training: 8-22 mins PT General Charges $$ ACUTE PT VISIT: 1 Visit         Sheran Lawless, PT Acute Rehabilitation Services Office:[phone] 12/17/2022   Elray Mcgregor 12/17/2022, 2:32 PM

## 2022-12-17 NOTE — Progress Notes (Signed)
      [address]       [place] [address]             [phone]        6 Days Post-Op Procedure(s) (LRB): CORONARY ARTERY BYPASS GRAFTING (CABG)TIMES FOUR UTILIZING LEFT INTERNAL MAMMARY ARTERY AND ENDOSCOPIC VEIN HARVEST RIGHT GREATER SAPHENOUS VEIN (N/A) TRANSESOPHAGEAL ECHOCARDIOGRAM (N/A)  Subjective: Patient slept well. He did walk two times yesterday.  Objective: Vital signs in last 24 hours: Temp:  [98.1 F (36.7 C)-99.3 F (37.4 C)] 99 F (37.2 C) (09/24 0522) Pulse Rate:  [80-83] 82 (09/24 0522) Cardiac Rhythm: Normal sinus rhythm;Bundle branch block (09/23 1900) Resp:  [16-18] 16 (09/24 0522) BP: (101-134)/(61-81) 133/68 (09/24 0522) SpO2:  [97 %-100 %] 97 % (09/24 0522) Weight:  [103.9 kg] 103.9 kg (09/24 0552)  Pre op weight 104.3 kg Current Weight  12/17/22 103.9 kg      Intake/Output from previous day: 09/23 0701 - 09/24 0700 In: 440 [P.O.:440] Out: 2700 [Urine:2700]   Physical Exam:  Cardiovascular: IRRR IRRR Pulmonary: Clear to auscultation bilaterally Abdomen: Soft, non tender, bowel sounds present. Extremities: Mild bilateral lower extremity edema. Mild ecchymosis right thigh Wounds: Clean and dry.  No erythema or signs of infection.  Lab Results: CBC: Recent Labs    12/15/22 0419 12/16/22 0319  WBC 11.2* 9.6  HGB 8.2* 8.2*  HCT 24.0* 23.9*  PLT 177 226   BMET:  Recent Labs    12/15/22 0419 12/16/22 0319  NA 136 138  K 3.5 3.7  CL 100 100  CO2 26 27  GLUCOSE 107* 115*  BUN 20 17  CREATININE 0.97 1.10  CALCIUM 8.4* 8.6*    PT/INR:  Lab Results  Component Value Date   INR 1.5 (H) 12/11/2022   INR 1.0 12/09/2022   INR 1.04 06/30/2015   ABG:  INR: Will add last result for INR, ABG once components are confirmed Will add last 4 CBG results once components are confirmed  Assessment/Plan:  1. CV - Previous a fib. SR this am. On Amiodarone 400 mg bid , Lopressor 25 mg bid, and Apixaban. QTc high  400's to low 500's so will decrease Amiodarone 2.  Pulmonary - On room air. Encourage incentive spirometer 3. Volume Overload - On Lasix 40 mg daily 4.  Expected post op acute blood loss anemia - H and H this am stable at 8.2 and 23.9. Continue Ferrous sulfate 5. Deconditioned-continue to increase ambulation 6. Disposition-will discuss with Dr. Lorn Junes M ZimmermanPA-C 7:24 AM

## 2022-12-17 NOTE — Progress Notes (Signed)
Mobility Specialist Progress Note:   12/17/22 1159  Mobility  Activity Ambulated with assistance in hallway  Level of Assistance Contact guard assist, steadying assist  Assistive Device Front wheel walker  Distance Ambulated (ft) 470 ft  RUE Weight Bearing NWB  LUE Weight Bearing NWB  Activity Response Tolerated well  Mobility Referral Yes  $Mobility charge 1 Mobility  Mobility Specialist Start Time (ACUTE ONLY) 0915  Mobility Specialist Stop Time (ACUTE ONLY) 0935  Mobility Specialist Time Calculation (min) (ACUTE ONLY) 20 min   Pre Mobility: 90 HR  During Mobility: 112 HR  Post Mobility: 92 HR  Pt received in chair, agreeable to mobility. 2x failed attempts to stand independently within precautions from chair. Pt c/o LLE pain prior to standing, otherwise asymptomatic throughout. Pt declining to return to chair stating "it hurts my lower back." Pt let in bed with call bell in reach and all needs met. Family present.     Leory Plowman  Mobility Specialist Please contact via [employer] office at [phone]

## 2022-12-17 NOTE — Progress Notes (Signed)
CARDIAC REHAB PHASE I    Pt ambulated with mobility this morning. Tolerated well, using front wheel walker. Pt would like to try rollator with next walk, to compare. Pt and wife concerned regarding stairs at home. PT to see and evaluate use of stairs. Post OHS education including site care, restrictions, heart healthy diet, sternal precautions, IS use at home, home needs at discharge, exercise guidelines and CRP2 reviewed. All questions and concerns addressed. Will refer to [facility] for CRP2.   [id number] Woodroe Chen, RN BSN 12/17/2022 11:45 AM

## 2022-12-18 ENCOUNTER — Other Ambulatory Visit (HOSPITAL_COMMUNITY): Payer: Self-pay

## 2022-12-18 MED FILL — Electrolyte-R (PH 7.4) Solution: INTRAVENOUS | Qty: 5000 | Status: AC

## 2022-12-18 MED FILL — Calcium Chloride Inj 10%: INTRAVENOUS | Qty: 10 | Status: AC

## 2022-12-18 MED FILL — Albumin, Human Inj 5%: INTRAVENOUS | Qty: 250 | Status: AC

## 2022-12-18 MED FILL — Sodium Bicarbonate IV Soln 8.4%: INTRAVENOUS | Qty: 50 | Status: AC

## 2022-12-18 MED FILL — Sodium Chloride IV Soln 0.9%: INTRAVENOUS | Qty: 2000 | Status: AC

## 2022-12-18 MED FILL — Heparin Sodium (Porcine) Inj 1000 Unit/ML: INTRAMUSCULAR | Qty: 10 | Status: AC

## 2022-12-18 NOTE — Progress Notes (Signed)
CARDIAC REHAB PHASE I   Pt ready for discharge home. DME in room. All education completed. Referral for CRP2 sent to [facility].   [id number]  Woodroe Chen, RN BSN 12/18/2022 11:50 AM

## 2022-12-18 NOTE — Progress Notes (Signed)
      [address]       [place] [address]             [phone]      7 Days Post-Op Procedure(s) (LRB): CORONARY ARTERY BYPASS GRAFTING (CABG)TIMES FOUR UTILIZING LEFT INTERNAL MAMMARY ARTERY AND ENDOSCOPIC VEIN HARVEST RIGHT GREATER SAPHENOUS VEIN (N/A) TRANSESOPHAGEAL ECHOCARDIOGRAM (N/A) Subjective: Pt states he has back and hip pain due to arthritis, states this is similar to his normal arthritis pain. Denies chest pain and shortness of breath.   Objective: Vital signs in last 24 hours: Temp:  [97.7 F (36.5 C)-99 F (37.2 C)] 98.6 F (37 C) (09/25 0359) Pulse Rate:  [78-97] 84 (09/25 0359) Cardiac Rhythm: Normal sinus rhythm;Bundle branch block (09/24 1900) Resp:  [16-20] 20 (09/25 0359) BP: (116-138)/(59-74) 127/60 (09/25 0359) SpO2:  [98 %-100 %] 98 % (09/25 0359) Weight:  [102.2 kg] 102.2 kg (09/25 0359)  Hemodynamic parameters for last 24 hours:    Intake/Output from previous day: 09/24 0701 - 09/25 0700 In: 1200 [P.O.:1200] Out: 3500 [Urine:3500] Intake/Output this shift: No intake/output data recorded.  General appearance: alert, cooperative, and no distress Neurologic: intact Heart: regular rate and rhythm, S1, S2 normal, no murmur, click, rub or gallop Lungs: clear to auscultation bilaterally Abdomen: soft, non-tender; bowel sounds normal; no masses,  no organomegaly Extremities: edema trace Wound: Clean and dry, no erythema or sign of infection  Lab Results: Recent Labs    12/16/22 0319  WBC 9.6  HGB 8.2*  HCT 23.9*  PLT 226   BMET:  Recent Labs    12/16/22 0319  NA 138  K 3.7  CL 100  CO2 27  GLUCOSE 115*  BUN 17  CREATININE 1.10  CALCIUM 8.6*    PT/INR: No results for input(s): "LABPROT", "INR" in the last 72 hours. ABG    Component Value Date/Time   PHART 7.386 12/11/2022 2246   HCO3 20.4 12/11/2022 2246   TCO2 21 (L) 12/11/2022 2246   ACIDBASEDEF 4.0 (H) 12/11/2022 2246   O2SAT 98 12/11/2022 2246   CBG  (last 3)  Recent Labs    12/16/22 1546  GLUCAP 87    Assessment/Plan: S/P Procedure(s) (LRB): CORONARY ARTERY BYPASS GRAFTING (CABG)TIMES FOUR UTILIZING LEFT INTERNAL MAMMARY ARTERY AND ENDOSCOPIC VEIN HARVEST RIGHT GREATER SAPHENOUS VEIN (N/A) TRANSESOPHAGEAL ECHOCARDIOGRAM (N/A)  CV: Hx of afib. On Amiodarone 200mg  BID, decreased yesterday due to elevated QTC. QTC low 500s this AM. On Apixaban. On Lopressor 25mg  BID. BP stable, NSR HR 80s.   Pulm: Saturating well on RA. Encourage IS and ambulation  GI: +BM  Endo: No hx of DM, SSI and CBGs d/c'd  Renal: Cr stable 1.1. Good UO. Under preop weight. On Lasix 40mg  daily and K supplement, will d/c.   ID: Reactive leukocytosis resolved  Expected postop ABLA: Last H/H 8.2/23.9 stable, on iron supplement.   Dispo: Likely d/c to home today   LOS: 7 days    Jenny Reichmann, PA-C 12/18/2022

## 2022-12-18 NOTE — TOC Transition Note (Signed)
Transition of Care [facility]) - CM/SW Discharge Note   Patient Details  Name: Erik [name] MRN: [id number] Date of Birth: February 20, 1946  Transition of Care [facility]) CM/SW Contact:  Lawerance Sabal, RN Phone Number: 12/18/2022, 8:34 AM   Clinical Narrative:     Rollator and 3/1 will be delivered to room today for DC.  Patient provided with 30 day Eliquis card        Patient Goals and CMS Choice      Discharge Placement                         Discharge Plan and Services Additional resources added to the After Visit Summary for     Discharge Planning Services: CM Consult            DME Arranged: 3-N-1 DME Agency: [employer] Date DME Agency Contacted: 12/18/22 Time DME Agency Contacted: [phone] Representative spoke with at DME Agency: Vaughan Basta HH Arranged: NA          Social Determinants of Health (SDOH) Interventions SDOH Screenings   Food Insecurity: No Food Insecurity (12/15/2022)  Housing: Low Risk  (12/15/2022)  Transportation Needs: No Transportation Needs (12/15/2022)  Utilities: Not At Risk (12/15/2022)  Tobacco Use: Medium Risk (12/11/2022)     Readmission Risk Interventions     No data to display

## 2022-12-19 ENCOUNTER — Other Ambulatory Visit: Payer: Self-pay

## 2022-12-19 ENCOUNTER — Other Ambulatory Visit: Payer: Self-pay | Admitting: Physician Assistant

## 2022-12-19 ENCOUNTER — Other Ambulatory Visit: Payer: Self-pay | Admitting: Cardiothoracic Surgery

## 2022-12-19 ENCOUNTER — Telehealth: Payer: Self-pay

## 2022-12-19 DIAGNOSIS — Z951 Presence of aortocoronary bypass graft: Secondary | ICD-10-CM

## 2022-12-19 MED ORDER — PREDNISONE 5 MG PO TABS
ORAL_TABLET | ORAL | 0 refills | Status: DC
Start: 2022-12-19 — End: 2022-12-19

## 2022-12-19 MED ORDER — PREDNISONE 5 MG PO TABS: ORAL_TABLET | ORAL | 0 refills | Status: DC

## 2022-12-19 NOTE — Progress Notes (Signed)
This encounter was created in error - please disregard.

## 2022-12-19 NOTE — Telephone Encounter (Signed)
Patient was recently discharged following multivessel CABG by Dr. Cliffton Asters.  Postoperatively he is on Eliquis and cannot take his meloxicam.  He called the office this a.m. with complaints of severe foot/great toe pain associated with generalized arthritic pain.  He has a history of gout in his past medical diagnosis list.  He will be given a short course of prednisone taper , 25, 20, 15, 10, 5 mg daily.  If he does not improve he will let our office know and we will arrange an office evaluation tomorrow.  Lovett Sox MD

## 2022-12-19 NOTE — Telephone Encounter (Signed)
Patient's wife contacted the office with concerns after patient was discharged yesterday following CABG with Dr. Cliffton Asters. She states that he is in terrible pain with back, hands, and his ankle. She states that he is having trouble getting up to do everyday tasks and going to the bathroom. She states that she is having difficulties taking care of him. She states that his chest incision is not causing any pain. He is taking the prescribed Norco that he usually takes with his pain management physician, but he does have Oxycodone 5mg  that was prescribed. She is inquiring if this is ok to start instead of the Norco. He does not have home health as he only requested an aid to help get into and out of the tub. Spoke with Dr. Donata Clay who advised patient take a prednisone taper for gouty arthritis. He also states that patient can start Oxy 5mg  and stop Norco. A referral was sent into [facility] to see if [facility] and PT would work with him. Still waiting for review. Advised patient should also follow-up with his PCP regarding chronic pain and gout. She acknowledged receipt. She states that he does have an appt to see PCP 10/7. Advised that if he was not better he could come into the office tomorrow to see Dr. Donata Clay per his request. She acknowledged receipt.

## 2022-12-20 ENCOUNTER — Telehealth: Payer: Self-pay

## 2022-12-20 NOTE — Telephone Encounter (Signed)
Spoke with Erik [name] at [facility] nursing is to go out to patient's home on Sunday 9/29, for evaluation. States patient's wife [name] is aware.

## 2022-12-20 NOTE — Telephone Encounter (Signed)
Contacted patient's wife today to check in to see how he was doing. She is still very upset as she feels he was discharged from the hospital too soon. She states that a nurse case manager is at the home today while on the phone. She states that the prednisone taper started last night has not helped as of yet. She is currently giving him the Oxycodone instead of his Norco. She states that as long as she does not touch him. If he is touched, he screams in pain. Pain is located in his back, hands, and ankle. [facility] was contacted again this morning, spoke with Irish Lack, she states that patient's information is still in review for home health nursing/PT. Will send to another home health agency if [facility] is not willing to accept.

## 2022-12-22 DIAGNOSIS — Z48812 Encounter for surgical aftercare following surgery on the circulatory system: Secondary | ICD-10-CM | POA: Diagnosis not present

## 2022-12-23 ENCOUNTER — Other Ambulatory Visit: Payer: Self-pay | Admitting: *Deleted

## 2022-12-23 MED ORDER — FUROSEMIDE 40 MG PO TABS: 40.0000 mg | ORAL_TABLET | Freq: Every day | ORAL | 0 refills | Status: DC

## 2022-12-23 MED ORDER — POTASSIUM CHLORIDE CRYS ER 20 MEQ PO TBCR: 20.0000 meq | EXTENDED_RELEASE_TABLET | Freq: Every day | ORAL | 0 refills | Status: DC

## 2022-12-23 NOTE — Progress Notes (Signed)
Patient's wife contacted the office stating patient's left foot is not getting any better. States the prednisone helped for a few days but today it is back to being swollen, red, and painful. States HHRN came and saw the patient yesterday and said it could be cellulitis. Wife states foot is hot to touch. Advised wife patient can be seen today by Dr. Donata Clay. Wife states patient is unable to get down the stairs as he can not bear weight on foot. Wife sent photos in to be seen and reviewed by Dr. Donata Clay. Advised wife that area of concern does not look like cellulitis. Advised wife to stop giving patient the prednisone. Refilled lasix and potassium for 10 additional days. Advised to try compression socks on legs to help with swelling. Advised if area begins to worsen, to contact our office for an appt. Wife acknowledge instructions given and verbalized her understanding.

## 2022-12-26 ENCOUNTER — Other Ambulatory Visit: Payer: Self-pay | Admitting: Physician Assistant

## 2022-12-26 ENCOUNTER — Telehealth: Payer: Self-pay

## 2022-12-26 MED ORDER — OXYCODONE HCL 5 MG PO TABS
5.0000 mg | ORAL_TABLET | ORAL | 0 refills | Status: AC | PRN
Start: 2022-12-26 — End: 2023-01-02

## 2022-12-26 NOTE — Progress Notes (Unsigned)
Erik [name] wife called the office reporting her husband was continuing to have pain but had only two pain pills left.  An Rx for oxyIR 5mg  q4h prn pain #30 was sent to his pharmacy.   He has scheduled virtual follow up with Dr. Cliffton Asters tomorrow and with his PCP on Monday, 12/30/22.  Gaynelle Arabian, PA-C

## 2022-12-26 NOTE — Telephone Encounter (Signed)
Roe Coombs, PT with [facility] contacted the office requesting verbal orders for home health physical therapy for 2x/wk for 1wk, 1x/wk for 2 wks, and 1x/wk for 2 wks for functioning/mobility, safety, exercise, mobility, pain control, and health promotion. Verbal orders given.

## 2022-12-27 ENCOUNTER — Other Ambulatory Visit: Payer: Self-pay

## 2022-12-27 ENCOUNTER — Inpatient Hospital Stay (HOSPITAL_COMMUNITY)
Admission: EM | Admit: 2022-12-27 | Discharge: 2022-12-29 | DRG: 093 | Disposition: A | Discharge status: Home Health | Payer: Medicare Other | Attending: Thoracic Surgery (Cardiothoracic Vascular Surgery) | Admitting: Thoracic Surgery (Cardiothoracic Vascular Surgery)

## 2022-12-27 ENCOUNTER — Emergency Department (HOSPITAL_COMMUNITY): Payer: Medicare Other

## 2022-12-27 ENCOUNTER — Encounter (HOSPITAL_COMMUNITY): Payer: Self-pay | Admitting: Emergency Medicine

## 2022-12-27 ENCOUNTER — Ambulatory Visit (INDEPENDENT_AMBULATORY_CARE_PROVIDER_SITE_OTHER): Payer: Self-pay | Admitting: Thoracic Surgery (Cardiothoracic Vascular Surgery)

## 2022-12-27 DIAGNOSIS — G8918 Other acute postprocedural pain: Principal | ICD-10-CM

## 2022-12-27 DIAGNOSIS — R0789 Other chest pain: Secondary | ICD-10-CM | POA: Diagnosis present

## 2022-12-27 DIAGNOSIS — G8929 Other chronic pain: Principal | ICD-10-CM | POA: Diagnosis present

## 2022-12-27 DIAGNOSIS — M19072 Primary osteoarthritis, left ankle and foot: Secondary | ICD-10-CM | POA: Diagnosis present

## 2022-12-27 DIAGNOSIS — Z9889 Other specified postprocedural states: Secondary | ICD-10-CM

## 2022-12-27 DIAGNOSIS — M109 Gout, unspecified: Secondary | ICD-10-CM | POA: Diagnosis present

## 2022-12-27 DIAGNOSIS — R5381 Other malaise: Secondary | ICD-10-CM

## 2022-12-27 DIAGNOSIS — M79672 Pain in left foot: Secondary | ICD-10-CM | POA: Diagnosis present

## 2022-12-27 DIAGNOSIS — R627 Adult failure to thrive: Secondary | ICD-10-CM | POA: Diagnosis present

## 2022-12-27 DIAGNOSIS — Z79899 Other long term (current) drug therapy: Secondary | ICD-10-CM

## 2022-12-27 DIAGNOSIS — D72829 Elevated white blood cell count, unspecified: Secondary | ICD-10-CM | POA: Diagnosis present

## 2022-12-27 DIAGNOSIS — Z7901 Long term (current) use of anticoagulants: Secondary | ICD-10-CM

## 2022-12-27 DIAGNOSIS — M79605 Pain in left leg: Secondary | ICD-10-CM

## 2022-12-27 DIAGNOSIS — M19031 Primary osteoarthritis, right wrist: Secondary | ICD-10-CM | POA: Diagnosis present

## 2022-12-27 DIAGNOSIS — R531 Weakness: Secondary | ICD-10-CM

## 2022-12-27 DIAGNOSIS — R0602 Shortness of breath: Secondary | ICD-10-CM | POA: Diagnosis present

## 2022-12-27 DIAGNOSIS — Z7982 Long term (current) use of aspirin: Secondary | ICD-10-CM

## 2022-12-27 DIAGNOSIS — Z951 Presence of aortocoronary bypass graft: Secondary | ICD-10-CM

## 2022-12-27 DIAGNOSIS — M25531 Pain in right wrist: Secondary | ICD-10-CM | POA: Diagnosis present

## 2022-12-27 DIAGNOSIS — R262 Difficulty in walking, not elsewhere classified: Secondary | ICD-10-CM | POA: Diagnosis present

## 2022-12-27 DIAGNOSIS — I10 Essential (primary) hypertension: Secondary | ICD-10-CM | POA: Diagnosis present

## 2022-12-27 LAB — CBC
HCT: 33.3 % — ABNORMAL LOW (ref 39.0–52.0)
Hemoglobin: 10.8 g/dL — ABNORMAL LOW (ref 13.0–17.0)
MCH: 33.6 pg (ref 26.0–34.0)
MCHC: 32.4 g/dL (ref 30.0–36.0)
MCV: 103.7 fL — ABNORMAL HIGH (ref 80.0–100.0)
Platelets: 846 10*3/uL — ABNORMAL HIGH (ref 150–400)
RBC: 3.21 MIL/uL — ABNORMAL LOW (ref 4.22–5.81)
RDW: 13.1 % (ref 11.5–15.5)
WBC: 12.9 10*3/uL — ABNORMAL HIGH (ref 4.0–10.5)
nRBC: 0 % (ref 0.0–0.2)

## 2022-12-27 LAB — URINALYSIS, ROUTINE W REFLEX MICROSCOPIC
Bilirubin Urine: NEGATIVE
Glucose, UA: NEGATIVE mg/dL
Hgb urine dipstick: NEGATIVE
Ketones, ur: NEGATIVE mg/dL
Leukocytes,Ua: NEGATIVE
Nitrite: NEGATIVE
Protein, ur: NEGATIVE mg/dL
Specific Gravity, Urine: 1.014 (ref 1.005–1.030)
pH: 5 (ref 5.0–8.0)

## 2022-12-27 LAB — COMPREHENSIVE METABOLIC PANEL
ALT: 85 U/L — ABNORMAL HIGH (ref 0–44)
AST: 69 U/L — ABNORMAL HIGH (ref 15–41)
Albumin: 2.9 g/dL — ABNORMAL LOW (ref 3.5–5.0)
Alkaline Phosphatase: 82 U/L (ref 38–126)
Anion gap: 17 — ABNORMAL HIGH (ref 5–15)
BUN: 18 mg/dL (ref 8–23)
CO2: 21 mmol/L — ABNORMAL LOW (ref 22–32)
Calcium: 9.3 mg/dL (ref 8.9–10.3)
Chloride: 98 mmol/L (ref 98–111)
Creatinine, Ser: 1.04 mg/dL (ref 0.61–1.24)
GFR, Estimated: >60 mL/min (ref >60)
Glucose, Bld: 133 mg/dL — ABNORMAL HIGH (ref 70–99)
Potassium: 3.9 mmol/L (ref 3.5–5.1)
Sodium: 136 mmol/L (ref 135–145)
Total Bilirubin: 0.6 mg/dL (ref 0.3–1.2)
Total Protein: 7.3 g/dL (ref 6.5–8.1)

## 2022-12-27 LAB — TROPONIN I (HIGH SENSITIVITY): Troponin I (High Sensitivity): 31 ng/L — ABNORMAL HIGH (ref <18)

## 2022-12-27 MED ORDER — IOHEXOL 350 MG/ML SOLN
75.0000 mL | Freq: Once | INTRAVENOUS | Status: AC | PRN
  Administered 2022-12-27: 75 mL via INTRAVENOUS

## 2022-12-27 MED ORDER — MORPHINE SULFATE (PF) 4 MG/ML IV SOLN
4.0000 mg | Freq: Once | INTRAVENOUS | Status: AC
  Administered 2022-12-27: 4 mg via INTRAVENOUS
  Filled 2022-12-27: qty 1

## 2022-12-27 NOTE — ED Notes (Signed)
Patient transported to X-ray 

## 2022-12-27 NOTE — ED Notes (Signed)
Pt returned back to ED from [place]

## 2022-12-27 NOTE — ED Notes (Signed)
ED Provider at bedside. 

## 2022-12-27 NOTE — ED Triage Notes (Signed)
Per [facility] pt coming from home- pt has CABG on 18th of last month and concerned he was sent home too early. Had e-visit with his PCP today and his doctor told him to come get admitted. C/o pain to right wrist and left foot that have gout that he is currently getting treated for. Patient feels like he is slow and cannot get around at home and needs more assistance at home.

## 2022-12-27 NOTE — ED Notes (Signed)
Patient transported to CT 

## 2022-12-27 NOTE — ED Provider Triage Note (Signed)
Emergency Medicine Provider Triage Evaluation Note  Erik [name] , a 77 y.o. male  was evaluated in triage.  Pt complains of unable to perform ADL after CABG x2.5wks ago, left foot pain and right wrist pain  Review of Systems  Positive: Right wrist pain, left foot pain Negative: Generalized weakness, nausea vomiting diarrhea, shortness of breath, chest pain  Physical Exam  BP (!) 132/110 (BP Location: Left Arm)   Pulse 79   Temp 98.5 F (36.9 C)   Resp 16   Ht 5\' 10"  (1.778 m)   Wt 102.1 kg   SpO2 96%   BMI 32.28 kg/m  Gen:   Awake, no distress   Resp:  Normal effort  MSK:   Moves extremities with left sided foot discomfort  Other:  Strong dorsal pedal pulse bilaterally, cap refill <2  Medical Decision Making  Medically screening exam initiated at 6:25 PM.  Appropriate orders placed.  Erik [name] was informed that the remainder of the evaluation will be completed by another provider, this initial triage assessment does not replace that evaluation, and the importance of remaining in the ED until their evaluation is complete.  Patient was seen at primary care today and reported deconditioning since being discharged home from hospital.    Erik Knudsen, PA-C 12/27/22 1828

## 2022-12-27 NOTE — ED Provider Notes (Addendum)
[place] EMERGENCY DEPARTMENT AT [facility] Provider Note   CSN: [id number] Arrival date & time: 12/27/22  1750     History  Chief Complaint  Patient presents with   Wrist Pain   Ankle Pain    Erik [name] is a 77 y.o. male status post CABG 12/11/2022, history of gout, hypertension presented for admission for physical therapy.  Patient sees Dr. Cliffton Asters for cardiothoracic surgery and was told today to come to the ER as he appears to not be doing well after the CABG and the surgeon stated that he would need PT to help his recovery.  Wife is present to assist in history and states that patient has not been doing well since being discharged and believes he was discharged too soon.  Patient at time of discharge according to the wife started having left foot pain and redness and was told that he has a gout exacerbation however states he does not have prior diagnoses of gout.  Patient also notes that his right hand is hurting as well and was also told this is a gout exacerbation.  Patient states that even the blanket will hurt his hand.  Patient is on colchicine and steroids that he began yesterday.  Patient also notes that when he got to the ER today he started having chest pain or shortness of breath.  Unable to say if the chest pain radiates or if it is pleuritic in nature.  Patient does note that his left leg is also tender and wife states that has been slightly swollen when compared to the right.  Home Medications Prior to Admission medications   Medication Sig Start Date End Date Taking? Authorizing Provider  amiodarone (PACERONE) 200 MG tablet Take  200 mg bid for one week;then take 200 mg daily thereafter 12/17/22   Ardelle Balls, PA-C  apixaban (ELIQUIS) 5 MG TABS tablet Take 1 tablet (5 mg total) by mouth 2 (two) times daily. 12/16/22   Ardelle Balls, PA-C  Ascorbic Acid (VITAMIN C) 500 MG CAPS Take 500 mg by mouth every evening. Chewable    [provider]  aspirin EC 81 MG tablet Take 1 tablet (81 mg total) by mouth daily. Swallow whole. 12/16/22   Ardelle Balls, PA-C  atorvastatin (LIPITOR) 40 MG tablet Take 1 tablet (40 mg total) by mouth daily. 12/16/22   Ardelle Balls, PA-C  b complex vitamins capsule Take 1 capsule by mouth daily.    [provider]  Calcium Carb-Cholecalciferol (CALCIUM 600 + D PO) Take 1 tablet by mouth every evening.    [provider]  Cholecalciferol (VITAMIN D3) 5000 units CAPS Take 5,000 Units by mouth every morning.    [provider]  ferrous sulfate 325 (65 FE) MG tablet Take 325 mg by mouth every evening.    [provider]  fexofenadine (ALLEGRA) 180 MG tablet Take 180 mg by mouth daily.    [provider]  folic acid (FOLVITE) 800 MCG tablet Take 800 mcg by mouth daily.    [provider]  furosemide (LASIX) 40 MG tablet Take 1 tablet (40 mg total) by mouth daily. For 10 days then stop 12/23/22   Lovett Sox, MD  Melatonin 10 MG CAPS Take 10 mg by mouth at bedtime.    [provider]  methocarbamol (ROBAXIN) 500 MG tablet Take 500 mg by mouth daily as needed for muscle spasms.    [provider]  metoprolol tartrate (LOPRESSOR) 25  MG tablet Take 1 tablet (25 mg total) by mouth 2 (two) times daily. 12/16/22   Ardelle Balls, PA-C  mirtazapine (REMERON) 30 MG tablet Take 30 mg by mouth at bedtime. 01/27/21   [provider]  Multiple Vitamin (MULITIVITAMIN WITH MINERALS) TABS Take 1 tablet by mouth daily. Men    [provider]  OVER THE COUNTER MEDICATION Take 1 tablet by mouth daily. Unforgettable memory supplement    [provider]  oxyCODONE (OXY IR/ROXICODONE) 5 MG immediate release tablet Take 1 tablet (5 mg total) by mouth every 4 (four) hours as needed for up to 7 days for severe pain. 12/26/22 01/02/23  Leary Roca, PA-C  pantoprazole (PROTONIX) 40 MG tablet Take 1  tablet (40 mg total) by mouth at bedtime. 12/16/22   Ardelle Balls, PA-C  potassium chloride SA (KLOR-CON M) 20 MEQ tablet Take 1 tablet (20 mEq total) by mouth daily. For 10 days then stop 12/23/22   Lovett Sox, MD  predniSONE (DELTASONE) 5 MG tablet Day 1- 5 tablets Day 2- 4 tablets Day3 - 3 tablets Day 4- 2 tablets Day 5- 1 tablet 12/19/22   Stehler, Oren Bracket, PA-C  Selenium 200 MCG CAPS Take 200 mcg by mouth every morning.    [provider]  vitamin B-12 (CYANOCOBALAMIN) 500 MCG tablet Take 500 mcg by mouth daily.    [provider]      Allergies    Patient has no known allergies.    Review of Systems   Review of Systems  Physical Exam Updated Vital Signs BP (!) 132/110 (BP Location: Left Arm)   Pulse 79   Temp 98.5 F (36.9 C)   Resp 16   Ht 5\' 10"  (1.778 m)   Wt 102.1 kg   SpO2 96%   BMI 32.28 kg/m  Physical Exam Vitals reviewed.  Constitutional:      General: He is not in acute distress. HENT:     Head: Normocephalic and atraumatic.  Eyes:     Extraocular Movements: Extraocular movements intact.     Conjunctiva/sclera: Conjunctivae normal.     Pupils: Pupils are equal, round, and reactive to light.  Cardiovascular:     Rate and Rhythm: Normal rate and regular rhythm.     Pulses: Normal pulses.     Heart sounds: Normal heart sounds.     Comments: 2+ bilateral radial/dorsalis pedis pulses with regular rate Pulmonary:     Effort: Pulmonary effort is normal. No respiratory distress.     Breath sounds: Normal breath sounds.  Abdominal:     Palpations: Abdomen is soft.     Tenderness: There is no abdominal tenderness. There is no guarding or rebound.  Musculoskeletal:        General: Normal range of motion.     Cervical back: Normal range of motion and neck supple.     Comments: Tender to palpation on left foot and right hand to light touch No bony abnormalities palpated Does not want to range of these extremities due to  pain Compartment soft Pain not out of proportion Left calf tenderness Unable to appreciate any leg edema  Skin:    General: Skin is warm and dry.     Capillary Refill: Capillary refill takes less than 2 seconds.  Neurological:     General: No focal deficit present.     Mental Status: He is alert and oriented to person, place, and time.     Comments: Sensation intact in  all 4 limbs  Psychiatric:        Mood and Affect: Mood normal.     ED Results / Procedures / Treatments   Labs (all labs ordered are listed, but only abnormal results are displayed) Labs Reviewed  CBC - Abnormal; Notable for the following components:      Result Value   WBC 12.9 (*)    RBC 3.21 (*)    Hemoglobin 10.8 (*)    HCT 33.3 (*)    MCV 103.7 (*)    Platelets 846 (*)    All other components within normal limits  COMPREHENSIVE METABOLIC PANEL - Abnormal; Notable for the following components:   CO2 21 (*)    Glucose, Bld 133 (*)    Albumin 2.9 (*)    AST 69 (*)    ALT 85 (*)    Anion gap 17 (*)    All other components within normal limits  URINALYSIS, ROUTINE W REFLEX MICROSCOPIC    EKG None  Radiology No results found.  Procedures Procedures    Medications Ordered in ED Medications - No data to display  ED Course/ Medical Decision Making/ A&P                                 Medical Decision Making Amount and/or Complexity of Data Reviewed Radiology: ordered.  Risk Prescription drug management.   Lonia Chimera 77 y.o. presented today for chest pain, shortness of breath, failure to thrive post CABG surgery. Working DDx that I considered at this time includes, but not limited to, failure to thrive, ACS, PE, DVT, costochondritis, gout, fracture, cellulitis,.  R/o DDx: Pending  Review of prior external notes: 12/27/2022 office visit  Unique Tests and My Interpretation:  CBC: Mild leukocytosis 12.9 however this appears baseline CMP: Anion gap 17 with mild transaminitis UA:  Unremarkable Troponin: 31 Left foot x-ray: Degenerative changes in the first MTP Right hand x-ray: Arthritis in thumb carpal CTA: pending [id number]  Discussion with Independent Historian:  Wife  Discussion of Management of Tests:  Lightfoot, MD CT surgeon  Risk: pending  Risk Stratification Score: None  Plan: On exam patient was no acute distress and had stable vitals.  On exam patient is extremely tender to both his right hand and left foot with some erythema noted in both without swelling.  Due to patient saying that even the blanket hurts both of these extremities have high suspicion patient is having a gout exacerbation however will obtain x-rays.  I spoke to the patient and wife about how he is already on the right medication for it and will need to give the medication more time.  Patient did have left calf tenderness and wife states it has been swollen over the past few days but is gone down and so ultrasound ordered however we do not have ultrasound here today.  Patient does also note he has chest pain with some shortness of breath that began when he got to the ER and given his recent surgery will get CTA chest to rule out PE.  I spoke to the CT surgeon on-call as he is there CT surgeon and he states that patient needs to be admitted for cardiac rehab and that patient will need to go to the hospitalist as he cannot admit.  Patient is newly on aspirin which would contribute to his mild transaminitis and anion gap however and not concerned about salicylate overdose at  this time as patient has been taking them strictly as they have been prescribed according to him and his wife.  Patient signed out to Barrie Dunker, PA-C.  Please review their note for the continuation of patient's care.  The plan at this point is admit but rule out acute pathology at this time.  Patient will need admission for cardiac rehab as per CT surgery's recommendation.          Final Clinical Impression(s) /  ED Diagnoses Final diagnoses:  None    Rx / DC Orders ED Discharge Orders     None         Remi Deter 12/27/22 2350    Netta Corrigan, PA-C 12/27/22 2354    Pricilla Loveless, MD 02/14/23 1358

## 2022-12-27 NOTE — ED Notes (Signed)
Pt returned from xray

## 2022-12-27 NOTE — Progress Notes (Addendum)
     [address]       [name] [address]             [phone]       Patient: Home Provider: Office Consent for Telemedicine visit obtained.  Today's visit was completed via a real-time telehealth (see specific modality noted below). The patient/authorized person provided oral consent at the time of the visit to engage in a telemedicine encounter with the present provider at [facility]. The patient/authorized person was informed of the potential benefits, limitations, and risks of telemedicine. The patient/authorized person expressed understanding that the laws that protect confidentiality also apply to telemedicine. The patient/authorized person acknowledged understanding that telemedicine does not provide emergency services and that he or she would need to call 911 or proceed to the nearest hospital for help if such a need arose.   Total time spent in the clinical discussion 10 minutes.  Telehealth Modality: Phone visit (audio only)  I had a telephone visit with  Erik [name].  Her husband is s/p CABG.  He remains deconditioned, and is not moving much.  She is very overwhelmed and does not think he will do well.  I advised her to bring him to the ED so that he can readmitted, and receive further physical therapy.  [name]

## 2022-12-28 ENCOUNTER — Other Ambulatory Visit: Payer: Self-pay | Admitting: Interventional Cardiology

## 2022-12-28 ENCOUNTER — Emergency Department (HOSPITAL_COMMUNITY): Payer: Medicare Other

## 2022-12-28 DIAGNOSIS — M7989 Other specified soft tissue disorders: Secondary | ICD-10-CM | POA: Diagnosis not present

## 2022-12-28 DIAGNOSIS — Z9889 Other specified postprocedural states: Secondary | ICD-10-CM | POA: Diagnosis not present

## 2022-12-28 DIAGNOSIS — R0602 Shortness of breath: Secondary | ICD-10-CM | POA: Diagnosis present

## 2022-12-28 DIAGNOSIS — M19031 Primary osteoarthritis, right wrist: Secondary | ICD-10-CM | POA: Diagnosis present

## 2022-12-28 DIAGNOSIS — M109 Gout, unspecified: Secondary | ICD-10-CM | POA: Diagnosis present

## 2022-12-28 DIAGNOSIS — D72829 Elevated white blood cell count, unspecified: Secondary | ICD-10-CM | POA: Diagnosis present

## 2022-12-28 DIAGNOSIS — Z7982 Long term (current) use of aspirin: Secondary | ICD-10-CM | POA: Diagnosis not present

## 2022-12-28 DIAGNOSIS — Z951 Presence of aortocoronary bypass graft: Secondary | ICD-10-CM | POA: Diagnosis not present

## 2022-12-28 DIAGNOSIS — M79672 Pain in left foot: Secondary | ICD-10-CM | POA: Diagnosis present

## 2022-12-28 DIAGNOSIS — Z79899 Other long term (current) drug therapy: Secondary | ICD-10-CM | POA: Diagnosis not present

## 2022-12-28 DIAGNOSIS — R627 Adult failure to thrive: Secondary | ICD-10-CM | POA: Diagnosis present

## 2022-12-28 DIAGNOSIS — R0789 Other chest pain: Secondary | ICD-10-CM | POA: Diagnosis present

## 2022-12-28 DIAGNOSIS — M25531 Pain in right wrist: Secondary | ICD-10-CM | POA: Diagnosis present

## 2022-12-28 DIAGNOSIS — M19072 Primary osteoarthritis, left ankle and foot: Secondary | ICD-10-CM | POA: Diagnosis present

## 2022-12-28 DIAGNOSIS — R262 Difficulty in walking, not elsewhere classified: Secondary | ICD-10-CM | POA: Diagnosis present

## 2022-12-28 DIAGNOSIS — Z7901 Long term (current) use of anticoagulants: Secondary | ICD-10-CM | POA: Diagnosis not present

## 2022-12-28 DIAGNOSIS — I10 Essential (primary) hypertension: Secondary | ICD-10-CM | POA: Diagnosis present

## 2022-12-28 DIAGNOSIS — G8929 Other chronic pain: Secondary | ICD-10-CM | POA: Diagnosis present

## 2022-12-28 LAB — TROPONIN I (HIGH SENSITIVITY): Troponin I (High Sensitivity): 31 ng/L — ABNORMAL HIGH (ref <18)

## 2022-12-28 MED ORDER — METOPROLOL TARTRATE 25 MG PO TABS
25.0000 mg | ORAL_TABLET | Freq: Two times a day (BID) | ORAL | Status: DC
  Administered 2022-12-28 (×3): 25 mg via ORAL
  Filled 2022-12-28 (×3): qty 1

## 2022-12-28 MED ORDER — ADULT MULTIVITAMIN W/MINERALS CH
1.0000 | ORAL_TABLET | Freq: Every day | ORAL | Status: DC
  Administered 2022-12-28: 1 via ORAL
  Filled 2022-12-28: qty 1

## 2022-12-28 MED ORDER — ACETAMINOPHEN 325 MG PO TABS: 650.0000 mg | ORAL_TABLET | ORAL | Status: DC | PRN

## 2022-12-28 MED ORDER — FOLIC ACID 1 MG PO TABS
1.0000 mg | ORAL_TABLET | Freq: Every day | ORAL | Status: DC
  Administered 2022-12-28: 1 mg via ORAL
  Filled 2022-12-28: qty 1

## 2022-12-28 MED ORDER — APIXABAN 5 MG PO TABS
5.0000 mg | ORAL_TABLET | Freq: Two times a day (BID) | ORAL | Status: DC
  Administered 2022-12-28 (×2): 5 mg via ORAL
  Filled 2022-12-28 (×2): qty 1

## 2022-12-28 MED ORDER — POTASSIUM CHLORIDE CRYS ER 20 MEQ PO TBCR
20.0000 meq | EXTENDED_RELEASE_TABLET | Freq: Every day | ORAL | Status: DC
  Administered 2022-12-28: 20 meq via ORAL
  Filled 2022-12-28: qty 1

## 2022-12-28 MED ORDER — CYANOCOBALAMIN 500 MCG PO TABS
500.0000 ug | ORAL_TABLET | Freq: Every day | ORAL | Status: DC
  Administered 2022-12-28: 500 ug via ORAL
  Filled 2022-12-28: qty 1

## 2022-12-28 MED ORDER — MORPHINE SULFATE (PF) 4 MG/ML IV SOLN
4.0000 mg | Freq: Once | INTRAVENOUS | Status: AC
  Administered 2022-12-28: 4 mg via INTRAVENOUS
  Filled 2022-12-28: qty 1

## 2022-12-28 MED ORDER — OXYCODONE HCL 5 MG PO TABS: 5.0000 mg | ORAL_TABLET | ORAL | Status: DC | PRN

## 2022-12-28 MED ORDER — OXYCODONE HCL 5 MG PO TABS
5.0000 mg | ORAL_TABLET | ORAL | Status: DC | PRN
  Administered 2022-12-28 (×5): 5 mg via ORAL
  Filled 2022-12-28 (×5): qty 1

## 2022-12-28 MED ORDER — B COMPLEX-C PO TABS
1.0000 | ORAL_TABLET | Freq: Every day | ORAL | Status: DC
  Administered 2022-12-28: 1 via ORAL
  Filled 2022-12-28: qty 1

## 2022-12-28 MED ORDER — ONDANSETRON HCL 4 MG PO TABS: 4.0000 mg | ORAL_TABLET | Freq: Three times a day (TID) | ORAL | Status: DC | PRN

## 2022-12-28 MED ORDER — MELATONIN 5 MG PO TABS
10.0000 mg | ORAL_TABLET | Freq: Every day | ORAL | Status: DC
  Administered 2022-12-28: 10 mg via ORAL
  Filled 2022-12-28: qty 2

## 2022-12-28 MED ORDER — LORATADINE 10 MG PO TABS
10.0000 mg | ORAL_TABLET | Freq: Every day | ORAL | Status: DC
  Administered 2022-12-28: 10 mg via ORAL
  Filled 2022-12-28: qty 1

## 2022-12-28 MED ORDER — VITAMIN C 500 MG PO CAPS: 500.0000 mg | ORAL_CAPSULE | Freq: Every evening | ORAL | Status: DC

## 2022-12-28 MED ORDER — MIRTAZAPINE 15 MG PO TABS
30.0000 mg | ORAL_TABLET | Freq: Every day | ORAL | Status: DC
  Administered 2022-12-28: 30 mg via ORAL
  Filled 2022-12-28: qty 2

## 2022-12-28 MED ORDER — PANTOPRAZOLE SODIUM 40 MG PO TBEC
40.0000 mg | DELAYED_RELEASE_TABLET | Freq: Every day | ORAL | Status: DC
  Administered 2022-12-28: 40 mg via ORAL
  Filled 2022-12-28: qty 1

## 2022-12-28 MED ORDER — AMIODARONE HCL 200 MG PO TABS
200.0000 mg | ORAL_TABLET | Freq: Every day | ORAL | Status: DC
  Administered 2022-12-28: 200 mg via ORAL
  Filled 2022-12-28: qty 1

## 2022-12-28 MED ORDER — VITAMIN D 25 MCG (1000 UNIT) PO TABS
5000.0000 [IU] | ORAL_TABLET | Freq: Every morning | ORAL | Status: DC
  Administered 2022-12-28: 5000 [IU] via ORAL
  Filled 2022-12-28: qty 5

## 2022-12-28 MED ORDER — FERROUS SULFATE 325 (65 FE) MG PO TABS
325.0000 mg | ORAL_TABLET | Freq: Every evening | ORAL | Status: DC
  Administered 2022-12-28: 325 mg via ORAL
  Filled 2022-12-28: qty 1

## 2022-12-28 MED ORDER — ATORVASTATIN CALCIUM 40 MG PO TABS
40.0000 mg | ORAL_TABLET | Freq: Every day | ORAL | Status: DC
  Administered 2022-12-28: 40 mg via ORAL
  Filled 2022-12-28: qty 1

## 2022-12-28 NOTE — ED Notes (Signed)
Patient stating he does not want to be admitted.   He states "I no longer trust this hospital and my doctor".

## 2022-12-28 NOTE — ED Provider Notes (Signed)
  Physical Exam  BP 130/78   Pulse 74   Temp 98.9 F (37.2 C) (Oral)   Resp 19   Ht 5\' 10"  (1.778 m)   Wt 102.1 kg   SpO2 100%   BMI 32.28 kg/m   Physical Exam  Procedures  Procedures  ED Course / MDM    Medical Decision Making Amount and/or Complexity of Data Reviewed Radiology: ordered.  Risk Prescription drug management. Decision regarding hospitalization.   Patient care assumed from Wynonia Hazard, PA-C at shift handoff.  Please see his note for full details.  Patient is a 77 year old male status post CABG on September 18 of this year with history of gout and hypertension presented at the recommendation of cardiothoracic surgery for admission for cardiac rehab.  Patient also complains of left ankle and right wrist pain and began colchicine yesterday.  Upon arrival at the emergency department patient also endorsed chest pain and shortness of breath.  Left leg is reported to be tender and slightly swollen compared to the right.  At the time of my assumption of care troponins and CT angio PE study were pending.  Troponins were flat, initial and repeat of 31.  CT angio chest PE study shows: 1. No pulmonary embolism.  2. Mild global cardiomegaly. Status post coronary artery bypass  grafting.  3. Trace left pleural effusion.   Patient states he is unable to walk unable to climb stairs.  At this time I believe patient would benefit from admission with likely admission eventually to cardiac rehab.  Will consult hospitalist for admission to the hospital at this time due to patient's general weakness.  DVT study of the left lower extremity has been ordered but is unfortunately unavailable overnight.  Discussed case with Dr.Crosley.  She does not feel the patient would need admission but could likely be a border until social work is able to help place him in rehab.  Patient placed in TOC border status.  TOC consult placed for rehab placement.  Home meds and diet ordered.       Darrick Grinder, PA-C 12/28/22 0320    Sabas Sous, MD 12/28/22 [phone]

## 2022-12-28 NOTE — ED Notes (Signed)
Patient to vascular.

## 2022-12-28 NOTE — ED Provider Notes (Signed)
Emergency Medicine Observation Re-evaluation Note  Erik [name] is a 77 y.o. male, seen on rounds today.  Pt initially presented to the ED for complaints of Wrist Pain and Ankle Pain  The patient initially presented to the emergency department with diffuse weakness and malaise status post CABG on September 18.  Has been seen and worked up in the emergency department with no evidence of acute abnormality which would require inpatient hospitalization.  ER team had attempted to admit the patient for pain control and PT/OT however inpatient hospitalist declined admission.  He was also seen by his CT surgery team who had initially recommended SNF.  After discussion with TOC, family would prefer for a plan for discharge home with home health.  Home health nursing orders were placed and [facility] was contacted for transport home.  I have reviewed the labs performed to date as well as medications administered while in observation.  Recent changes in the last 24 hours include evaluated by PT, recommended inpatient SNF.  Per TOC, family and patient not comfortable with SNF plan, would prefer home health PT.  Plan  Current plan is for DC home with home health PT.  I discussed the care of the patient with Dr. Cliffton Asters, CT surgery on-call.  Given the patient's severe deconditioning and postoperative pain, the patient will be admitted to his service.    Ernie Avena, MD 12/28/22 [phone]

## 2022-12-28 NOTE — [place] FL2 (Signed)
[place] MEDICAID FL2 LEVEL OF CARE FORM     IDENTIFICATION  Patient Name: Erik [name] Birthdate: Dec 17, 1945 Sex: male Admission Date (Current Location): 12/27/2022  [facility] and [place] Number:  Producer, television/film/video and Address:  [facility] [place], [address], [place], [place] [address]      Provider Number: [id number]  Attending Physician Name and Address:  System, Provider Not In  Relative Name and Phone Number:  Blanton Kardell    Current Level of Care: Other (Comment) (ED) Recommended Level of Care: Skilled Nursing Facility Prior Approval Number:    Date Approved/Denied:   PASRR Number: [id number]  Discharge Plan: SNF    Current Diagnoses: Patient Active Problem List   Diagnosis Date Noted   S/P CABG (coronary artery bypass graft) 12/11/2022   Coronary artery disease 12/11/2022   Acute respiratory failure (HCC) 12/11/2022   Arthritis 07/26/2016   BMI 39.0-39.9,adult 07/26/2016   Dysphagia 07/26/2016   Elevated C-reactive protein 07/26/2016   Family history of prostate cancer 07/26/2016   Gingivitis 07/26/2016   Gout 07/26/2016   Hypertension 07/26/2016   Hypogonadism in male 07/26/2016   Infectious colitis, enteritis and gastroenteritis 07/26/2016   Leukocytosis 07/26/2016   Moderate single current episode of major depressive disorder (HCC) 07/26/2016   Positive ANA (antinuclear antibody) 07/26/2016   Prediabetes 07/26/2016   Coronary artery disease of native artery of native heart with stable angina pectoris (HCC) 08/14/2015   Hyperlipidemia 07/19/2015   Angina pectoris (HCC)    Chest pain 05/05/2014   Esophageal reflux 12/15/2013   Essential hypertension, benign 10/03/2013   Obesity, unspecified 10/03/2013   Exertional angina (HCC) 10/03/2013   Tinea corporis    Obesity    ROSACEA 09/22/2009   SHOULDER PAIN 09/22/2009   HYPERTROPHY PROSTATE W/O UR OBST & OTH LUTS 07/05/2009   INSOMNIA-SLEEP DISORDER-UNSPEC 07/05/2009     Orientation RESPIRATION BLADDER Height & Weight     Self, Time, Situation, Place  Normal Continent Weight: 102.1 kg Height:  5\' 10"  (177.8 cm)  BEHAVIORAL SYMPTOMS/MOOD NEUROLOGICAL BOWEL NUTRITION STATUS      Continent Diet (See discharge orders)  AMBULATORY STATUS COMMUNICATION OF NEEDS Skin   Extensive Assist Verbally Surgical wounds (midsternal)                       Personal Care Assistance Level of Assistance  Bathing, Feeding, Dressing Bathing Assistance: Maximum assistance Feeding assistance: Limited assistance Dressing Assistance: Maximum assistance     Functional Limitations Info             SPECIAL CARE FACTORS FREQUENCY  OT (By licensed OT), PT (By licensed PT)     PT Frequency: 5 x a week OT Frequency: 5 x a week            Contractures Contractures Info: Not present    Additional Factors Info  Code Status Code Status Info: Full Code             Current Medications (12/28/2022):  This is the current hospital active medication list Current Facility-Administered Medications  Medication Dose Route Frequency Provider Last Rate Last Admin   acetaminophen (TYLENOL) tablet 650 mg  650 mg Oral Q4H PRN Barrie Dunker B, PA-C       amiodarone (PACERONE) tablet 200 mg  200 mg Oral Daily Barrie Dunker B, PA-C   200 mg at 12/28/22 0912   apixaban (ELIQUIS) tablet 5 mg  5 mg Oral BID Darrick Grinder, PA-C  5 mg at 12/28/22 0912   atorvastatin (LIPITOR) tablet 40 mg  40 mg Oral Daily Darrick Grinder, PA-C   40 mg at 12/28/22 9604   B-complex with vitamin C tablet 1 tablet  1 tablet Oral Daily Darrick Grinder, PA-C   1 tablet at 12/28/22 5409   cholecalciferol (VITAMIN D3) 25 MCG (1000 UNIT) tablet 5,000 Units  5,000 Units Oral q morning Darrick Grinder, PA-C   5,000 Units at 12/28/22 8119   cyanocobalamin (VITAMIN B12) tablet 500 mcg  500 mcg Oral Daily Darrick Grinder, PA-C   500 mcg at 12/28/22 1478   ferrous sulfate tablet 325 mg  325  mg Oral QPM Barrie Dunker B, PA-C       folic acid (FOLVITE) tablet 1 mg  1 mg Oral Daily Barrie Dunker B, PA-C   1 mg at 12/28/22 0915   loratadine (CLARITIN) tablet 10 mg  10 mg Oral Daily Darrick Grinder, PA-C   10 mg at 12/28/22 0913   melatonin tablet 10 mg  10 mg Oral QHS Barrie Dunker B, PA-C       metoprolol tartrate (LOPRESSOR) tablet 25 mg  25 mg Oral BID Darrick Grinder, PA-C   25 mg at 12/28/22 0913   mirtazapine (REMERON) tablet 30 mg  30 mg Oral QHS Barrie Dunker B, PA-C       multivitamin with minerals tablet 1 tablet  1 tablet Oral Daily Darrick Grinder, PA-C   1 tablet at 12/28/22 0916   ondansetron (ZOFRAN) tablet 4 mg  4 mg Oral Q8H PRN Barrie Dunker B, PA-C       oxyCODONE (Oxy IR/ROXICODONE) immediate release tablet 5 mg  5 mg Oral Q4H PRN Barrie Dunker B, PA-C   5 mg at 12/28/22 1219   pantoprazole (PROTONIX) EC tablet 40 mg  40 mg Oral QHS McCauley, Larry B, PA-C       potassium chloride SA (KLOR-CON M) CR tablet 20 mEq  20 mEq Oral Daily Darrick Grinder, PA-C   20 mEq at 12/28/22 2956   Current Outpatient Medications  Medication Sig Dispense Refill   amiodarone (PACERONE) 200 MG tablet Take  200 mg bid for one week;then take 200 mg daily thereafter 60 tablet 1   apixaban (ELIQUIS) 5 MG TABS tablet Take 1 tablet (5 mg total) by mouth 2 (two) times daily. 60 tablet 0   Ascorbic Acid (VITAMIN C) 500 MG CAPS Take 500 mg by mouth every evening. Chewable     aspirin EC 81 MG tablet Take 1 tablet (81 mg total) by mouth daily. Swallow whole.     atorvastatin (LIPITOR) 40 MG tablet Take 1 tablet (40 mg total) by mouth daily. 30 tablet 1   b complex vitamins capsule Take 1 capsule by mouth daily.     Calcium Carb-Cholecalciferol (CALCIUM 600 + D PO) Take 1 tablet by mouth every evening.     Cholecalciferol (VITAMIN D3) 5000 units CAPS Take 5,000 Units by mouth every morning.     colchicine 0.6 MG tablet Take 0.6 mg by mouth 2 (two) times daily as needed (gout).      ferrous sulfate 325 (65 FE) MG tablet Take 325 mg by mouth every evening.     fexofenadine (ALLEGRA) 180 MG tablet Take 180 mg by mouth daily.     folic acid (FOLVITE) 800 MCG tablet Take 800 mcg by mouth daily.     furosemide (LASIX) 40 MG tablet Take 1 tablet (  40 mg total) by mouth daily. For 10 days then stop 10 tablet 0   Melatonin 10 MG CAPS Take 10 mg by mouth at bedtime.     methocarbamol (ROBAXIN) 500 MG tablet Take 500 mg by mouth daily as needed for muscle spasms.     metoprolol tartrate (LOPRESSOR) 25 MG tablet Take 1 tablet (25 mg total) by mouth 2 (two) times daily. 60 tablet 1   mirtazapine (REMERON) 30 MG tablet Take 30 mg by mouth at bedtime.     Multiple Vitamin (MULITIVITAMIN WITH MINERALS) TABS Take 1 tablet by mouth daily. Men     OVER THE COUNTER MEDICATION Take 1 tablet by mouth daily. Unforgettable memory supplement     oxyCODONE (OXY IR/ROXICODONE) 5 MG immediate release tablet Take 1 tablet (5 mg total) by mouth every 4 (four) hours as needed for up to 7 days for severe pain. 30 tablet 0   pantoprazole (PROTONIX) 40 MG tablet Take 1 tablet (40 mg total) by mouth at bedtime.     potassium chloride SA (KLOR-CON M) 20 MEQ tablet Take 1 tablet (20 mEq total) by mouth daily. For 10 days then stop 10 tablet 0   predniSONE (DELTASONE) 5 MG tablet Day 1- 5 tablets Day 2- 4 tablets Day3 - 3 tablets Day 4- 2 tablets Day 5- 1 tablet 15 tablet 0   Selenium 200 MCG CAPS Take 200 mcg by mouth every morning.     vitamin B-12 (CYANOCOBALAMIN) 500 MCG tablet Take 500 mcg by mouth daily.       Discharge Medications: Please see discharge summary for a list of discharge medications.  Relevant Imaging Results:  Relevant Lab Results:   Additional Information SSN [id number]  Lockie Pares, RN

## 2022-12-28 NOTE — ED Notes (Signed)
Pt refused to use a bedside commode.  Pt was able to stand and go to the bathroom (after being wheeled on a wheelchair).  NT advised that she had been helping him use a urinal as he stated he was not able to use the urinal on his own.  Paramedic told NT that if the Pt was able to stand and use the toilet, that she did not need to hold his penis while he urinated.  Charge nurse was made aware of the situation.

## 2022-12-28 NOTE — ED Notes (Signed)
PT wife called upset, that pt has not been transported home. I explained to pt wife that [facility] [place] and they have a list and order to determine which pts is picked up first. Also explained to her [facility]  was called at 6:25 PM. And the times now is 7:28 PM [facility] is backed up so there will be a delay.

## 2022-12-28 NOTE — Care Management (Addendum)
Transition of Care [facility]) - Emergency Department Mini Assessment   Patient Details  Name: Erik [name] MRN: [id number] Date of Birth: [birth year]  Transition of Care [facility]) CM/SW Contact:    Lockie Pares, RN Phone Number: 12/28/2022, 1:50 PM   Clinical Narrative:  Patient had CABG 2 weeks ago was walking 400 feet prior to discharge, presents with inability to walk, with wrist pain foot pain. PT assessment done and revealed SNF recommendation. Patients wife stated she cannot take care of patient at home. Faxed out for SNF acceptances. Called Mrs. Mackowiak. She is very upset, she feels he never should have been discharged. She was directed to bring him to the ED by his physician . She says his nurses  made  fun of him for "peeing in the bed" they did not check him  on the day of discharge  and he could not walk when he got home.  He had to stay in the wheelchair for 6 hours to wait in the ED and did not get his normal medicine. She cannot leave the house until this afternoon due to a sick pet. He has arthritis in his back, She states he is not going to rehab, not until his pain is under control, He does not want to go to rehab, She said the pt person told her he cannot rehab like this.  She wants him admitted until his pain is under control then he can come home with home health. His home health company is [employer].He has 17 steps to his room at home. There is no where on the first floor to put a bed, only a half bath.   She states she will be calling her lawyer.  She states she is at her wits end. She has already talked to the patient experience associate.  Discussed with team ( nursing and ED MD) on securechat. Paged TCTS PA as well for discussion and plan.  1550 attempted to reach Mr Spagnoli He is alert and oriented and verbalizing well.  He states he  does not want to go to rehab. His main problem is his back pain that is chronic.  [facility] home [facility]. Patient states he sometimes  gets confused with pain medication and he requests that I talk to his wife about the plan. He states that "this mess" was created because  people were asking him questions and he doesn't even remember.  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 Messaged nursing about him wanting to get up in a wheelchair.  1600 Called and spoke to Ms Halpin. She wants the patient back home via [facility]. She is not going to be able to get him up the steps again. Full HH is ordered and accepted from [place]. Messaged Nursing about calling [facility]. Did tell Mrs Susman that there is sometimes a wait, but we will do our best to get him home as quick as possible.  1730 called Ms Harr, to update her. She is expressing anger and untrust about the physician and hospital in general  She demanded to speak to Administration and does not wan him moved until she does. Called [facility] Verlon Au. Asked for the patient to not be moved until [facility] has spoken to wife. 1750 Spoke to [employer]. She spoke with wife and she and the patient want to go home and not be admitted with full home health services with [facility]. Messaged with team regarding this.  ED Mini Assessment:  Patient Contact and Communications        ,                 Admission diagnosis:  2 weeks post op Patient Active Problem List   Diagnosis Date Noted   S/P CABG (coronary artery bypass graft) 12/11/2022   Coronary artery disease 12/11/2022   Acute respiratory failure (HCC) 12/11/2022   Arthritis 07/26/2016   BMI 39.0-39.9,adult 07/26/2016   Dysphagia 07/26/2016   Elevated C-reactive protein 07/26/2016   Family history of prostate cancer 07/26/2016   Gingivitis 07/26/2016   Gout 07/26/2016   Hypertension 07/26/2016   Hypogonadism in male 07/26/2016   Infectious colitis, enteritis and gastroenteritis 07/26/2016   Leukocytosis 07/26/2016   Moderate single current episode of major depressive disorder (HCC) 07/26/2016   Positive ANA (antinuclear  antibody) 07/26/2016   Prediabetes 07/26/2016   Coronary artery disease of native artery of native heart with stable angina pectoris (HCC) 08/14/2015   Hyperlipidemia 07/19/2015   Angina pectoris (HCC)    Chest pain 05/05/2014   Esophageal reflux 12/15/2013   Essential hypertension, benign 10/03/2013   Obesity, unspecified 10/03/2013   Exertional angina (HCC) 10/03/2013   Tinea corporis    Obesity    ROSACEA 09/22/2009   SHOULDER PAIN 09/22/2009   HYPERTROPHY PROSTATE W/O UR OBST & OTH LUTS 07/05/2009   INSOMNIA-SLEEP DISORDER-UNSPEC 07/05/2009   PCP:  Mila Palmer, MD Pharmacy:   [id number] - [place], [place] - [address] [place] [address] Phone: [phone] Fax: [phone]  [id number] - [place], [place] - [address] AT [address] OF [facility] [address] [place] [address] Phone: [phone] Fax: [phone]

## 2022-12-28 NOTE — ED Notes (Signed)
PT at bedside.

## 2022-12-28 NOTE — Evaluation (Signed)
Physical Therapy Evaluation Patient Details Name: Erik [name] MRN: [id number] DOB: [birth year] Today's Date: 12/28/2022  History of Present Illness  Pt is a 77 yo male was was sent to ED by Dr. Cliffton Asters due to L ankle and R wrist pain and inability to mobilize. PMH/PSH: CABGx4 on 9/18, arthritis, CAD, GERD, HTN, obesity, tinea dcorporis, kidney stone.   Clinical Impression  Pt recently d/c'd on 9/25 s/p CABGx4. Pt was ambulating >470' with RW and supervision, however per patient and spouse the morning of d/c he had L foot pain and R wrist pain and he couldn't walk and "no one walked him on the day he was discharged." Pt has been unable to mobilize since being discharged home due to 10/10 L foot and R wrist pain. Pt has a flight of stairs to access bed and bath and was unable to get up them and EMS had to get him down. Per chart, MD referred pt to ED for therapy. Pt will not be able to actively participate in therapy program until pain is tolerable and he can WB to permit ambulation. I suspect once pain is under control pt would be able to ambulate with rolling walker with minimal difficulty. At this time recommending inpatient rehab program < 3 hrs a day to allow for pain management to then permit active participation in therapy to transition back to indep. Pending length of stay if patients pain becomes control while in hospital there is a chance pt could d/c home with home health services pending his ability to navigate a flight of stairs. Acute PT to cont to follow.       If plan is discharge home, recommend the following: A lot of help with walking and/or transfers;A little help with bathing/dressing/bathroom;Assist for transportation;Help with stairs or ramp for entrance   Can travel by private vehicle   No    Equipment Recommendations  (I believe pt may have just received a rollator)  Recommendations for Other Services       Functional Status Assessment Patient has had a recent decline  in their functional status and demonstrates the ability to make significant improvements in function in a reasonable and predictable amount of time.     Precautions / Restrictions Precautions Precautions: Fall;Sternal Precaution Comments: pt and spouse reports adhering to "move in the tube" Restrictions Weight Bearing Restrictions: Yes Other Position/Activity Restrictions: limited WBing through UEs due to sternal precautions, and inability to tolerate WBing through R wrist/hand      Mobility  Bed Mobility Overal bed mobility: Needs Assistance Bed Mobility: Supine to Sit, Sit to Sidelying     Supine to sit: HOB elevated, Supervision   Sit to sidelying: Contact guard assist (HOB flat) General bed mobility comments: HOB elevated however pt able to bring self to edge without assist, minA for LE management back into gurny, good return demo of log rolling    Transfers Overall transfer level: Needs assistance Equipment used: Rolling walker (2 wheels) Transfers: Sit to/from Stand Sit to Stand: Contact guard assist, From elevated surface           General transfer comment: pt pushed up with L UE only with increased R LE WBing. contact guard to stedy    Ambulation/Gait               General Gait Details: limited to 3 side steps to head of gurney. pt unable to tolerate L LE WBing requiring increased bilat UE WBing to off weight L LE however unable  to use R UE due to pain when WBing as well. pivoted on ball of R foot to scoot up towards head of gurney  Stairs            Wheelchair Mobility     Tilt Bed    Modified Rankin (Stroke Patients Only)       Balance Overall balance assessment: Needs assistance Sitting-balance support: No upper extremity supported, Feet supported Sitting balance-Leahy Scale: Fair     Standing balance support: Bilateral upper extremity supported, During functional activity, Reliant on assistive device for balance Standing balance-Leahy  Scale: Poor Standing balance comment: requires RW at this time                             Pertinent Vitals/Pain Pain Assessment Pain Assessment: Faces Faces Pain Scale: Hurts little more Pain Location: minimal pain at L ankle and R wrist at rest however significantly increased when touch or with weightbearing    Home Living Family/patient expects to be discharged to:: Private residence Living Arrangements: Spouse/significant other Available Help at Discharge: Family Type of Home: House Home Access: Stairs to enter Entrance Stairs-Rails: Doctor, general practice of Steps: 3 Alternate Level Stairs-Number of Steps: 14 Home Layout: Two level;Bed/bath upstairs Home Equipment: Shower seat      Prior Function Prior Level of Function : Needs assist             Mobility Comments: pt was ambulating >400' with supervision on 9/25 however since d/c has not been able to WB on L LE therefore hasn't been able to ambulate since d/c ADLs Comments: due to pain in L foot/ankle and R wrist pt requiring maxA for ADLs due to inability to use R hand     Extremity/Trunk Assessment   Upper Extremity Assessment Upper Extremity Assessment: RUE deficits/detail RUE Deficits / Details: pt with pain in wrist, noted edema, minimal active movement at wrist and fingers due to pain, unable to use functionally or tolerate WBing    Lower Extremity Assessment Lower Extremity Assessment: LLE deficits/detail RLE Deficits / Details: L ankle and foot swelling, able to actively move it gently in small range, limited by pain    Cervical / Trunk Assessment Cervical / Trunk Assessment: Kyphotic  Communication   Communication Communication: Hearing impairment (soft spoken, increased response time)  Cognition Arousal: Alert Behavior During Therapy: Flat affect Overall Cognitive Status: Impaired/Different from baseline Area of Impairment: Problem solving, Memory                      Memory: Decreased short-term memory       Problem Solving: Slow processing, Decreased initiation, Difficulty sequencing, Requires verbal cues, Requires tactile cues General Comments: pt admits to being "no with it" and forgetting things due to pain medicine        General Comments General comments (skin integrity, edema, etc.): L ankle/foot/lower leg swelling, R wrist swelling    Exercises     Assessment/Plan    PT Assessment Patient needs continued PT services  PT Problem List Decreased strength;Decreased range of motion;Decreased activity tolerance;Decreased balance;Decreased mobility;Decreased coordination;Decreased cognition;Decreased knowledge of use of DME;Decreased safety awareness;Decreased knowledge of precautions       PT Treatment Interventions DME instruction;Gait training;Stair training;Functional mobility training;Therapeutic activities;Therapeutic exercise;Balance training    PT Goals (Current goals can be found in the Care Plan section)  Acute Rehab PT Goals Patient Stated Goal: stop the pain PT Goal Formulation:  With patient/family Time For Goal Achievement: 01/11/23 Potential to Achieve Goals: Good    Frequency Min 1X/week     Co-evaluation               AM-PAC PT "6 Clicks" Mobility  Outcome Measure Help needed turning from your back to your side while in a flat bed without using bedrails?: A Little Help needed moving from lying on your back to sitting on the side of a flat bed without using bedrails?: A Little Help needed moving to and from a bed to a chair (including a wheelchair)?: A Little Help needed standing up from a chair using your arms (e.g., wheelchair or bedside chair)?: A Little Help needed to walk in hospital room?: A Lot Help needed climbing 3-5 steps with a railing? : A Lot 6 Click Score: 16    End of Session Equipment Utilized During Treatment: Gait belt Activity Tolerance: Patient limited by pain Patient left: in  bed;with call bell/phone within reach;with family/visitor present Nurse Communication: Mobility status PT Visit Diagnosis: Unsteadiness on feet (R26.81);Pain;Difficulty in walking, not elsewhere classified (R26.2) Pain - Right/Left: Right Pain - part of body:  (wrist, L ankle/foot)    Time: 2951-8841 PT Time Calculation (min) (ACUTE ONLY): 32 min   Charges:   PT Evaluation $PT Eval Moderate Complexity: 1 Mod PT Treatments $Therapeutic Activity: 8-22 mins PT General Charges $$ ACUTE PT VISIT: 1 Visit         Lewis Shock, PT, DPT Acute Rehabilitation Services Secure chat preferred Office #: [phone]   Iona Hansen 12/28/2022, 12:54 PM

## 2022-12-28 NOTE — Progress Notes (Signed)
[address]       [place] [address]             [id number]   Subjective:  Patient is accompanied wife who is extremely angry and upset.  She feels the patient should never have been discharged.  She is also upset that he sat in a wheelchair for 6 hours in the ED.  He presents to the ED with complaints of failure to thrive, pain in his right hand, and pain in his left leg.  The right hand is painful and he is unable to spread his fingers and he states that he has shooting pain if he lays it down, basically it is not functional.  His left leg he states he has pain along the underside of his left to underfoot.  He is unable to bear weight.  He is able to hoble with his walker, but he is not able to ambulate on his own.  He does have some shortness of breath with ambulation and he notices when he is laying down it will become irregular.  The wife also states that he was short of breath in the ED yesterday.  Occasional chest discomfort that he thinks might be related to his breathing.  He is taking pain medication at home 5 mg of oxycodone every 4 hours prn.  He has a pain regimen from his chronic pain doctor which was 10/325 Hydrocodone/Tylenol however these are on hold as he was instructed to follow his surgeons instructions.  The patient states was prescribed steroids from our office for gouty arthritis.  However, he did not notice any relief from this. She states that she was instructed to contact his primary physician office for colchcine.  He is tolerating a diet and his bowel and bladder habits are normal.  Objective: Vital signs in last 24 hours: Temp:  [97.7 F (36.5 C)-98.9 F (37.2 C)] 97.7 F (36.5 C) (10/05 0747) Pulse Rate:  [72-88] 72 (10/05 0910) Resp:  [14-19] 16 (10/05 0910) BP: (116-140)/(71-118) 116/71 (10/05 0910) SpO2:  [93 %-100 %] 97 % (10/05 0910) Weight:  [102.1 kg] 102.1 kg (10/04 1810)  Intake/Output from previous day: 10/04 0701 - 10/05  0700 In: -  Out: 300 [Urine:300]  General appearance: alert, cooperative, and no distress Heart: regular rate and rhythm Lungs: clear to auscultation bilaterally Abdomen: soft, non-tender; bowel sounds normal; no masses,  no organomegaly Extremities: RLE ecchymosis, EVH sites are healing without issues.Marland Kitchen LLE mildly edematous, no erythema. Painful to touch.. RUE normal, no erythema Wound: clean and dry sternotomy site  Lab Results: Recent Labs    12/27/22 1852  WBC 12.9*  HGB 10.8*  HCT 33.3*  PLT 846*   BMET:  Recent Labs    12/27/22 1852  NA 136  K 3.9  CL 98  CO2 21*  GLUCOSE 133*  BUN 18  CREATININE 1.04  CALCIUM 9.3    PT/INR: No results for input(s): "LABPROT", "INR" in the last 72 hours. ABG    Component Value Date/Time   PHART 7.386 12/11/2022 2246   HCO3 20.4 12/11/2022 2246   TCO2 21 (L) 12/11/2022 2246   ACIDBASEDEF 4.0 (H) 12/11/2022 2246   O2SAT 98 12/11/2022 2246   CBG (last 3)  No results for input(s): "GLUCAP" in the last 72 hours.  Assessment/Plan:  S/P CABG x 4 on 12/11/2022, discharged home 12/18/2022.. he has not made progress.  He is unable to get up and move around  due to pain in his left leg and right hand Right Hand pain, immobility- no evidence of acute process, injury.. +osteoarthrisis LLE pain- mild edema, foot xray again with osteoarthritis.Marland Kitchen duplex completed earlier.. however patient is already on Eliquis prior to admission Pulm- complains of shortness of breath, CT scan unremarkable for PE, no significant pleural effusions  Deconditioning, chronic pain seems to be biggest source of failure to thrive.  He does have a mild leukocytosis, however no acute evidence of infection from surgical sites, urinalysis also negative.  TOC consult has been consulted for possible rehab placement.  I suspect he would qualify for SNF placement.   LOS: 0 days    Lowella Dandy, PA-C 12/28/2022

## 2022-12-28 NOTE — Progress Notes (Signed)
VASCULAR LAB    Left lower extremity venous duplex has been performed.  See CV proc for preliminary results.   Neita Landrigan, RVT 12/28/2022, 8:49 AM

## 2022-12-29 ENCOUNTER — Telehealth: Payer: Self-pay

## 2022-12-29 NOTE — Telephone Encounter (Signed)
Patient discharged from ED last night with [facility]. Follow up phone call to wife Mrs. Culpepper to ensure  that the plan in place was set and they both were doing alright. He has full home health services through [facility]. Mrs Mizuno stated that they are doing well, and the patient did not arrive home until 0300. Spoke about communication and barriers during this prolonged ED visit. The patient was able to climb stairs last evening and get to the bathroom.

## 2023-01-05 IMAGING — MR MR LUMBAR SPINE W/O CM
4 of 5 series · 27 of 48 positions shown · non-contrast
Comparison: None.

CLINICAL DATA: Low back pain.  Hip pain.

EXAM:
MRI LUMBAR SPINE WITHOUT CONTRAST
TECHNIQUE: Multiplanar, multisequence MR imaging of the lumbar spine was
performed. No intravenous contrast was administered.

[Series 3: T2 · sagittal · 4.0mm · 1.09mm/px · 7 of 19 slices shown (1 of 2)]
[im 1/19]
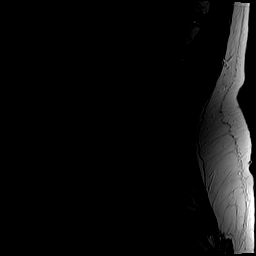
[im 4/19]
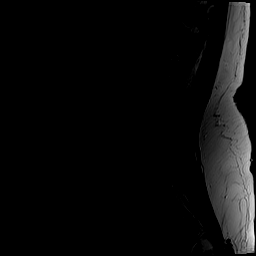
[im 7/19]
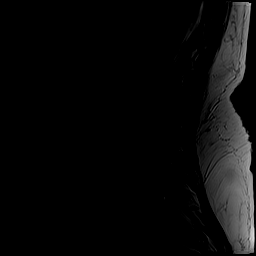
[im 10/19]
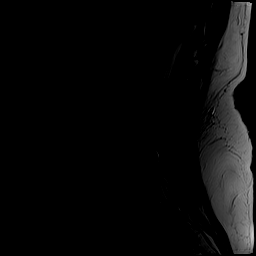
[im 13/19]
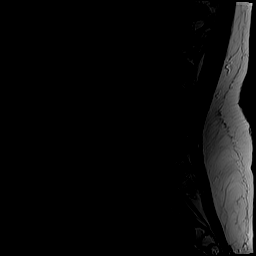
[im 16/19]
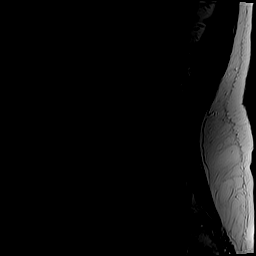
[im 19/19]
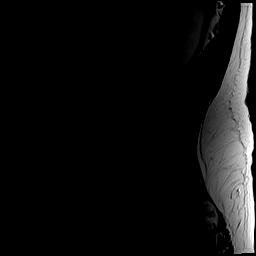

[Series 5: T1 · sagittal · 4.0mm · 1.09mm/px · 6 of 19 slices shown (1 of 2)]
[im 1/19]
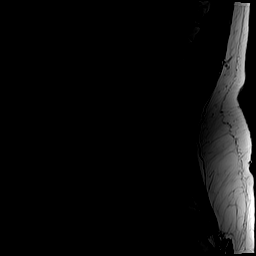
[im 4/19]
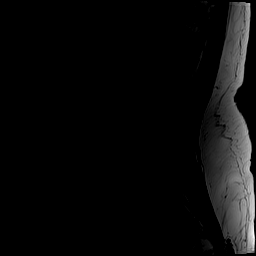
[im 8/19]
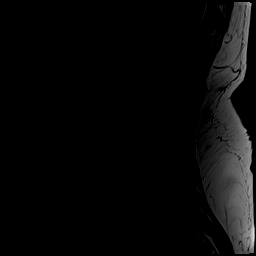
[im 11/19]
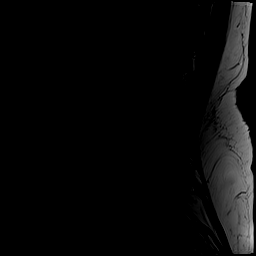
[im 15/19]
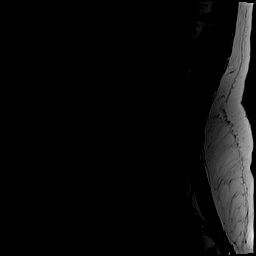
[im 19/19]
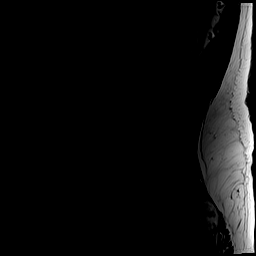

[Series 6: T2 · axial · 4.0mm · 0.39mm/px · z∈[+10,+218]mm · 8 of 42 slices shown (2 of 2)]
[im 1/42]
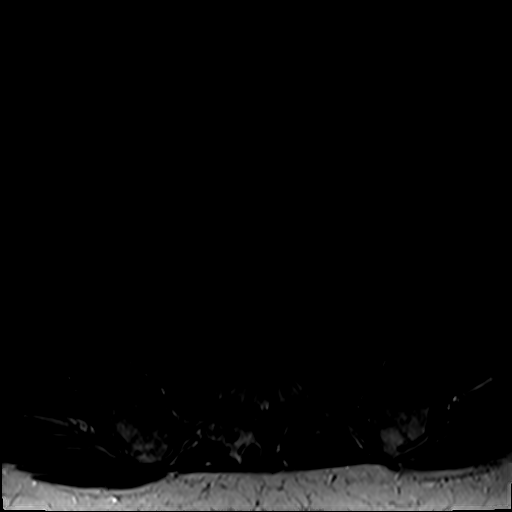
[im 7/42]
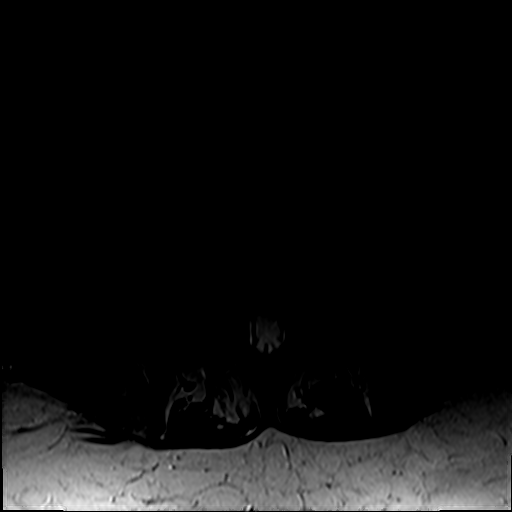
[im 13/42]
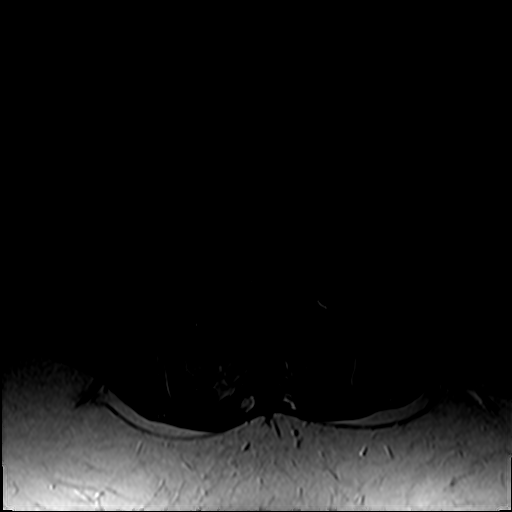
[im 19/42]
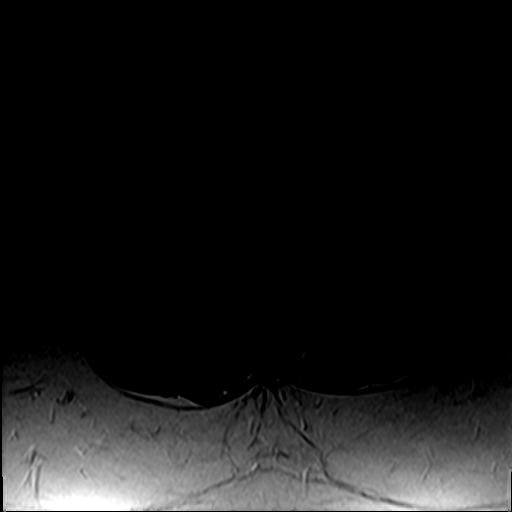
[im 23/42]
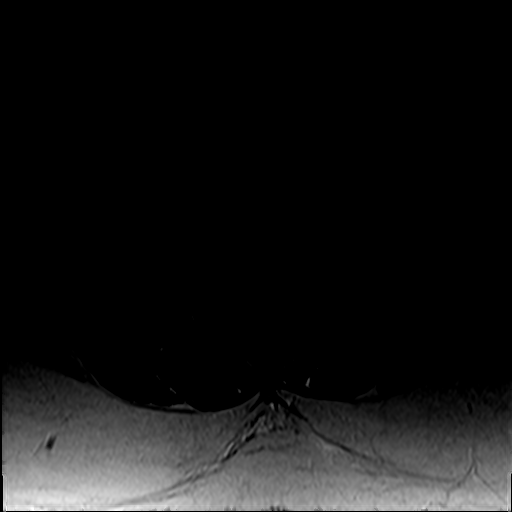
[im 29/42]
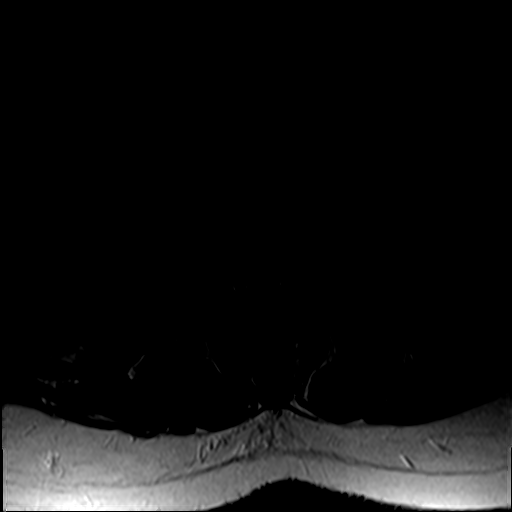
[im 35/42]
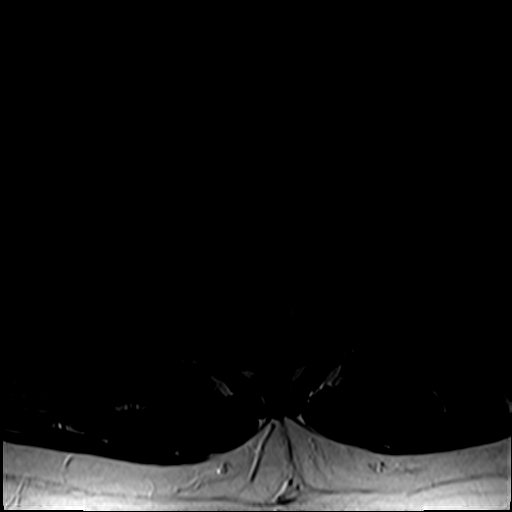
[im 42/42]
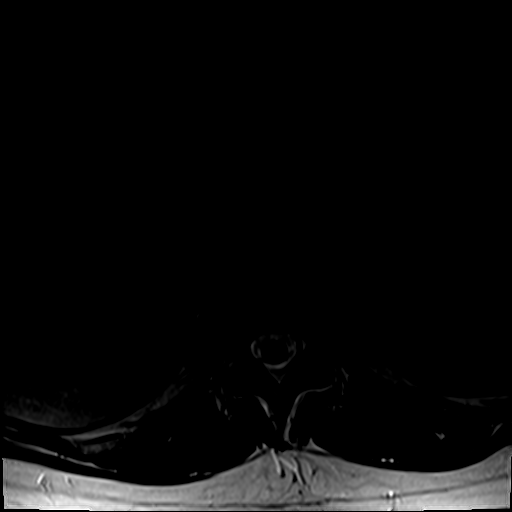

[Series 7: T1 · axial · 4.0mm · 0.39mm/px · z∈[+10,+185]mm · 6 of 42 slices shown (2 of 2)]
[im 1/42]
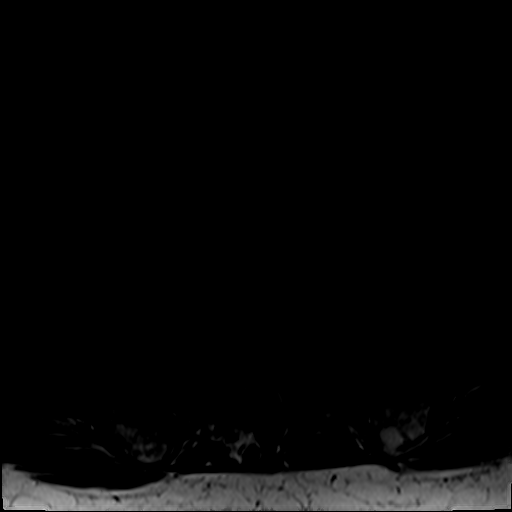
[im 7/42]
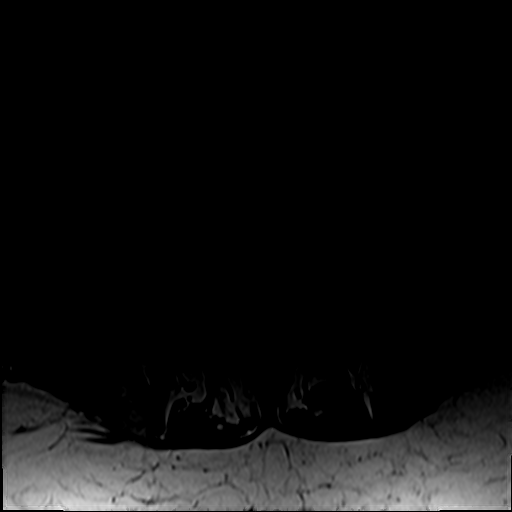
[im 13/42]
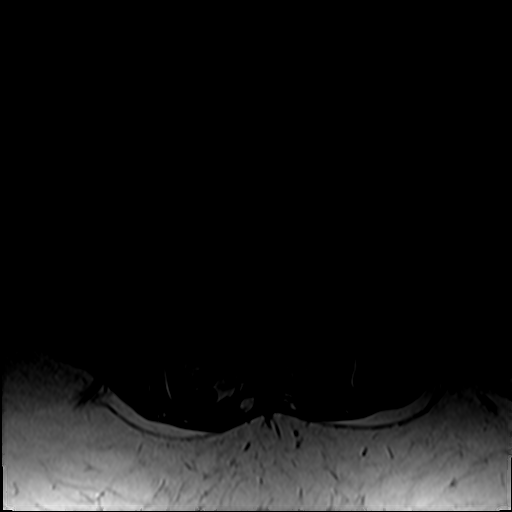
[im 19/42]
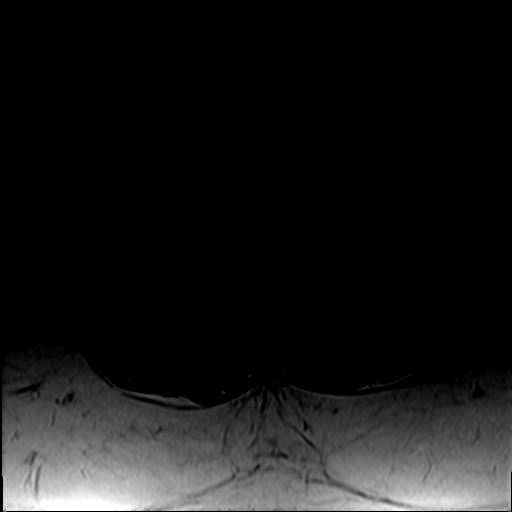
[im 23/42]
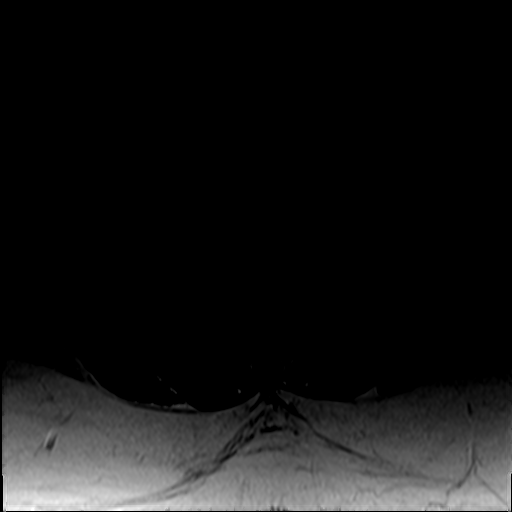
[im 35/42]
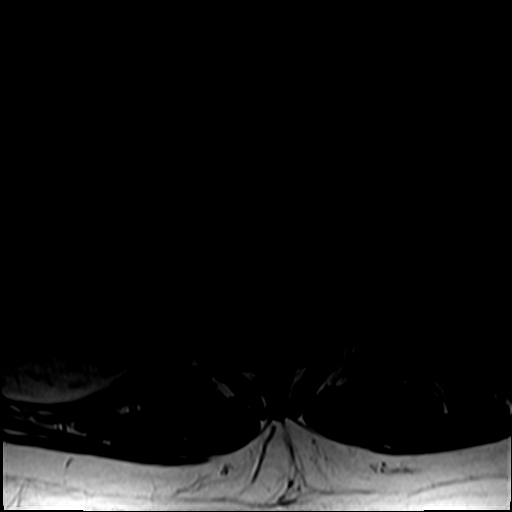

[27 of 48 positions shown; findings below may reference images not displayed]

FINDINGS: Segmentation:  Standard.

Alignment:  Small anterolisthesis of L3 over L4 and L4 over L5.

Vertebrae:  No fracture, evidence of discitis, or bone lesion.

Conus medullaris and cauda equina: Conus extends to the L1 level.
Conus and cauda equina appear normal.

Paraspinal and other soft tissues: Left renal cyst

Disc levels:

T12-L1: No spinal canal or neural foraminal stenosis.

L1-2: Shallow disc bulge and mild facet degenerative changes
resulting in mild bilateral neural foraminal narrowing. No spinal
canal stenosis.

L2-3: Shallow disc bulge and mild facet degenerative changes
resulting in mild right and moderate left neural foraminal
narrowing. No significant spinal canal stenosis.

L3-4: Disc bulge, moderate facet degenerative changes and ligamentum
flavum redundancy resulting in mild spinal canal stenosis, mild
right and moderate to severe left neural foraminal narrowing.

L4-5: Disc bulge, advanced hypertrophic facet degenerative changes
and ligamentum flavum redundancy resulting in mild right and
mild-to-moderate left neural foraminal narrowing. No significant
spinal canal stenosis.

L5-S1: Loss of disc height, right asymmetric disc bulge, advanced
right and moderate left facet degenerative changes. Findings result
in narrowing of the right subarticular zone and mild-to-moderate
right neural foraminal narrowing.
IMPRESSION: 1. Multilevel degenerative changes of the lumbar spine as described
above, with mild spinal canal stenosis at L3-L4 and moderate to
severe left neural foraminal narrowing at this level.
2. Moderate left neural foraminal narrowing at L2-3.
3. Mild-to-moderate left neural foraminal narrowing at L4-L5 .
4. Right asymmetric disc bulge at L5-S1 with narrowing of the right
subarticular zone and mild-to-moderate right neural foraminal
narrowing.

## 2023-01-05 IMAGING — CR DG HIP (WITH OR WITHOUT PELVIS) 2-3V*L*
3 series · 3 of 3 positions shown · non-contrast
Comparison: None.

CLINICAL DATA: Left hip pain.

EXAM:
DG HIP (WITH OR WITHOUT PELVIS) 2-3V LEFT

[w pelvis upright]
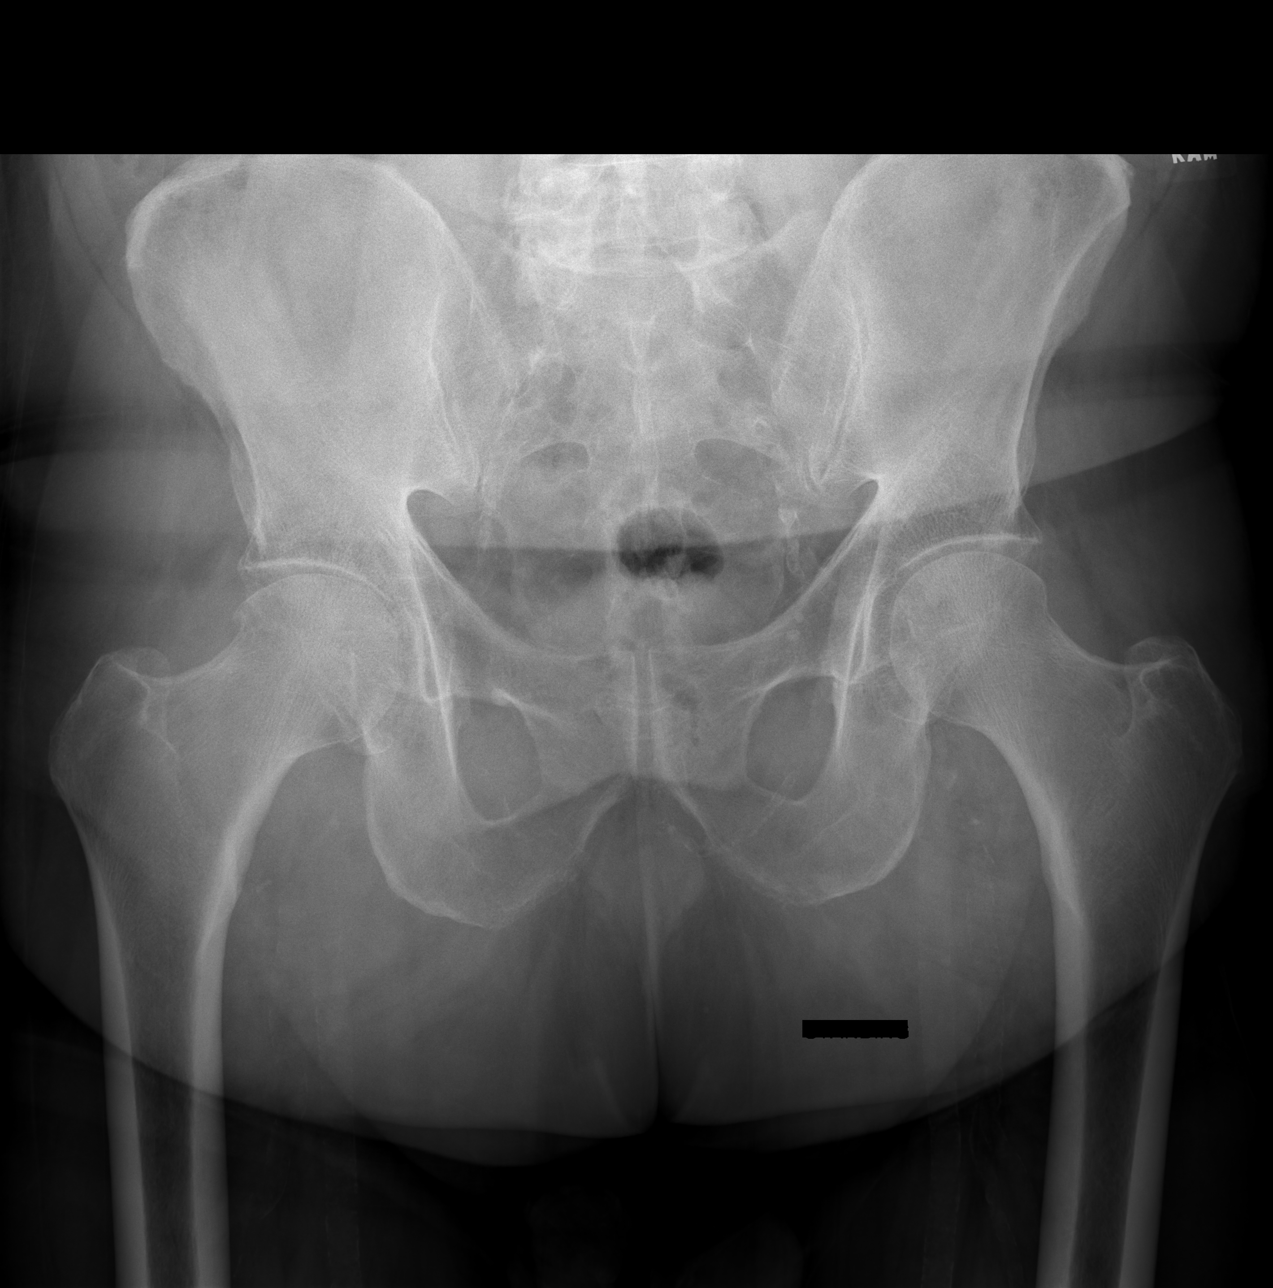

[w hip ap left]
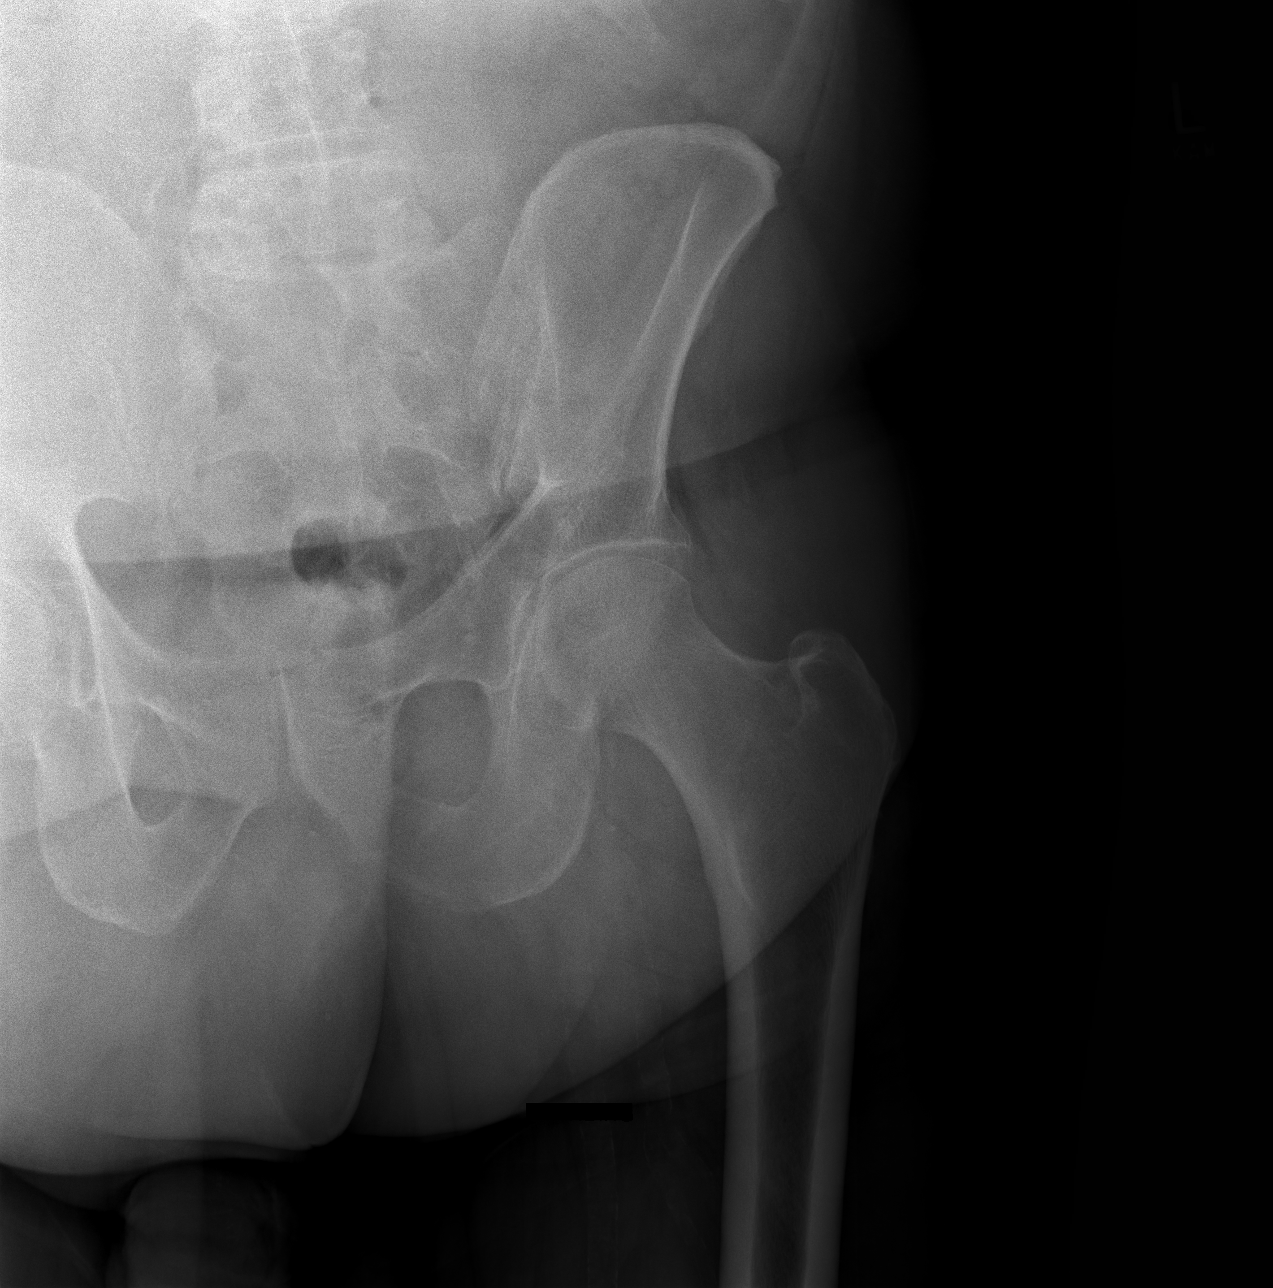

[w hip lat left]
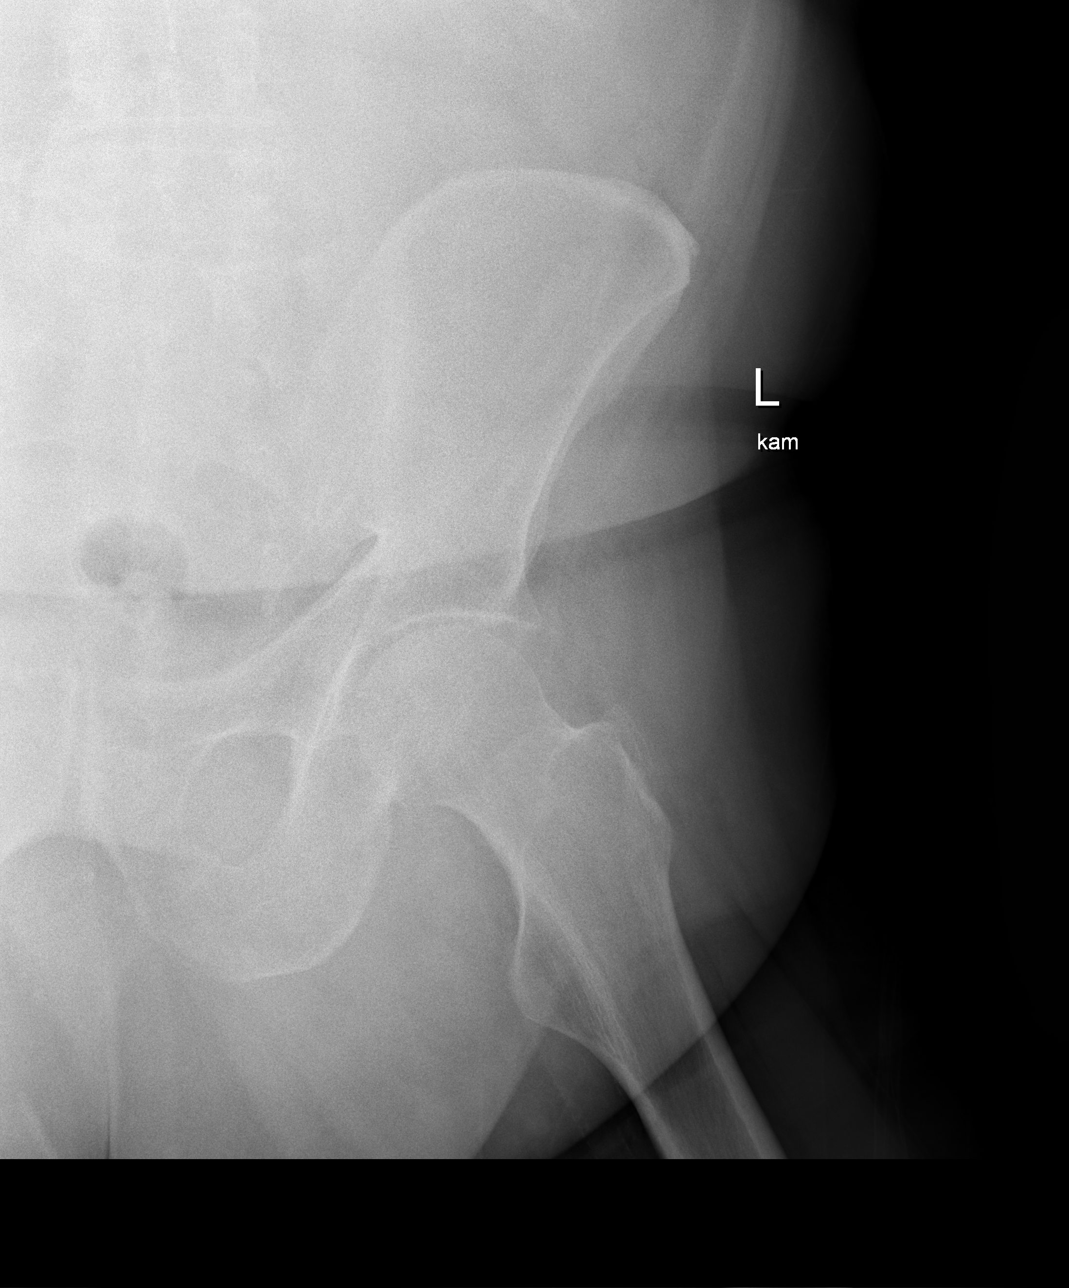

[3 of 3 positions shown; findings below may reference images not displayed]

FINDINGS: There is no evidence of hip fracture or dislocation. There is no
evidence of arthropathy or other focal bone abnormality.
IMPRESSION: Negative.

## 2023-01-07 NOTE — Progress Notes (Unsigned)
Cardiology Office Note    [name] Name: Erik [name] Date of Encounter: 01/08/2023  Primary Care Provider:  Mila Palmer, MD Primary Cardiologist:  Lance Muss, MD Primary Electrophysiologist: None   Past Medical History    Past Medical History:  Diagnosis Date   Arthritis    Coronary artery disease    GERD (gastroesophageal reflux disease)    History of echocardiogram    Echo 4/17 - mild concentric LVH, EF 55-60%, no RWMA, Gr 1 DD, mild AI, mild MR, mild LAE   Hypertension    Kidney stone    Obesity    Tinea corporis     History of Present Illness  Erik [name] is a 77 y.o. male with a PMH of CAD s/p LHC 06/2015 with CTO of RCA with left-to-right collaterals, relook LHC on 11/20/2022 that showed three-vessel CAD, CABG x 4 on 12/11/22, HTN, HLD who presents today for posthospital follow-up.  Erik [name] has been followed by Dr. Eldridge Dace since 2014 for complaints of shortness of breath with exertion and chest tightness.  During that time [name] was advised to undergo LHC however [name] declined at that time.  He continued to have complaints of chest tightness and underwent LHC in 06/2015 that showed 100% stenosed mid RCA with left-to-right collaterals, 60% stenosed second diagonal, 50% stenosed mid LAD, 70% stenosed ramus with no disease in Erik LAD critical enough for PCI.  He was continued on medical therapy.  He reported increased difficulty with chest tightness with activity and was seen on 11/15/2022 for evaluation and underwent LHC on 11/20/2022 that showed three-vessel CAD and referral to CVTS.  2D echo was completed showing EF of 70-75% with mild to moderate MVR.  He was evaluated by Dr. Cliffton Asters on 12/06/2022 and [name] was in agreement to proceed with procedure.  [name] underwent a CABG x 4 on 12/11/2022 with (LIMA LAD, RSVG PLV, OM, diagonal) . He developed some thrombocytopenia that resolved with platelets and AF that was controlled with amiodarone drip.   He continued to progress and was discharged on 12/18/22 in stable condition with amiodarone and Eliquis.  On 12/19/2022 [name] wife contacted Dr. Cliffton Asters with concerns of difficulty to move due to terrible pain in Erik back, hand and ankle. He completed an [place] that showed no evidence of DVT.  He was taken Erik prescribed Norco and Dr. Devra Dopp recommended prednisone taper for gouty arthritis.  He was also given a referral to [facility] for home PT.  He was seen on virtual visit on 12/27/2022 by Dr. Cliffton Asters and continued to have deconditioning and was referred to Erik ED for readmission and further physical therapy. During visit [name] had complaints of SOB [name] was evaluated by PT with recommendation for inpatient SNF. Per family [name] was not comfortable with SNF plan and preferred home health PT.  During today's visit Erik [name] reports that he has been doing well and no longer experiencing back and leg pain.  His blood pressure today was controlled at 110/68 and heart rate was 61 bpm.  He is euvolemic on examination and has trace lower extremity edema on Erik right leg only.  He has been compliant with his current medications and denies any adverse reactions.  He is currently being followed with PT and home health and per his wife he has been doing well with no changes.  He is staying active and has been walking up and down his stairs at home and also walks a whole block without his walker with  no complaints of chest pain dizziness or palpitations.  He is looking forward to beginning cardiac rehab in Erik next few weeks.  I advised him to follow-up with Dr. Cliffton Asters in his office prior to having clearance finalized for rehab.  Denies chest pain, palpitations, dyspnea, PND, orthopnea, nausea, vomiting, dizziness, syncope, edema, weight gain, or early satiety.  Review of Systems  Please see Erik history of present illness.    All other systems reviewed and are otherwise negative except as noted  above.  Physical Exam    Wt Readings from Last 3 Encounters:  01/08/23 214 lb 9.6 oz (97.3 kg)  12/27/22 225 lb (102.1 kg)  12/18/22 225 lb 3.2 oz (102.2 kg)   VS: Vitals:   01/08/23 1101  BP: 110/68  Pulse: 61  SpO2: 98%  ,Body mass index is 30.79 kg/m. GEN: Well nourished, well developed in no acute distress Neck: No JVD; No carotid bruits Pulmonary: Clear to auscultation without rales, wheezing or rhonchi  Cardiovascular: Normal rate. Regular rhythm. Normal S1. Normal S2.   Murmurs: There is no murmur.  ABDOMEN: Soft, non-tender, non-distended EXTREMITIES:  No edema; No deformity   EKG/LABS/ Recent Cardiac Studies   ECG personally reviewed by me today -none completed today  Risk Assessment/Calculations:    CHA2DS2-VASc Score = 4   This indicates a 4.8% annual risk of stroke. Erik [name] score is based upon: CHF History: 0 HTN History: 1 Diabetes History: 0 Stroke History: 0 Vascular Disease History: 1 Age Score: 2 Gender Score: 0         Lab Results  Component Value Date   WBC 12.9 (H) 12/27/2022   HGB 10.8 (L) 12/27/2022   HCT 33.3 (L) 12/27/2022   MCV 103.7 (H) 12/27/2022   PLT 846 (H) 12/27/2022   Lab Results  Component Value Date   CREATININE 1.04 12/27/2022   BUN 18 12/27/2022   NA 136 12/27/2022   K 3.9 12/27/2022   CL 98 12/27/2022   CO2 21 (L) 12/27/2022   Lab Results  Component Value Date   CHOL 174 04/22/2019   HDL 87 04/22/2019   LDLCALC 74 04/22/2019   TRIG 70 04/22/2019   CHOLHDL 2.0 04/22/2019    Lab Results  Component Value Date   HGBA1C 4.9 12/09/2022   Assessment & Plan    1.  History of CAD: -LHC completed 10/2020 with three-vessel CAD and recommendation for CVTS consult and CABG completed x 4 on 12/11/2022 and discharged in stable condition however developed failure to thrive and readmitted on 12/28/2022 with recommendation for SNF but now receiving home PT. -Today [name] reports no chest pain or anginal equivalent  with activity since surgery. -He has been feeling much better and able to walk without any foot or leg pain. -He is cleared to proceed with cardiac rehab once clearance is provided by thoracic surgery. -Continue PRN Lasix -Continue current GDMT with ASA 81 mg, Lipitor 40 mg, metoprolol 25 mg twice daily   2.  Essential hypertension: -[name] last blood pressure was well-controlled at 110/68 -Continue metoprolol 25 mg twice daily  3.  Hyperlipidemia: -[name] last LDL cholesterol was 81 -Continue Lipitor 40 mg daily  4.PAF: -currently on rate control with amiodarone and metoprolol with rate of 61 bpm today and no palpitations noted on examination. -[name] will wear a 14-day ZIO monitor to evaluate AF burden with plan to discontinue amiodarone -We will check TSH, CMET and CBC today for surveillance of amiodarone -Continue Eliquis 5 mg twice daily -  CHA2DS2-VASc Score = 4 [CHF History: 0, HTN History: 1, Diabetes History: 0, Stroke History: 0, Vascular Disease History: 1, Age Score: 2, Gender Score: 0].  Therefore, Erik [name] annual risk of stroke is 4.8 %.     5. DOE: -Improved with no reoccurrence since ED visit -[name] has tolerated activities with no difficulty    Cardiac Rehabilitation Eligibility Assessment       Disposition: Follow-up with Lance Muss, MD or APP in 1 months    Signed, Napoleon Form, Leodis Rains, NP 01/08/2023, 12:06 PM [facility]

## 2023-01-08 ENCOUNTER — Encounter: Payer: Self-pay | Admitting: Nurse Practitioner

## 2023-01-08 ENCOUNTER — Ambulatory Visit (INDEPENDENT_AMBULATORY_CARE_PROVIDER_SITE_OTHER): Payer: Medicare Other

## 2023-01-08 ENCOUNTER — Ambulatory Visit: Payer: Medicare Other | Attending: Nurse Practitioner | Admitting: Nurse Practitioner

## 2023-01-08 ENCOUNTER — Other Ambulatory Visit: Payer: Self-pay | Admitting: Nurse Practitioner

## 2023-01-08 VITALS — BP 110/68 | HR 61 | Ht 70.0 in | Wt 214.6 lb

## 2023-01-08 DIAGNOSIS — I480 Paroxysmal atrial fibrillation: Secondary | ICD-10-CM | POA: Diagnosis not present

## 2023-01-08 DIAGNOSIS — E782 Mixed hyperlipidemia: Secondary | ICD-10-CM | POA: Diagnosis not present

## 2023-01-08 DIAGNOSIS — R0609 Other forms of dyspnea: Secondary | ICD-10-CM

## 2023-01-08 DIAGNOSIS — I10 Essential (primary) hypertension: Secondary | ICD-10-CM

## 2023-01-08 DIAGNOSIS — I25110 Atherosclerotic heart disease of native coronary artery with unstable angina pectoris: Secondary | ICD-10-CM

## 2023-01-08 MED ORDER — APIXABAN 5 MG PO TABS: 5.0000 mg | ORAL_TABLET | Freq: Two times a day (BID) | ORAL | 2 refills | Status: DC

## 2023-01-08 MED ORDER — ATORVASTATIN CALCIUM 40 MG PO TABS: 40.0000 mg | ORAL_TABLET | Freq: Every day | ORAL | 2 refills | Status: DC

## 2023-01-08 MED ORDER — FUROSEMIDE 20 MG PO TABS: 20.0000 mg | ORAL_TABLET | Freq: Every day | ORAL | 0 refills | Status: AC | PRN

## 2023-01-08 MED ORDER — AMIODARONE HCL 200 MG PO TABS: ORAL_TABLET | ORAL | 2 refills | Status: DC

## 2023-01-08 NOTE — Progress Notes (Unsigned)
ZIO XT serial # [id number] from office inventory applied to patient.  Dr. Clifton James to read.

## 2023-01-08 NOTE — Patient Instructions (Signed)
Medication Instructions:  Take Lasix 20mg  of Lasix as needed *If you need a refill on your cardiac medications before your next appointment, please call your pharmacy*   Lab Work: TODAY-CBC, CMET & TSH  If you have labs (blood work) drawn today and your tests are completely normal, you will receive your results only by: MyChart Message (if you have MyChart) OR A paper copy in the mail If you have any lab test that is abnormal or we need to change your treatment, we will call you to review the results.   Testing/Procedures: Christena Deem- Long Term Monitor Instructions  Your physician has requested you wear a ZIO patch monitor for 14 days.  This is a single patch monitor. [employer] supplies one patch monitor per enrollment. Additional stickers are not available. Please do not apply patch if you will be having a Nuclear Stress Test,  Echocardiogram, Cardiac CT, MRI, or Chest Xray during the period you would be wearing the  monitor. The patch cannot be worn during these tests. You cannot remove and re-apply the  ZIO XT patch monitor.  Your ZIO patch monitor will be mailed 3 day USPS to your address on file. It may take 3-5 days  to receive your monitor after you have been enrolled.  Once you have received your monitor, please review the enclosed instructions. Your monitor  has already been registered assigning a specific monitor serial # to you.  Billing and Patient Assistance Program Information  We have supplied [employer] with any of your insurance information on file for billing purposes. [employer] offers a sliding scale Patient Assistance Program for patients that do not have  insurance, or whose insurance does not completely cover the cost of the ZIO monitor.  You must apply for the Patient Assistance Program to qualify for this discounted rate.  To apply, please call [employer] at [phone], select option 4, select option 2, ask to apply for  Patient Assistance Program. Meredeth Ide will ask your  household income, and how many people  are in your household. They will quote your out-of-pocket cost based on that information.  [employer] will also be able to set up a 52-month, interest-free payment plan if needed.  Applying the monitor   Shave hair from upper left chest.  Hold abrader disc by orange tab. Rub abrader in 40 strokes over the upper left chest as  indicated in your monitor instructions.  Clean area with 4 enclosed alcohol pads. Let dry.  Apply patch as indicated in monitor instructions. Patch will be placed under collarbone on left  side of chest with arrow pointing upward.  Rub patch adhesive wings for 2 minutes. Remove white label marked "1". Remove the white  label marked "2". Rub patch adhesive wings for 2 additional minutes.  While looking in a mirror, press and release button in center of patch. A small green light will  flash 3-4 times. This will be your only indicator that the monitor has been turned on.  Do not shower for the first 24 hours. You may shower after the first 24 hours.  Press the button if you feel a symptom. You will hear a small click. Record Date, Time and  Symptom in the Patient Logbook.  When you are ready to remove the patch, follow instructions on the last 2 pages of Patient  Logbook. Stick patch monitor onto the last page of Patient Logbook.  Place Patient Logbook in the blue and white box. Use locking tab on box and tape box  closed  securely. The blue and white box has prepaid postage on it. Please place it in the mailbox as  soon as possible. Your physician should have your test results approximately 7 days after the  monitor has been mailed back to [facility].  Call [facility] Customer Care at [phone] if you have questions regarding  your ZIO XT patch monitor. Call them immediately if you see an orange light blinking on your  monitor.  If your monitor falls off in less than 4 days, contact our Monitor department at [phone].   If your monitor becomes loose or falls off after 4 days call [employer] at [phone] for  suggestions on securing your monitor    Follow-Up: At [facility], you and your health needs are our priority.  As part of our continuing mission to provide you with exceptional heart care, we have created designated Provider Care Teams.  These Care Teams include your primary Cardiologist (physician) and Advanced Practice Providers (APPs -  Physician Assistants and Nurse Practitioners) who all work together to provide you with the care you need, when you need it.  We recommend signing up for the patient portal called "MyChart".  Sign up information is provided on this After Visit Summary.  MyChart is used to connect with patients for Virtual Visits (Telemedicine).  Patients are able to view lab/test results, encounter notes, upcoming appointments, etc.  Non-urgent messages can be sent to your provider as well.   To learn more about what you can do with MyChart, go to [email or web address].    Your next appointment:   1 month(s)  Provider:   Robin Searing, NP     Then, Earney Hamburg, MD will plan to see you again in 3 month(s).    Other Instructions PLEASE CONTACT YOUR THORACIC SURGEON FOR A POST OP FOLLOW UP

## 2023-01-09 LAB — COMPREHENSIVE METABOLIC PANEL
ALT: 40 [IU]/L (ref 0–44)
AST: 27 [IU]/L (ref 0–40)
Albumin: 3.8 g/dL (ref 3.8–4.8)
Alkaline Phosphatase: 105 [IU]/L (ref 44–121)
BUN/Creatinine Ratio: 12 (ref 10–24)
BUN: 14 mg/dL (ref 8–27)
Bilirubin Total: 0.5 mg/dL (ref 0.0–1.2)
CO2: 26 mmol/L (ref 20–29)
Calcium: 9.5 mg/dL (ref 8.6–10.2)
Chloride: 104 mmol/L (ref 96–106)
Creatinine, Ser: 1.14 mg/dL (ref 0.76–1.27)
Globulin, Total: 2.4 g/dL (ref 1.5–4.5)
Glucose: 96 mg/dL (ref 70–99)
Potassium: 4.7 mmol/L (ref 3.5–5.2)
Sodium: 142 mmol/L (ref 134–144)
Total Protein: 6.2 g/dL (ref 6.0–8.5)
eGFR: 66 mL/min/{1.73_m2} (ref >59)

## 2023-01-09 LAB — CBC
Hematocrit: 38.5 % (ref 37.5–51.0)
Hemoglobin: 12.4 g/dL — ABNORMAL LOW (ref 13.0–17.7)
MCH: 33.2 pg — ABNORMAL HIGH (ref 26.6–33.0)
MCHC: 32.2 g/dL (ref 31.5–35.7)
MCV: 103 fL — ABNORMAL HIGH (ref 79–97)
Platelets: 330 10*3/uL (ref 150–450)
RBC: 3.74 x10E6/uL — ABNORMAL LOW (ref 4.14–5.80)
RDW: 13.4 % (ref 11.6–15.4)
WBC: 10.5 10*3/uL (ref 3.4–10.8)

## 2023-01-09 LAB — TSH: TSH: 1.72 u[IU]/mL (ref 0.450–4.500)

## 2023-01-15 ENCOUNTER — Other Ambulatory Visit: Payer: Self-pay | Admitting: Cardiothoracic Surgery

## 2023-01-15 DIAGNOSIS — Z951 Presence of aortocoronary bypass graft: Secondary | ICD-10-CM

## 2023-01-16 ENCOUNTER — Ambulatory Visit (INDEPENDENT_AMBULATORY_CARE_PROVIDER_SITE_OTHER): Payer: Self-pay | Admitting: Cardiothoracic Surgery

## 2023-01-16 ENCOUNTER — Ambulatory Visit
Admission: RE | Admit: 2023-01-16 | Discharge: 2023-01-16 | Disposition: A | Discharge status: Home / Self Care | Payer: Medicare Other | Source: Ambulatory Visit | Attending: Cardiothoracic Surgery | Admitting: Cardiothoracic Surgery

## 2023-01-16 ENCOUNTER — Encounter: Payer: Self-pay | Admitting: Cardiothoracic Surgery

## 2023-01-16 ENCOUNTER — Other Ambulatory Visit: Payer: Self-pay

## 2023-01-16 VITALS — BP 109/71 | HR 63 | Resp 20 | Wt 222.0 lb

## 2023-01-16 DIAGNOSIS — Z951 Presence of aortocoronary bypass graft: Secondary | ICD-10-CM

## 2023-01-16 DIAGNOSIS — Z9889 Other specified postprocedural states: Secondary | ICD-10-CM | POA: Insufficient documentation

## 2023-01-16 DIAGNOSIS — L905 Scar conditions and fibrosis of skin: Secondary | ICD-10-CM

## 2023-01-16 NOTE — Progress Notes (Signed)
HPI: The patient returns for surgical followup with chest x-ray a month after multivessel CABG for symptomatic 3 vessel CAD.  He developed postoperative atrial fibrillation which converted to sinus rhythm and was discharged on amiodarone and Eliquis. The patient was seen in follow-up in the cardiology office earlier this month and had a Zio patch placed to monitor for A-fib and was directed to continue the amiodarone and Eliquis until follow-up visit with Dr. Clifton James.  Patient initially had difficulty with exacerbation of chronic back pain, weakness, and lower extremity edema.  He had a postop visit to the [facility] ED where leg Doppler rule out DVT and CT chest rule out PE.  He was arranged for home physical therapy and subsequently has done very well. He is walking daily without difficulty.  No sternal surgical pain.  Longstanding back pain has improved.  Edema has resolved.  He is interested in starting outpatient phase 2 cardiac rehab . Current Outpatient Medications  Medication Sig Dispense Refill   amiodarone (PACERONE) 200 MG tablet Take 200 mg by mouth daily.     amiodarone (PACERONE) 200 MG tablet Take  200 mg bid for one week;then take 200 mg daily thereafter 90 tablet 2   apixaban (ELIQUIS) 5 MG TABS tablet Take 1 tablet (5 mg total) by mouth 2 (two) times daily. 180 tablet 2   Ascorbic Acid (VITAMIN C) 500 MG CAPS Take 500 mg by mouth every evening. Chewable     aspirin EC 81 MG tablet Take 1 tablet (81 mg total) by mouth daily. Swallow whole.     atorvastatin (LIPITOR) 40 MG tablet Take 1 tablet (40 mg total) by mouth daily. 90 tablet 2   b complex vitamins capsule Take 1 capsule by mouth daily.     Calcium Carb-Cholecalciferol (CALCIUM 600 + D PO) Take 1 tablet by mouth every evening.     Cholecalciferol (VITAMIN D3) 5000 units CAPS Take 5,000 Units by mouth every morning.     colchicine 0.6 MG tablet Take 0.6 mg by mouth 2 (two) times daily as needed (gout).     ferrous sulfate 325  (65 FE) MG tablet Take 325 mg by mouth every evening.     fexofenadine (ALLEGRA) 180 MG tablet Take 180 mg by mouth daily.     folic acid (FOLVITE) 800 MCG tablet Take 800 mcg by mouth daily.     furosemide (LASIX) 20 MG tablet Take 1 tablet (20 mg total) by mouth daily as needed. 20 tablet 0   HYDROcodone-acetaminophen (NORCO) 10-325 MG tablet Take 4 tablets by mouth every 4 (four) hours as needed for moderate pain (pain score 4-6) or severe pain (pain score 7-10).     Melatonin 10 MG CAPS Take 10 mg by mouth at bedtime.     methocarbamol (ROBAXIN) 500 MG tablet Take 500 mg by mouth daily as needed for muscle spasms.     metoprolol tartrate (LOPRESSOR) 25 MG tablet TAKE 1 TABLET BY MOUTH TWICE A DAY 180 tablet 3   mirtazapine (REMERON) 30 MG tablet Take 30 mg by mouth at bedtime.     Multiple Vitamin (MULITIVITAMIN WITH MINERALS) TABS Take 1 tablet by mouth daily. Men     OVER THE COUNTER MEDICATION Take 1 tablet by mouth daily. Unforgettable memory supplement     pantoprazole (PROTONIX) 40 MG tablet Take 1 tablet (40 mg total) by mouth at bedtime.     potassium chloride SA (KLOR-CON M) 20 MEQ tablet Take 1 tablet (20 mEq total) by  mouth daily. For 10 days then stop (Patient not taking: Reported on 01/08/2023) 10 tablet 0   Selenium 200 MCG CAPS Take 200 mcg by mouth every morning.     vitamin B-12 (CYANOCOBALAMIN) 500 MCG tablet Take 500 mcg by mouth daily.     No current facility-administered medications for this visit.   Physical Exam: Blood pressure 109/71, pulse 63, resp. rate 20, weight 222 lb (100.7 kg), SpO2 95%.        Exam    General- alert and comfortable    Sternal incision stable and well-healed.  Leg incision for vein harvest well-healed.    Neck- no JVD, no cervical adenopathy palpable, no carotid bruit   Lungs- clear without rales, wheezes   Cor- regular rate and rhythm, no murmur , gallop   Abdomen- soft, non-tender   Extremities - warm, non-tender, minimal edema    Neuro- oriented, appropriate, no focal weakness     Diagnostic Tests: Images of today's chest x-ray personally reviewed demonstrating clear lung fields, no pleural effusion, stable cardiac silhouette and sternal wires intact.  Impression: Patient doing extremely well 1 month following multivessel CABG.  He will continue his current medications until he is seen back by cardiology. The patient understands he can now increase his activity level to include driving, lifting no more than 20 pounds, and a goal of a daily walk of  20-30 minutes.  He is a candidate for phase 2 cardiac rehab and referral will be made.  Plan: The patient will return for final 37-month postop visit in mid December to review progress and to discuss further change in activity limits.   Lovett Sox, MD [facility] [phone]

## 2023-01-16 NOTE — Progress Notes (Signed)
Outpatient referral placed for [facility] Cardiac rehab per Dr. Donata Clay.

## 2023-01-17 ENCOUNTER — Telehealth (HOSPITAL_COMMUNITY): Payer: Self-pay

## 2023-01-17 ENCOUNTER — Encounter (HOSPITAL_COMMUNITY): Payer: Self-pay

## 2023-01-17 NOTE — Telephone Encounter (Signed)
Attempted to call patient in regards to Cardiac Rehab - LM on VM Mailed letter 

## 2023-01-20 ENCOUNTER — Encounter (HOSPITAL_COMMUNITY): Payer: Self-pay

## 2023-01-20 ENCOUNTER — Telehealth (HOSPITAL_COMMUNITY): Payer: Self-pay

## 2023-01-20 NOTE — Telephone Encounter (Signed)
    Called patient to confirm Cardiac Rehab appointment tomorrow. RN completed Nursing Assessment with patient. Pt denies s/s of COVID. Directions and instructions provided for the appointment. Pt understands without assistance.

## 2023-01-21 ENCOUNTER — Encounter (HOSPITAL_COMMUNITY)
Admission: RE | Admit: 2023-01-21 | Discharge: 2023-01-21 | Disposition: A | Discharge status: Home / Self Care | Payer: Medicare Other | Source: Ambulatory Visit | Attending: Interventional Cardiology | Admitting: Interventional Cardiology

## 2023-01-21 VITALS — BP 110/70 | HR 60 | Ht 68.5 in | Wt 224.4 lb

## 2023-01-21 DIAGNOSIS — Z951 Presence of aortocoronary bypass graft: Secondary | ICD-10-CM | POA: Diagnosis present

## 2023-01-21 NOTE — Progress Notes (Signed)
Cardiac Individual Treatment Plan  Patient Details  Name: Erik [name] MRN: [id number] Date of Birth: Oct 05, 1945 Referring Provider:   Flowsheet Row INTENSIVE CARDIAC REHAB ORIENT from 01/21/2023 in [facility]  Referring Provider Lance Muss, MD       Initial Encounter Date:  Flowsheet Row INTENSIVE CARDIAC REHAB ORIENT from 01/21/2023 in [facility]  Date 01/21/23       Visit Diagnosis: 12/11/22 S/P CABG x 4  Patient's Home Medications on Admission:  Current Outpatient Medications:    amiodarone (PACERONE) 200 MG tablet, Take 200 mg by mouth daily., Disp: , Rfl:    apixaban (ELIQUIS) 5 MG TABS tablet, Take 1 tablet (5 mg total) by mouth 2 (two) times daily., Disp: 180 tablet, Rfl: 2   Ascorbic Acid (VITAMIN C) 500 MG CAPS, Take 500 mg by mouth every evening. Chewable, Disp: , Rfl:    aspirin EC 81 MG tablet, Take 1 tablet (81 mg total) by mouth daily. Swallow whole., Disp: , Rfl:    atorvastatin (LIPITOR) 40 MG tablet, Take 1 tablet (40 mg total) by mouth daily., Disp: 90 tablet, Rfl: 2   b complex vitamins capsule, Take 1 capsule by mouth daily., Disp: , Rfl:    Calcium Carb-Cholecalciferol (CALCIUM 600 + D PO), Take 1 tablet by mouth every evening., Disp: , Rfl:    Cholecalciferol (VITAMIN D3) 5000 units CAPS, Take 5,000 Units by mouth every morning., Disp: , Rfl:    colchicine 0.6 MG tablet, Take 0.6 mg by mouth 2 (two) times daily as needed (gout)., Disp: , Rfl:    ferrous sulfate 325 (65 FE) MG tablet, Take 325 mg by mouth every evening., Disp: , Rfl:    fexofenadine (ALLEGRA) 180 MG tablet, Take 180 mg by mouth daily., Disp: , Rfl:    Flaxseed, Linseed, (FLAXSEED OIL) 1000 MG CAPS, Take 1 capsule by mouth daily., Disp: , Rfl:    folic acid (FOLVITE) 800 MCG tablet, Take 800 mcg by mouth daily., Disp: , Rfl:    furosemide (LASIX) 20 MG tablet,  Take 1 tablet (20 mg total) by mouth daily as needed., Disp: 20 tablet, Rfl: 0   HYDROcodone-acetaminophen (NORCO) 10-325 MG tablet, Take 4 tablets by mouth every 4 (four) hours as needed for moderate pain (pain score 4-6) or severe pain (pain score 7-10)., Disp: , Rfl:    Melatonin 10 MG CAPS, Take 10 mg by mouth at bedtime., Disp: , Rfl:    methocarbamol (ROBAXIN) 500 MG tablet, Take 500 mg by mouth daily as needed for muscle spasms., Disp: , Rfl:    metoprolol tartrate (LOPRESSOR) 25 MG tablet, TAKE 1 TABLET BY MOUTH TWICE A DAY, Disp: 180 tablet, Rfl: 3   mirtazapine (REMERON) 30 MG tablet, Take 30 mg by mouth at bedtime., Disp: , Rfl:    Multiple Vitamin (MULITIVITAMIN WITH MINERALS) TABS, Take 1 tablet by mouth daily. Men, Disp: , Rfl:    OVER THE COUNTER MEDICATION, Take 1 tablet by mouth daily. Unforgettable memory supplement, Disp: , Rfl:    pantoprazole (PROTONIX) 40 MG tablet, Take 1 tablet (40 mg total) by mouth at bedtime., Disp: , Rfl:    Saw Palmetto 450 MG CAPS, Take 1 capsule by mouth daily., Disp: , Rfl:    Selenium 200 MCG CAPS, Take 200 mcg by mouth every morning., Disp: , Rfl:    vitamin B-12 (CYANOCOBALAMIN) 500 MCG tablet, Take  500 mcg by mouth daily., Disp: , Rfl:    vitamin E 180 MG (400 UNITS) capsule, Take 400 Units by mouth daily., Disp: , Rfl:    amiodarone (PACERONE) 200 MG tablet, Take  200 mg bid for one week;then take 200 mg daily thereafter (Patient not taking: Reported on 01/21/2023), Disp: 90 tablet, Rfl: 2   potassium chloride SA (KLOR-CON M) 20 MEQ tablet, Take 1 tablet (20 mEq total) by mouth daily. For 10 days then stop (Patient not taking: Reported on 01/21/2023), Disp: 10 tablet, Rfl: 0  Past Medical History: Past Medical History:  Diagnosis Date   Arthritis    Coronary artery disease    GERD (gastroesophageal reflux disease)    History of echocardiogram    Echo 4/17 - mild concentric LVH, EF 55-60%, no RWMA, Gr 1 DD, mild AI, mild MR, mild LAE    Hypertension    Kidney stone    Obesity    Tinea corporis     Tobacco Use: Social History   Tobacco Use  Smoking Status Never  Smokeless Tobacco Never    Labs: Review Flowsheet       Latest Ref Rng & Units 07/05/2009 04/22/2019 12/09/2022 12/11/2022  Labs for ITP Cardiac and Pulmonary Rehab  Cholestrol 100 - 199 mg/dL 086  578  - -  LDL (calc) 0 - 99 mg/dL 469  74  - -  HDL-C >62 mg/dL 95.28  87  - -  Trlycerides 0 - 149 mg/dL 41.3  70  - -  Hemoglobin A1c 4.8 - 5.6 % - - 4.9  -  PH, Arterial 7.35 - 7.45 - - 7.43  7.386  7.371  7.440  7.442  7.449  7.464  7.373   PCO2 arterial 32 - 48 mmHg - - 38  34.4  33.7  33.0  33.7  35.4  33.0  40.0   Bicarbonate 20.0 - 28.0 mmol/L - - 25.2  20.4  19.4  22.7  23.0  24.6  24.9  23.7  23.3   TCO2 22 - 32 mmol/L - - - 21  20  24  24  25  28  26  27  26  25  23  24  26    Acid-base deficit 0.0 - 2.0 mmol/L - - - 4.0  5.0  1.0  1.0  2.0   O2 Saturation % - - 99.2  98  99  99  100  100  83  100  100     Details       Multiple values from one day are sorted in reverse-chronological order         Capillary Blood Glucose: Lab Results  Component Value Date   GLUCAP 87 12/16/2022   GLUCAP 95 12/14/2022   GLUCAP 85 12/14/2022   GLUCAP 100 (H) 12/14/2022   GLUCAP 119 (H) 12/14/2022     Exercise Target Goals: Exercise Program Goal: Individual exercise prescription set using results from initial 6 min walk test and THRR while considering  patient's activity barriers and safety.   Exercise Prescription Goal: Initial exercise prescription builds to 30-45 minutes a day of aerobic activity, 2-3 days per week.  Home exercise guidelines will be given to patient during program as part of exercise prescription that the participant will acknowledge.  Activity Barriers & Risk Stratification:  Activity Barriers & Cardiac Risk Stratification - 01/21/23 1537       Activity Barriers & Cardiac Risk Stratification   Activity Barriers Balance  Concerns;Arthritis;Joint Problems;Back Problems;Deconditioning;Assistive Device;Left Knee Replacement;Right Knee Replacement;Neck/Spine Problems;Other (comment)    Comments Sternal Precautions    Cardiac Risk Stratification High             6 Minute Walk:  6 Minute Walk     Row Name 01/21/23 1450         6 Minute Walk   Phase Initial     Distance 932 feet     Walk Time 6 minutes     # of Rest Breaks 0     MPH 1.8     METS 1.5     RPE 13     Perceived Dyspnea  0     VO2 Peak 2.25     Symptoms Yes (comment)     Comments Chronic low back and rt hip pain 5/10     Resting HR 60 bpm     Resting BP 110/70     Resting Oxygen Saturation  98 %     Exercise Oxygen Saturation  during 6 min walk 100 %     Max Ex. HR 103 bpm     Max Ex. BP 130/78     2 Minute Post BP 122/70              Oxygen Initial Assessment:   Oxygen Re-Evaluation:   Oxygen Discharge (Final Oxygen Re-Evaluation):   Initial Exercise Prescription:  Initial Exercise Prescription - 01/21/23 1500       Date of Initial Exercise RX and Referring Provider   Date 01/21/23    Referring Provider Lance Muss, MD    Expected Discharge Date 04/16/23      Prescription Details   Frequency (times per week) 3    Duration Progress to 30 minutes of continuous aerobic without signs/symptoms of physical distress      Intensity   THRR 40-80% of Max Heartrate 57-114    Ratings of Perceived Exertion 11-13    Perceived Dyspnea 0-4      Progression   Progression Continue progressive overload as per policy without signs/symptoms or physical distress.      Resistance Training   Training Prescription Yes    Weight 3 lbs    Reps 10-15             Perform Capillary Blood Glucose checks as needed.  Exercise Prescription Changes:   Exercise Comments:   Exercise Goals and Review:   Exercise Goals     Row Name 01/21/23 1537             Exercise Goals   Increase Physical Activity Yes        Intervention Provide advice, education, support and counseling about physical activity/exercise needs.;Develop an individualized exercise prescription for aerobic and resistive training based on initial evaluation findings, risk stratification, comorbidities and participant's personal goals.       Expected Outcomes Short Term: Attend rehab on a regular basis to increase amount of physical activity.;Long Term: Exercising regularly at least 3-5 days a week.;Long Term: Add in home exercise to make exercise part of routine and to increase amount of physical activity.       Increase Strength and Stamina Yes       Intervention Provide advice, education, support and counseling about physical activity/exercise needs.;Develop an individualized exercise prescription for aerobic and resistive training based on initial evaluation findings, risk stratification, comorbidities and participant's personal goals.       Expected Outcomes Short Term: Increase workloads from initial exercise prescription for resistance,  speed, and METs.;Short Term: Perform resistance training exercises routinely during rehab and add in resistance training at home;Long Term: Improve cardiorespiratory fitness, muscular endurance and strength as measured by increased METs and functional capacity ( )       Able to understand and use rate of perceived exertion (RPE) scale Yes       Intervention Provide education and explanation on how to use RPE scale       Expected Outcomes Short Term: Able to use RPE daily in rehab to express subjective intensity level;Long Term:  Able to use RPE to guide intensity level when exercising independently       Knowledge and understanding of Target Heart Rate Range (THRR) Yes       Intervention Provide education and explanation of THRR including how the numbers were predicted and where they are located for reference       Expected Outcomes Short Term: Able to state/look up THRR;Long Term: Able to use THRR to  govern intensity when exercising independently;Short Term: Able to use daily as guideline for intensity in rehab       Understanding of Exercise Prescription Yes       Intervention Provide education, explanation, and written materials on patient's individual exercise prescription       Expected Outcomes Short Term: Able to explain program exercise prescription;Long Term: Able to explain home exercise prescription to exercise independently                Exercise Goals Re-Evaluation :   Discharge Exercise Prescription (Final Exercise Prescription Changes):   Nutrition:  Target Goals: Understanding of nutrition guidelines, daily intake of sodium 1500mg , cholesterol 200mg , calories 30% from fat and 7% or less from saturated fats, daily to have 5 or more servings of fruits and vegetables.  Biometrics:  Pre Biometrics - 01/21/23 1400       Pre Biometrics   Waist Circumference 47 inches    Hip Circumference 48.5 inches    Waist to Hip Ratio 0.97 %    Triceps Skinfold 27 mm    % Body Fat 35.8 %    Grip Strength 26 kg    Flexibility --   Not performed, significant back problems   Single Leg Stand 1.5 seconds              Nutrition Therapy Plan and Nutrition Goals:   Nutrition Assessments:  MEDIFICTS Score Key: >=70 Need to make dietary changes  40-70 Heart Healthy Diet <= 40 Therapeutic Level Cholesterol Diet    Picture Your Plate Scores: <16 Unhealthy dietary pattern with much room for improvement. 41-50 Dietary pattern unlikely to meet recommendations for good health and room for improvement. 51-60 More healthful dietary pattern, with some room for improvement.  >60 Healthy dietary pattern, although there may be some specific behaviors that could be improved.    Nutrition Goals Re-Evaluation:   Nutrition Goals Re-Evaluation:   Nutrition Goals Discharge (Final Nutrition Goals Re-Evaluation):   Psychosocial: Target Goals: Acknowledge presence or absence  of significant depression and/or stress, maximize coping skills, provide positive support system. Participant is able to verbalize types and ability to use techniques and skills needed for reducing stress and depression.  Initial Review & Psychosocial Screening:  Initial Psych Review & Screening - 01/21/23 1543       Initial Review   Current issues with Current Sleep Concerns      Family Dynamics   Good Support System? Yes   Pt has spouse, church friends, neighbors  Barriers   Psychosocial barriers to participate in program There are no identifiable barriers or psychosocial needs.      Screening Interventions   Interventions Encouraged to exercise             Quality of Life Scores:  Quality of Life - 01/21/23 1544       Quality of Life   Select Quality of Life      Quality of Life Scores   Health/Function Pre 26.77 %    Socioeconomic Pre 28 %    Psych/Spiritual Pre 30 %    Family Pre 30 %    GLOBAL Pre 28.26 %            Scores of 19 and below usually indicate a poorer quality of life in these areas.  A difference of  2-3 points is a clinically meaningful difference.  A difference of 2-3 points in the total score of the Quality of Life Index has been associated with significant improvement in overall quality of life, self-image, physical symptoms, and general health in studies assessing change in quality of life.  PHQ-9: Review Flowsheet       01/21/2023  Depression screen PHQ 2/9  Decreased Interest 0  Down, Depressed, Hopeless 0  PHQ - 2 Score 0  Altered sleeping 1  Tired, decreased energy 1  Change in appetite 0  Feeling bad or failure about yourself  0  Trouble concentrating 0  Moving slowly or fidgety/restless 0  Suicidal thoughts 0  PHQ-9 Score 2  Difficult doing work/chores Not difficult at all    Details           Interpretation of Total Score  Total Score Depression Severity:  1-4 = Minimal depression, 5-9 = Mild depression, 10-14 =  Moderate depression, 15-19 = Moderately severe depression, 20-27 = Severe depression   Psychosocial Evaluation and Intervention:   Psychosocial Re-Evaluation:   Psychosocial Discharge (Final Psychosocial Re-Evaluation):   Vocational Rehabilitation: Provide vocational rehab assistance to qualifying candidates.   Vocational Rehab Evaluation & Intervention:  Vocational Rehab - 01/21/23 1547       Initial Vocational Rehab Evaluation & Intervention   Assessment shows need for Vocational Rehabilitation No   Pt is retired            Education: Education Goals: Education classes will be provided on a weekly basis, covering required topics. Participant will state understanding/return demonstration of topics presented.     Core Videos: Exercise    Move It!  Clinical staff conducted group or individual video education with verbal and written material and guidebook.  Patient learns the recommended Pritikin exercise program. Exercise with the goal of living a long, healthy life. Some of the health benefits of exercise include controlled diabetes, healthier blood pressure levels, improved cholesterol levels, improved heart and lung capacity, improved sleep, and better body composition. Everyone should speak with their doctor before starting or changing an exercise routine.  Biomechanical Limitations Clinical staff conducted group or individual video education with verbal and written material and guidebook.  Patient learns how biomechanical limitations can impact exercise and how we can mitigate and possibly overcome limitations to have an impactful and balanced exercise routine.  Body Composition Clinical staff conducted group or individual video education with verbal and written material and guidebook.  Patient learns that body composition (ratio of muscle mass to fat mass) is a key component to assessing overall fitness, rather than body weight alone. Increased fat mass, especially  visceral belly fat,  can put [place] at increased risk for metabolic syndrome, type 2 diabetes, heart disease, and even death. It is recommended to combine diet and exercise (cardiovascular and resistance training) to improve your body composition. Seek guidance from your physician and exercise physiologist before implementing an exercise routine.  Exercise Action Plan Clinical staff conducted group or individual video education with verbal and written material and guidebook.  Patient learns the recommended strategies to achieve and enjoy long-term exercise adherence, including variety, self-motivation, self-efficacy, and positive decision making. Benefits of exercise include fitness, good health, weight management, more energy, better sleep, less stress, and overall well-being.  Medical   Heart Disease Risk Reduction Clinical staff conducted group or individual video education with verbal and written material and guidebook.  Patient learns our heart is our most vital organ as it circulates oxygen, nutrients, white blood cells, and hormones throughout the entire body, and carries waste away. Data supports a plant-based eating plan like the Pritikin Program for its effectiveness in slowing progression of and reversing heart disease. The video provides a number of recommendations to address heart disease.   Metabolic Syndrome and Belly Fat  Clinical staff conducted group or individual video education with verbal and written material and guidebook.  Patient learns what metabolic syndrome is, how it leads to heart disease, and how one can reverse it and keep it from coming back. You have metabolic syndrome if you have 3 of the following 5 criteria: abdominal obesity, high blood pressure, high triglycerides, low HDL cholesterol, and high blood sugar.  Hypertension and Heart Disease Clinical staff conducted group or individual video education with verbal and written material and guidebook.  Patient learns that  high blood pressure, or hypertension, is very common in the [place]. Hypertension is largely due to excessive salt intake, but other important risk factors include being overweight, physical inactivity, drinking too much alcohol, smoking, and not eating enough potassium from fruits and vegetables. High blood pressure is a leading risk factor for heart attack, stroke, congestive heart failure, dementia, kidney failure, and premature death. Long-term effects of excessive salt intake include stiffening of the arteries and thickening of heart muscle and organ damage. Recommendations include ways to reduce hypertension and the risk of heart disease.  Diseases of Our Time - Focusing on Diabetes Clinical staff conducted group or individual video education with verbal and written material and guidebook.  Patient learns why the best way to stop diseases of our time is prevention, through food and other lifestyle changes. Medicine (such as prescription pills and surgeries) is often only a Band-Aid on the problem, not a long-term solution. Most common diseases of our time include obesity, type 2 diabetes, hypertension, heart disease, and cancer. The Pritikin Program is recommended and has been proven to help reduce, reverse, and/or prevent the damaging effects of metabolic syndrome.  Nutrition   Overview of the Pritikin Eating Plan  Clinical staff conducted group or individual video education with verbal and written material and guidebook.  Patient learns about the Pritikin Eating Plan for disease risk reduction. The Pritikin Eating Plan emphasizes a wide variety of unrefined, minimally-processed carbohydrates, like fruits, vegetables, whole grains, and legumes. Go, Caution, and Stop food choices are explained. Plant-based and lean animal proteins are emphasized. Rationale provided for low sodium intake for blood pressure control, low added sugars for blood sugar stabilization, and low added fats and oils for  coronary artery disease risk reduction and weight management.  Calorie Density  Clinical staff conducted group or individual  video education with verbal and written material and guidebook.  Patient learns about calorie density and how it impacts the Pritikin Eating Plan. Knowing the characteristics of the food you choose will help you decide whether those foods will lead to weight gain or weight loss, and whether you want to consume more or less of them. Weight loss is usually a side effect of the Pritikin Eating Plan because of its focus on low calorie-dense foods.  Label Reading  Clinical staff conducted group or individual video education with verbal and written material and guidebook.  Patient learns about the Pritikin recommended label reading guidelines and corresponding recommendations regarding calorie density, added sugars, sodium content, and whole grains.  Dining Out - Part 1  Clinical staff conducted group or individual video education with verbal and written material and guidebook.  Patient learns that restaurant meals can be sabotaging because they can be so high in calories, fat, sodium, and/or sugar. Patient learns recommended strategies on how to positively address this and avoid unhealthy pitfalls.  Facts on Fats  Clinical staff conducted group or individual video education with verbal and written material and guidebook.  Patient learns that lifestyle modifications can be just as effective, if not more so, as many medications for lowering your risk of heart disease. A Pritikin lifestyle can help to reduce your risk of inflammation and atherosclerosis (cholesterol build-up, or plaque, in the artery walls). Lifestyle interventions such as dietary choices and physical activity address the cause of atherosclerosis. A review of the types of fats and their impact on blood cholesterol levels, along with dietary recommendations to reduce fat intake is also included.  Nutrition Action Plan   Clinical staff conducted group or individual video education with verbal and written material and guidebook.  Patient learns how to incorporate Pritikin recommendations into their lifestyle. Recommendations include planning and keeping personal health goals in mind as an important part of their success.  Healthy Mind-Set    Healthy Minds, Bodies, Hearts  Clinical staff conducted group or individual video education with verbal and written material and guidebook.  Patient learns how to identify when they are stressed. Video will discuss the impact of that stress, as well as the many benefits of stress management. Patient will also be introduced to stress management techniques. The way we think, act, and feel has an impact on our hearts.  How Our Thoughts Can Heal Our Hearts  Clinical staff conducted group or individual video education with verbal and written material and guidebook.  Patient learns that negative thoughts can cause depression and anxiety. This can result in negative lifestyle behavior and serious health problems. Cognitive behavioral therapy is an effective method to help control our thoughts in order to change and improve our emotional outlook.  Additional Videos:  Exercise    Improving Performance  Clinical staff conducted group or individual video education with verbal and written material and guidebook.  Patient learns to use a non-linear approach by alternating intensity levels and lengths of time spent exercising to help burn more calories and lose more body fat. Cardiovascular exercise helps improve heart health, metabolism, hormonal balance, blood sugar control, and recovery from fatigue. Resistance training improves strength, endurance, balance, coordination, reaction time, metabolism, and muscle mass. Flexibility exercise improves circulation, posture, and balance. Seek guidance from your physician and exercise physiologist before implementing an exercise routine and learn  your capabilities and proper form for all exercise.  Introduction to Yoga  Clinical staff conducted group or individual video education with  verbal and written material and guidebook.  Patient learns about yoga, a discipline of the coming together of mind, breath, and body. The benefits of yoga include improved flexibility, improved range of motion, better posture and core strength, increased lung function, weight loss, and positive self-image. Yoga's heart health benefits include lowered blood pressure, healthier heart rate, decreased cholesterol and triglyceride levels, improved immune function, and reduced stress. Seek guidance from your physician and exercise physiologist before implementing an exercise routine and learn your capabilities and proper form for all exercise.  Medical   Aging: Enhancing Your Quality of Life  Clinical staff conducted group or individual video education with verbal and written material and guidebook.  Patient learns key strategies and recommendations to stay in good physical health and enhance quality of life, such as prevention strategies, having an advocate, securing a Health Care Proxy and Power of Attorney, and keeping a list of medications and system for tracking them. It also discusses how to avoid risk for bone loss.  Biology of Weight Control  Clinical staff conducted group or individual video education with verbal and written material and guidebook.  Patient learns that weight gain occurs because we consume more calories than we burn (eating more, moving less). Even if your body weight is normal, you may have higher ratios of fat compared to muscle mass. Too much body fat puts you at increased risk for cardiovascular disease, heart attack, stroke, type 2 diabetes, and obesity-related cancers. In addition to exercise, following the Pritikin Eating Plan can help reduce your risk.  Decoding Lab Results  Clinical staff conducted group or individual video education  with verbal and written material and guidebook.  Patient learns that lab test reflects one measurement whose values change over time and are influenced by many factors, including medication, stress, sleep, exercise, food, hydration, pre-existing medical conditions, and more. It is recommended to use the knowledge from this video to become more involved with your lab results and evaluate your numbers to speak with your doctor.   Diseases of Our Time - Overview  Clinical staff conducted group or individual video education with verbal and written material and guidebook.  Patient learns that according to the CDC, 50% to 70% of chronic diseases (such as obesity, type 2 diabetes, elevated lipids, hypertension, and heart disease) are avoidable through lifestyle improvements including healthier food choices, listening to satiety cues, and increased physical activity.  Sleep Disorders Clinical staff conducted group or individual video education with verbal and written material and guidebook.  Patient learns how good quality and duration of sleep are important to overall health and well-being. Patient also learns about sleep disorders and how they impact health along with recommendations to address them, including discussing with a physician.  Nutrition  Dining Out - Part 2 Clinical staff conducted group or individual video education with verbal and written material and guidebook.  Patient learns how to plan ahead and communicate in order to maximize their dining experience in a healthy and nutritious manner. Included are recommended food choices based on the type of restaurant the patient is visiting.   Fueling a Banker conducted group or individual video education with verbal and written material and guidebook.  There is a strong connection between our food choices and our health. Diseases like obesity and type 2 diabetes are very prevalent and are in large-part due to lifestyle  choices. The Pritikin Eating Plan provides plenty of food and hunger-curbing satisfaction. It is easy to follow, affordable,  and helps reduce health risks.  Menu Workshop  Clinical staff conducted group or individual video education with verbal and written material and guidebook.  Patient learns that restaurant meals can sabotage health goals because they are often packed with calories, fat, sodium, and sugar. Recommendations include strategies to plan ahead and to communicate with the manager, chef, or server to help order a healthier meal.  Planning Your Eating Strategy  Clinical staff conducted group or individual video education with verbal and written material and guidebook.  Patient learns about the Pritikin Eating Plan and its benefit of reducing the risk of disease. The Pritikin Eating Plan does not focus on calories. Instead, it emphasizes high-quality, nutrient-rich foods. By knowing the characteristics of the foods, we choose, we can determine their calorie density and make informed decisions.  Targeting Your Nutrition Priorities  Clinical staff conducted group or individual video education with verbal and written material and guidebook.  Patient learns that lifestyle habits have a tremendous impact on disease risk and progression. This video provides eating and physical activity recommendations based on your personal health goals, such as reducing LDL cholesterol, losing weight, preventing or controlling type 2 diabetes, and reducing high blood pressure.  Vitamins and Minerals  Clinical staff conducted group or individual video education with verbal and written material and guidebook.  Patient learns different ways to obtain key vitamins and minerals, including through a recommended healthy diet. It is important to discuss all supplements you take with your doctor.   Healthy Mind-Set    Smoking Cessation  Clinical staff conducted group or individual video education with verbal and  written material and guidebook.  Patient learns that cigarette smoking and tobacco addiction pose a serious health risk which affects millions of people. Stopping smoking will significantly reduce the risk of heart disease, lung disease, and many forms of cancer. Recommended strategies for quitting are covered, including working with your doctor to develop a successful plan.  Culinary   Becoming a Set designer conducted group or individual video education with verbal and written material and guidebook.  Patient learns that cooking at home can be healthy, cost-effective, quick, and puts them in control. Keys to cooking healthy recipes will include looking at your recipe, assessing your equipment needs, planning ahead, making it simple, choosing cost-effective seasonal ingredients, and limiting the use of added fats, salts, and sugars.  Cooking - Breakfast and Snacks  Clinical staff conducted group or individual video education with verbal and written material and guidebook.  Patient learns how important breakfast is to satiety and nutrition through the entire day. Recommendations include key foods to eat during breakfast to help stabilize blood sugar levels and to prevent overeating at meals later in the day. Planning ahead is also a key component.  Cooking - Educational psychologist conducted group or individual video education with verbal and written material and guidebook.  Patient learns eating strategies to improve overall health, including an approach to cook more at home. Recommendations include thinking of animal protein as a side on your plate rather than center stage and focusing instead on lower calorie dense options like vegetables, fruits, whole grains, and plant-based proteins, such as beans. Making sauces in large quantities to freeze for later and leaving the skin on your vegetables are also recommended to maximize your experience.  Cooking - Healthy Salads and  Dressing Clinical staff conducted group or individual video education with verbal and written material and guidebook.  Patient learns that  vegetables, fruits, whole grains, and legumes are the foundations of the Pritikin Eating Plan. Recommendations include how to incorporate each of these in flavorful and healthy salads, and how to create homemade salad dressings. Proper handling of ingredients is also covered. Cooking - Soups and [employer] - Soups and Desserts Clinical staff conducted group or individual video education with verbal and written material and guidebook.  Patient learns that Pritikin soups and desserts make for easy, nutritious, and delicious snacks and meal components that are low in sodium, fat, sugar, and calorie density, while high in vitamins, minerals, and filling fiber. Recommendations include simple and healthy ideas for soups and desserts.   Overview     The Pritikin Solution Program Overview Clinical staff conducted group or individual video education with verbal and written material and guidebook.  Patient learns that the results of the Pritikin Program have been documented in more than 100 articles published in peer-reviewed journals, and the benefits include reducing risk factors for (and, in some cases, even reversing) high cholesterol, high blood pressure, type 2 diabetes, obesity, and more! An overview of the three key pillars of the Pritikin Program will be covered: eating well, doing regular exercise, and having a healthy mind-set.  WORKSHOPS  Exercise: Exercise Basics: Building Your Action Plan Clinical staff led group instruction and group discussion with PowerPoint presentation and patient guidebook. To enhance the learning environment the use of posters, models and videos may be added. At the conclusion of this workshop, patients will comprehend the difference between physical activity and exercise, as well as the benefits of incorporating both, into  their routine. Patients will understand the FITT (Frequency, Intensity, Time, and Type) principle and how to use it to build an exercise action plan. In addition, safety concerns and other considerations for exercise and cardiac rehab will be addressed by the presenter. The purpose of this lesson is to promote a comprehensive and effective weekly exercise routine in order to improve patients' overall level of fitness.   Managing Heart Disease: Your Path to a Healthier Heart Clinical staff led group instruction and group discussion with PowerPoint presentation and patient guidebook. To enhance the learning environment the use of posters, models and videos may be added.At the conclusion of this workshop, patients will understand the anatomy and physiology of the heart. Additionally, they will understand how Pritikin's three pillars impact the risk factors, the progression, and the management of heart disease.  The purpose of this lesson is to provide a high-level overview of the heart, heart disease, and how the Pritikin lifestyle positively impacts risk factors.  Exercise Biomechanics Clinical staff led group instruction and group discussion with PowerPoint presentation and patient guidebook. To enhance the learning environment the use of posters, models and videos may be added. Patients will learn how the structural parts of their bodies function and how these functions impact their daily activities, movement, and exercise. Patients will learn how to promote a neutral spine, learn how to manage pain, and identify ways to improve their physical movement in order to promote healthy living. The purpose of this lesson is to expose patients to common physical limitations that impact physical activity. Participants will learn practical ways to adapt and manage aches and pains, and to minimize their effect on regular exercise. Patients will learn how to maintain good posture while sitting, walking, and  lifting.  Balance Training and Fall Prevention  Clinical staff led group instruction and group discussion with PowerPoint presentation and patient guidebook. To enhance the  learning environment the use of posters, models and videos may be added. At the conclusion of this workshop, patients will understand the importance of their sensorimotor skills (vision, proprioception, and the vestibular system) in maintaining their ability to balance as they age. Patients will apply a variety of balancing exercises that are appropriate for their current level of function. Patients will understand the common causes for poor balance, possible solutions to these problems, and ways to modify their physical environment in order to minimize their fall risk. The purpose of this lesson is to teach patients about the importance of maintaining balance as they age and ways to minimize their risk of falling.  WORKSHOPS   Nutrition:  Fueling a Ship broker led group instruction and group discussion with PowerPoint presentation and patient guidebook. To enhance the learning environment the use of posters, models and videos may be added. Patients will review the foundational principles of the Pritikin Eating Plan and understand what constitutes a serving size in each of the food groups. Patients will also learn Pritikin-friendly foods that are better choices when away from home and review make-ahead meal and snack options. Calorie density will be reviewed and applied to three nutrition priorities: weight maintenance, weight loss, and weight gain. The purpose of this lesson is to reinforce (in a group setting) the key concepts around what patients are recommended to eat and how to apply these guidelines when away from home by planning and selecting Pritikin-friendly options. Patients will understand how calorie density may be adjusted for different weight management goals.  Mindful Eating  Clinical staff led  group instruction and group discussion with PowerPoint presentation and patient guidebook. To enhance the learning environment the use of posters, models and videos may be added. Patients will briefly review the concepts of the Pritikin Eating Plan and the importance of low-calorie dense foods. The concept of mindful eating will be introduced as well as the importance of paying attention to internal hunger signals. Triggers for non-hunger eating and techniques for dealing with triggers will be explored. The purpose of this lesson is to provide patients with the opportunity to review the basic principles of the Pritikin Eating Plan, discuss the value of eating mindfully and how to measure internal cues of hunger and fullness using the Hunger Scale. Patients will also discuss reasons for non-hunger eating and learn strategies to use for controlling emotional eating.  Targeting Your Nutrition Priorities Clinical staff led group instruction and group discussion with PowerPoint presentation and patient guidebook. To enhance the learning environment the use of posters, models and videos may be added. Patients will learn how to determine their genetic susceptibility to disease by reviewing their family history. Patients will gain insight into the importance of diet as part of an overall healthy lifestyle in mitigating the impact of genetics and other environmental insults. The purpose of this lesson is to provide patients with the opportunity to assess their personal nutrition priorities by looking at their family history, their own health history and current risk factors. Patients will also be able to discuss ways of prioritizing and modifying the Pritikin Eating Plan for their highest risk areas  Menu  Clinical staff led group instruction and group discussion with PowerPoint presentation and patient guidebook. To enhance the learning environment the use of posters, models and videos may be added. Using menus  brought in from [employer], or printed from [employer], patients will apply the Pritikin dining out guidelines that were presented in the [employer]  Out educational video. Patients will also be able to practice these guidelines in a variety of provided scenarios. The purpose of this lesson is to provide patients with the opportunity to practice hands-on learning of the Pritikin Dining Out guidelines with actual menus and practice scenarios.  Label Reading Clinical staff led group instruction and group discussion with PowerPoint presentation and patient guidebook. To enhance the learning environment the use of posters, models and videos may be added. Patients will review and discuss the Pritikin label reading guidelines presented in Pritikin's Label Reading Educational series video. Using fool labels brought in from local grocery stores and markets, patients will apply the label reading guidelines and determine if the packaged food meet the Pritikin guidelines. The purpose of this lesson is to provide patients with the opportunity to review, discuss, and practice hands-on learning of the Pritikin Label Reading guidelines with actual packaged food labels. Cooking School  Pritikin's [employer] are designed to teach patients ways to prepare quick, simple, and affordable recipes at home. The importance of nutrition's role in chronic disease risk reduction is reflected in its emphasis in the overall Pritikin program. By learning how to prepare essential core Pritikin Eating Plan recipes, patients will increase control over what they eat; be able to customize the flavor of foods without the use of added salt, sugar, or fat; and improve the quality of the food they consume. By learning a set of core recipes which are easily assembled, quickly prepared, and affordable, patients are more likely to prepare more healthy foods at home. These workshops focus on convenient breakfasts, simple  entres, side dishes, and desserts which can be prepared with minimal effort and are consistent with nutrition recommendations for cardiovascular risk reduction. Cooking [employer] are taught by a Armed forces logistics/support/administrative officer (RD) who has been trained by the [employer]. The chef or RD has a clear understanding of the importance of minimizing - if not completely eliminating - added fat, sugar, and sodium in recipes. Throughout the series of Cooking School Workshop sessions, patients will learn about healthy ingredients and efficient methods of cooking to build confidence in their capability to prepare    Cooking School weekly topics:  Adding Flavor- Sodium-Free  Fast and Healthy Breakfasts  Powerhouse Plant-Based Proteins  Satisfying Salads and Dressings  Simple Sides and Sauces  International Cuisine-Spotlight on the [employer] Zones  Delicious Desserts  Savory Soups  [employer] - Meals in a Astronomer Appetizers and Snacks  Comforting Weekend Breakfasts  One-Pot Wonders   Fast Evening Meals  Landscape architect Your Pritikin Plate  WORKSHOPS   Healthy Mindset (Psychosocial):  Focused Goals, Sustainable Changes Clinical staff led group instruction and group discussion with PowerPoint presentation and patient guidebook. To enhance the learning environment the use of posters, models and videos may be added. Patients will be able to apply effective goal setting strategies to establish at least one personal goal, and then take consistent, meaningful action toward that goal. They will learn to identify common barriers to achieving personal goals and develop strategies to overcome them. Patients will also gain an understanding of how our mind-set can impact our ability to achieve goals and the importance of cultivating a positive and growth-oriented mind-set. The purpose of this lesson is to provide patients with a deeper understanding of how to set and  achieve personal goals, as well as the tools and strategies needed to overcome common obstacles which may arise along the way.  From Head to Heart: The Power of a Healthy Outlook  Clinical staff led group instruction and group discussion with PowerPoint presentation and patient guidebook. To enhance the learning environment the use of posters, models and videos may be added. Patients will be able to recognize and describe the impact of emotions and mood on physical health. They will discover the importance of self-care and explore self-care practices which may work for them. Patients will also learn how to utilize the 4 C's to cultivate a healthier outlook and better manage stress and challenges. The purpose of this lesson is to demonstrate to patients how a healthy outlook is an essential part of maintaining good health, especially as they continue their cardiac rehab journey.  Healthy Sleep for a Healthy Heart Clinical staff led group instruction and group discussion with PowerPoint presentation and patient guidebook. To enhance the learning environment the use of posters, models and videos may be added. At the conclusion of this workshop, patients will be able to demonstrate knowledge of the importance of sleep to overall health, well-being, and quality of life. They will understand the symptoms of, and treatments for, common sleep disorders. Patients will also be able to identify daytime and nighttime behaviors which impact sleep, and they will be able to apply these tools to help manage sleep-related challenges. The purpose of this lesson is to provide patients with a general overview of sleep and outline the importance of quality sleep. Patients will learn about a few of the most common sleep disorders. Patients will also be introduced to the concept of "sleep hygiene," and discover ways to self-manage certain sleeping problems through simple daily behavior changes. Finally, the workshop will motivate  patients by clarifying the links between quality sleep and their goals of heart-healthy living.   Recognizing and Reducing Stress Clinical staff led group instruction and group discussion with PowerPoint presentation and patient guidebook. To enhance the learning environment the use of posters, models and videos may be added. At the conclusion of this workshop, patients will be able to understand the types of stress reactions, differentiate between acute and chronic stress, and recognize the impact that chronic stress has on their health. They will also be able to apply different coping mechanisms, such as reframing negative self-talk. Patients will have the opportunity to practice a variety of stress management techniques, such as deep abdominal breathing, progressive muscle relaxation, and/or guided imagery.  The purpose of this lesson is to educate patients on the role of stress in their lives and to provide healthy techniques for coping with it.  Learning Barriers/Preferences:  Learning Barriers/Preferences - 01/21/23 1545       Learning Barriers/Preferences   Learning Barriers Sight;Hearing   HOH, does not wear hearing aids. Reading glasses   Learning Preferences Audio;Computer/Internet;Group Instruction;Individual Instruction;Pictoral;Skilled Demonstration;Verbal Instruction;Video;Written Material             Education Topics:  Knowledge Questionnaire Score:  Knowledge Questionnaire Score - 01/21/23 1544       Knowledge Questionnaire Score   Pre Score 21/24             Core Components/Risk Factors/Patient Goals at Admission:  Personal Goals and Risk Factors at Admission - 01/21/23 1546       Core Components/Risk Factors/Patient Goals on Admission    Weight Management Yes;Obesity;Weight Loss    Intervention Weight Management: Develop a combined nutrition and exercise program designed to reach desired caloric intake, while maintaining appropriate intake of nutrient and  fiber, sodium and fats, and  appropriate energy expenditure required for the weight goal.;Weight Management: Provide education and appropriate resources to help participant work on and attain dietary goals.;Weight Management/Obesity: Establish reasonable short term and long term weight goals.;Obesity: Provide education and appropriate resources to help participant work on and attain dietary goals.    Admit Weight 224 lb 6.9 oz (101.8 kg)    Goal Weight: Long Term 200 lb (90.7 kg)    Expected Outcomes Short Term: Continue to assess and modify interventions until short term weight is achieved;Long Term: Adherence to nutrition and physical activity/exercise program aimed toward attainment of established weight goal;Weight Loss: Understanding of general recommendations for a balanced deficit meal plan, which promotes 1-2 lb weight loss per week and includes a negative energy balance of [phone] kcal/d;Understanding recommendations for meals to include 15-35% energy as protein, 25-35% energy from fat, 35-60% energy from carbohydrates, less than 200mg  of dietary cholesterol, 20-35 gm of total fiber daily;Understanding of distribution of calorie intake throughout the day with the consumption of 4-5 meals/snacks    Hypertension Yes    Intervention Provide education on lifestyle modifcations including regular physical activity/exercise, weight management, moderate sodium restriction and increased consumption of fresh fruit, vegetables, and low fat dairy, alcohol moderation, and smoking cessation.;Monitor prescription use compliance.    Expected Outcomes Short Term: Continued assessment and intervention until BP is < 140/35mm HG in hypertensive participants. < 130/72mm HG in hypertensive participants with diabetes, heart failure or chronic kidney disease.;Long Term: Maintenance of blood pressure at goal levels.    Lipids Yes    Intervention Provide education and support for participant on nutrition & aerobic/resistive  exercise along with prescribed medications to achieve LDL 70mg , HDL >40mg .    Expected Outcomes Short Term: Participant states understanding of desired cholesterol values and is compliant with medications prescribed. Participant is following exercise prescription and nutrition guidelines.;Long Term: Cholesterol controlled with medications as prescribed, with individualized exercise RX and with personalized nutrition plan. Value goals: LDL < 70mg , HDL > 40 mg.             Core Components/Risk Factors/Patient Goals Review:    Core Components/Risk Factors/Patient Goals at Discharge (Final Review):    ITP Comments:  ITP Comments     Row Name 01/21/23 1321           ITP Comments Armanda Magic, MD:  Introduction to the [facility]/Intensive Cardiac Rehab. Initial orientation packet reviewed with the patient.                Comments: Participant attended orientation for the cardiac rehabilitation program on  01/21/2023  to perform initial intake and exercise walk test. Patient introduced to the [facility] education and orientation packet was reviewed. Completed 6-minute walk test, measurements, initial ITP, and exercise prescription. Vital signs stable. Telemetry-normal sinus, RBBB rhythm, asymptomatic.   Service time was from 13:10 to 15:25.

## 2023-01-21 NOTE — Progress Notes (Signed)
Cardiac Rehab Medication Review   Does the patient  feel that his/her medications are working for him/her?  yes  Has the patient been experiencing any side effects to the medications prescribed?  no  Does the patient measure his/her own blood pressure or blood glucose at home?  no   Does the patient have any problems obtaining medications due to transportation or finances?   no  Understanding of regimen: good Understanding of indications: excellent Potential of compliance: excellent    Comments: Pt has not been checking BP at home as the home health RN has been doing that for them. Spouse with patient and handles the medications.    Lorin Picket 01/21/2023 1:40 PM

## 2023-01-27 ENCOUNTER — Encounter (HOSPITAL_COMMUNITY)
Admission: RE | Admit: 2023-01-27 | Discharge: 2023-01-27 | Disposition: A | Discharge status: Home / Self Care | Payer: Medicare Other | Source: Ambulatory Visit | Attending: Interventional Cardiology | Admitting: Interventional Cardiology

## 2023-01-27 DIAGNOSIS — Z48812 Encounter for surgical aftercare following surgery on the circulatory system: Secondary | ICD-10-CM | POA: Insufficient documentation

## 2023-01-27 DIAGNOSIS — Z951 Presence of aortocoronary bypass graft: Secondary | ICD-10-CM | POA: Diagnosis present

## 2023-01-27 NOTE — Progress Notes (Signed)
Daily Session Note  Patient Details  Name: Erik [name] MRN: [id number] Date of Birth: 1945-07-27 Referring Provider:   Flowsheet Row INTENSIVE CARDIAC REHAB ORIENT from 01/21/2023 in [facility]  Referring Provider Lance Muss, MD       Encounter Date: 01/27/2023  Check In:  Session Check In - 01/27/23 1512       Check-In   Supervising physician immediately available to respond to emergencies [facility] MD immediately available    Physician(s) Carlos Levering, DNP    Location [facility]    Staff Present Lorin Picket, MS, ACSM-CEP, CCRP, Exercise Physiologist;Emerie Vanderkolk, RN, Zachery Conch, MS, ACSM-CEP, Exercise Physiologist;Johnny Hale Bogus, MS, Exercise Physiologist;Jetta Walker BS, ACSM-CEP, Exercise Physiologist    Virtual Visit No    Medication changes reported     No    Fall or balance concerns reported    No    Tobacco Cessation No Change    Warm-up and Cool-down Performed as group-led instruction    Resistance Training Performed Yes    VAD Patient? No    PAD/SET Patient? No      Pain Assessment   Currently in Pain? No/denies    Pain Score 0-No pain    Multiple Pain Sites No             Capillary Blood Glucose: No results found for this or any previous visit (from the past 24 hour(s)).   Exercise Prescription Changes - 01/27/23 1600       Response to Exercise   Blood Pressure (Admit) 114/70    Blood Pressure (Exercise) 122/70    Blood Pressure (Exit) 110/70    Heart Rate (Admit) 60 bpm    Heart Rate (Exercise) 100 bpm    Heart Rate (Exit) 69 bpm    Rating of Perceived Exertion (Exercise) 12.5    Perceived Dyspnea (Exercise) 0    Symptoms 0    Comments Pt first day in the [facility] program    Duration Progress to 30 minutes of  aerobic without signs/symptoms of physical distress    Intensity THRR unchanged      Progression   Progression Continue to  progress workloads to maintain intensity without signs/symptoms of physical distress.    Average METs 1.98      Resistance Training   Training Prescription Yes    Weight 3 lbs    Reps 10-15    Time 10 Minutes      Recumbant Bike   Level 1    RPM 49    Watts 11    Minutes 15    METs 1.8      Track   Laps 9    Minutes 15    METs 2.15             Social History   Tobacco Use  Smoking Status Never  Smokeless Tobacco Never    Goals Met:  Exercise tolerated well No report of concerns or symptoms today Strength training completed today  Goals Unmet:  Not Applicable  Comments: Pt started cardiac rehab today.  Pt tolerated light exercise without difficulty. VSS, telemetry-***, asymptomatic.  Medication list reconciled. Pt denies barriers to medicaiton compliance.  PSYCHOSOCIAL ASSESSMENT:  PHQ-***. Pt exhibits positive coping skills, hopeful outlook with supportive family. No psychosocial needs identified at this time, no psychosocial interventions necessary.    Pt enjoys ***.   Pt oriented to exercise equipment and routine.    Understanding  verbalized.    {[facility] MEDICAL DIRECTORS:[phone]}

## 2023-01-29 ENCOUNTER — Encounter (HOSPITAL_COMMUNITY)
Admission: RE | Admit: 2023-01-29 | Discharge: 2023-01-29 | Disposition: A | Discharge status: Home / Self Care | Payer: Medicare Other | Source: Ambulatory Visit | Attending: Interventional Cardiology | Admitting: Interventional Cardiology

## 2023-01-29 DIAGNOSIS — Z951 Presence of aortocoronary bypass graft: Secondary | ICD-10-CM | POA: Diagnosis not present

## 2023-01-31 ENCOUNTER — Encounter (HOSPITAL_COMMUNITY)
Admission: RE | Admit: 2023-01-31 | Discharge: 2023-01-31 | Disposition: A | Discharge status: Home / Self Care | Payer: Medicare Other | Source: Ambulatory Visit | Attending: Interventional Cardiology

## 2023-01-31 DIAGNOSIS — Z951 Presence of aortocoronary bypass graft: Secondary | ICD-10-CM | POA: Diagnosis not present

## 2023-02-03 ENCOUNTER — Encounter (HOSPITAL_COMMUNITY)
Admission: RE | Admit: 2023-02-03 | Discharge: 2023-02-03 | Disposition: A | Discharge status: Home / Self Care | Payer: Medicare Other | Source: Ambulatory Visit | Attending: Interventional Cardiology | Admitting: Interventional Cardiology

## 2023-02-03 DIAGNOSIS — Z951 Presence of aortocoronary bypass graft: Secondary | ICD-10-CM | POA: Diagnosis not present

## 2023-02-05 ENCOUNTER — Encounter (HOSPITAL_COMMUNITY)
Admission: RE | Admit: 2023-02-05 | Discharge: 2023-02-05 | Disposition: A | Discharge status: Home / Self Care | Payer: Medicare Other | Source: Ambulatory Visit | Attending: Interventional Cardiology | Admitting: Interventional Cardiology

## 2023-02-05 DIAGNOSIS — Z951 Presence of aortocoronary bypass graft: Secondary | ICD-10-CM | POA: Diagnosis not present

## 2023-02-07 ENCOUNTER — Encounter (HOSPITAL_COMMUNITY)
Admission: RE | Admit: 2023-02-07 | Discharge: 2023-02-07 | Disposition: A | Discharge status: Home / Self Care | Payer: Medicare Other | Source: Ambulatory Visit | Attending: Interventional Cardiology

## 2023-02-07 DIAGNOSIS — Z951 Presence of aortocoronary bypass graft: Secondary | ICD-10-CM | POA: Diagnosis not present

## 2023-02-10 ENCOUNTER — Encounter (HOSPITAL_COMMUNITY)
Admission: RE | Admit: 2023-02-10 | Discharge: 2023-02-10 | Disposition: A | Discharge status: Home / Self Care | Payer: Medicare Other | Source: Ambulatory Visit | Attending: Interventional Cardiology | Admitting: Interventional Cardiology

## 2023-02-10 DIAGNOSIS — Z951 Presence of aortocoronary bypass graft: Secondary | ICD-10-CM | POA: Diagnosis not present

## 2023-02-11 NOTE — Progress Notes (Signed)
Cardiac Individual Treatment Plan  Patient Details  Name: Erik [name] MRN: [id number] Date of Birth: 08/12/1945 Referring Provider:   Flowsheet Row INTENSIVE CARDIAC REHAB ORIENT from 01/21/2023 in [facility]  Referring Provider Lance Muss, MD       Initial Encounter Date:  Flowsheet Row INTENSIVE CARDIAC REHAB ORIENT from 01/21/2023 in [facility], [facility]  Date 01/21/23       Visit Diagnosis: 12/11/22 S/P CABG x 4  Patient's Home Medications on Admission:  Current Outpatient Medications:    amiodarone (PACERONE) 200 MG tablet, Take 200 mg by mouth daily., Disp: , Rfl:    amiodarone (PACERONE) 200 MG tablet, Take  200 mg bid for one week;then take 200 mg daily thereafter (Patient not taking: Reported on 01/21/2023), Disp: 90 tablet, Rfl: 2   apixaban (ELIQUIS) 5 MG TABS tablet, Take 1 tablet (5 mg total) by mouth 2 (two) times daily., Disp: 180 tablet, Rfl: 2   Ascorbic Acid (VITAMIN C) 500 MG CAPS, Take 500 mg by mouth every evening. Chewable, Disp: , Rfl:    aspirin EC 81 MG tablet, Take 1 tablet (81 mg total) by mouth daily. Swallow whole., Disp: , Rfl:    atorvastatin (LIPITOR) 40 MG tablet, Take 1 tablet (40 mg total) by mouth daily., Disp: 90 tablet, Rfl: 2   b complex vitamins capsule, Take 1 capsule by mouth daily., Disp: , Rfl:    Calcium Carb-Cholecalciferol (CALCIUM 600 + D PO), Take 1 tablet by mouth every evening., Disp: , Rfl:    Cholecalciferol (VITAMIN D3) 5000 units CAPS, Take 5,000 Units by mouth every morning., Disp: , Rfl:    colchicine 0.6 MG tablet, Take 0.6 mg by mouth 2 (two) times daily as needed (gout)., Disp: , Rfl:    ferrous sulfate 325 (65 FE) MG tablet, Take 325 mg by mouth every evening., Disp: , Rfl:    fexofenadine (ALLEGRA) 180 MG tablet, Take 180 mg by mouth daily., Disp: , Rfl:    Flaxseed, Linseed, (FLAXSEED OIL) 1000 MG  CAPS, Take 1 capsule by mouth daily., Disp: , Rfl:    folic acid (FOLVITE) 800 MCG tablet, Take 800 mcg by mouth daily., Disp: , Rfl:    furosemide (LASIX) 20 MG tablet, Take 1 tablet (20 mg total) by mouth daily as needed., Disp: 20 tablet, Rfl: 0   HYDROcodone-acetaminophen (NORCO) 10-325 MG tablet, Take 4 tablets by mouth every 4 (four) hours as needed for moderate pain (pain score 4-6) or severe pain (pain score 7-10)., Disp: , Rfl:    Melatonin 10 MG CAPS, Take 10 mg by mouth at bedtime., Disp: , Rfl:    methocarbamol (ROBAXIN) 500 MG tablet, Take 500 mg by mouth daily as needed for muscle spasms., Disp: , Rfl:    metoprolol tartrate (LOPRESSOR) 25 MG tablet, TAKE 1 TABLET BY MOUTH TWICE A DAY, Disp: 180 tablet, Rfl: 3   mirtazapine (REMERON) 30 MG tablet, Take 30 mg by mouth at bedtime., Disp: , Rfl:    Multiple Vitamin (MULITIVITAMIN WITH MINERALS) TABS, Take 1 tablet by mouth daily. Men, Disp: , Rfl:    OVER THE COUNTER MEDICATION, Take 1 tablet by mouth daily. Unforgettable memory supplement, Disp: , Rfl:    pantoprazole (PROTONIX) 40 MG tablet, Take 1 tablet (40 mg total) by mouth at bedtime., Disp: , Rfl:    potassium chloride SA (KLOR-CON M) 20 MEQ tablet, Take 1  tablet (20 mEq total) by mouth daily. For 10 days then stop (Patient not taking: Reported on 01/21/2023), Disp: 10 tablet, Rfl: 0   Saw Palmetto 450 MG CAPS, Take 1 capsule by mouth daily., Disp: , Rfl:    Selenium 200 MCG CAPS, Take 200 mcg by mouth every morning., Disp: , Rfl:    vitamin B-12 (CYANOCOBALAMIN) 500 MCG tablet, Take 500 mcg by mouth daily., Disp: , Rfl:    vitamin E 180 MG (400 UNITS) capsule, Take 400 Units by mouth daily., Disp: , Rfl:   Past Medical History: Past Medical History:  Diagnosis Date   Arthritis    Coronary artery disease    GERD (gastroesophageal reflux disease)    History of echocardiogram    Echo 4/17 - mild concentric LVH, EF 55-60%, no RWMA, Gr 1 DD, mild AI, mild MR, mild LAE    Hypertension    Kidney stone    Obesity    Tinea corporis     Tobacco Use: Social History   Tobacco Use  Smoking Status Never  Smokeless Tobacco Never    Labs: Review Flowsheet       Latest Ref Rng & Units 07/05/2009 04/22/2019 12/09/2022 12/11/2022  Labs for ITP Cardiac and Pulmonary Rehab  Cholestrol 100 - 199 mg/dL 332  951  - -  LDL (calc) 0 - 99 mg/dL 884  74  - -  HDL-C >16 mg/dL 60.63  87  - -  Trlycerides 0 - 149 mg/dL 01.6  70  - -  Hemoglobin A1c 4.8 - 5.6 % - - 4.9  -  PH, Arterial 7.35 - 7.45 - - 7.43  7.386  7.371  7.440  7.442  7.449  7.464  7.373   PCO2 arterial 32 - 48 mmHg - - 38  34.4  33.7  33.0  33.7  35.4  33.0  40.0   Bicarbonate 20.0 - 28.0 mmol/L - - 25.2  20.4  19.4  22.7  23.0  24.6  24.9  23.7  23.3   TCO2 22 - 32 mmol/L - - - 21  20  24  24  25  28  26  27  26  25  23  24  26    Acid-base deficit 0.0 - 2.0 mmol/L - - - 4.0  5.0  1.0  1.0  2.0   O2 Saturation % - - 99.2  98  99  99  100  100  83  100  100     Details       Multiple values from one day are sorted in reverse-chronological order         Capillary Blood Glucose: Lab Results  Component Value Date   GLUCAP 87 12/16/2022   GLUCAP 95 12/14/2022   GLUCAP 85 12/14/2022   GLUCAP 100 (H) 12/14/2022   GLUCAP 119 (H) 12/14/2022     Exercise Target Goals: Exercise Program Goal: Individual exercise prescription set using results from initial 6 min walk test and THRR while considering  patient's activity barriers and safety.   Exercise Prescription Goal: Initial exercise prescription builds to 30-45 minutes a day of aerobic activity, 2-3 days per week.  Home exercise guidelines will be given to patient during program as part of exercise prescription that the participant will acknowledge.  Activity Barriers & Risk Stratification:  Activity Barriers & Cardiac Risk Stratification - 01/21/23 1537       Activity Barriers & Cardiac Risk Stratification   Activity Barriers Balance  Concerns;Arthritis;Joint Problems;Back Problems;Deconditioning;Assistive Device;Left Knee Replacement;Right Knee Replacement;Neck/Spine Problems;Other (comment)    Comments Sternal Precautions    Cardiac Risk Stratification High             6 Minute Walk:  6 Minute Walk     Row Name 01/21/23 1450         6 Minute Walk   Phase Initial     Distance 932 feet     Walk Time 6 minutes     # of Rest Breaks 0     MPH 1.8     METS 1.5     RPE 13     Perceived Dyspnea  0     VO2 Peak 2.25     Symptoms Yes (comment)     Comments Chronic low back and rt hip pain 5/10     Resting HR 60 bpm     Resting BP 110/70     Resting Oxygen Saturation  98 %     Exercise Oxygen Saturation  during 6 min walk 100 %     Max Ex. HR 103 bpm     Max Ex. BP 130/78     2 Minute Post BP 122/70              Oxygen Initial Assessment:   Oxygen Re-Evaluation:   Oxygen Discharge (Final Oxygen Re-Evaluation):   Initial Exercise Prescription:  Initial Exercise Prescription - 01/21/23 1500       Date of Initial Exercise RX and Referring Provider   Date 01/21/23    Referring Provider Lance Muss, MD    Expected Discharge Date 04/16/23      Prescription Details   Frequency (times per week) 3    Duration Progress to 30 minutes of continuous aerobic without signs/symptoms of physical distress      Intensity   THRR 40-80% of Max Heartrate 57-114    Ratings of Perceived Exertion 11-13    Perceived Dyspnea 0-4      Progression   Progression Continue progressive overload as per policy without signs/symptoms or physical distress.      Resistance Training   Training Prescription Yes    Weight 3 lbs    Reps 10-15             Perform Capillary Blood Glucose checks as needed.  Exercise Prescription Changes:   Exercise Prescription Changes     Row Name 01/27/23 1600             Response to Exercise   Blood Pressure (Admit) 114/70       Blood Pressure (Exercise) 122/70        Blood Pressure (Exit) 110/70       Heart Rate (Admit) 60 bpm       Heart Rate (Exercise) 100 bpm       Heart Rate (Exit) 69 bpm       Rating of Perceived Exertion (Exercise) 12.5       Perceived Dyspnea (Exercise) 0       Symptoms 0       Comments Pt first day in the [facility] ICR program       Duration Progress to 30 minutes of  aerobic without signs/symptoms of physical distress       Intensity THRR unchanged         Progression   Progression Continue to progress workloads to maintain intensity without signs/symptoms of physical distress.       Average METs 1.98  Resistance Training   Training Prescription Yes       Weight 3 lbs       Reps 10-15       Time 10 Minutes         Recumbant Bike   Level 1       RPM 49       Watts 11       Minutes 15       METs 1.8         Track   Laps 9       Minutes 15       METs 2.15                Exercise Comments:   Exercise Comments     Row Name 01/27/23 1649           Exercise Comments Pt first day in the [facility] ICR program. Pt tolerated exercise well with an average MET level of 1.98. Pt is learning his THRR, RPE and ExRx. Will continue to monitor pt and progress workloads as tolerated without sign or symptom                Exercise Goals and Review:   Exercise Goals     Row Name 01/21/23 1537             Exercise Goals   Increase Physical Activity Yes       Intervention Provide advice, education, support and counseling about physical activity/exercise needs.;Develop an individualized exercise prescription for aerobic and resistive training based on initial evaluation findings, risk stratification, comorbidities and participant's personal goals.       Expected Outcomes Short Term: Attend rehab on a regular basis to increase amount of physical activity.;Long Term: Exercising regularly at least 3-5 days a week.;Long Term: Add in home exercise to make exercise part of routine and to increase amount  of physical activity.       Increase Strength and Stamina Yes       Intervention Provide advice, education, support and counseling about physical activity/exercise needs.;Develop an individualized exercise prescription for aerobic and resistive training based on initial evaluation findings, risk stratification, comorbidities and participant's personal goals.       Expected Outcomes Short Term: Increase workloads from initial exercise prescription for resistance, speed, and METs.;Short Term: Perform resistance training exercises routinely during rehab and add in resistance training at home;Long Term: Improve cardiorespiratory fitness, muscular endurance and strength as measured by increased METs and functional capacity ( )       Able to understand and use rate of perceived exertion (RPE) scale Yes       Intervention Provide education and explanation on how to use RPE scale       Expected Outcomes Short Term: Able to use RPE daily in rehab to express subjective intensity level;Long Term:  Able to use RPE to guide intensity level when exercising independently       Knowledge and understanding of Target Heart Rate Range (THRR) Yes       Intervention Provide education and explanation of THRR including how the numbers were predicted and where they are located for reference       Expected Outcomes Short Term: Able to state/look up THRR;Long Term: Able to use THRR to govern intensity when exercising independently;Short Term: Able to use daily as guideline for intensity in rehab       Understanding of Exercise Prescription Yes       Intervention Provide education,  explanation, and written materials on patient's individual exercise prescription       Expected Outcomes Short Term: Able to explain program exercise prescription;Long Term: Able to explain home exercise prescription to exercise independently                Exercise Goals Re-Evaluation :  Exercise Goals Re-Evaluation     Row Name 01/27/23  1648             Exercise Goal Re-Evaluation   Exercise Goals Review Increase Physical Activity;Understanding of Exercise Prescription;Increase Strength and Stamina;Knowledge and understanding of Target Heart Rate Range (THRR);Able to understand and use rate of perceived exertion (RPE) scale       Comments Pt first day in the [facility] ICR program. Pt tolerated exercise well with an average MET level of 1.98. Pt is learning his THRR, RPE and ExRx.       Expected Outcomes Will continue to monitor pt and progress workloads as tolerated without sign or symptom                Discharge Exercise Prescription (Final Exercise Prescription Changes):  Exercise Prescription Changes - 01/27/23 1600       Response to Exercise   Blood Pressure (Admit) 114/70    Blood Pressure (Exercise) 122/70    Blood Pressure (Exit) 110/70    Heart Rate (Admit) 60 bpm    Heart Rate (Exercise) 100 bpm    Heart Rate (Exit) 69 bpm    Rating of Perceived Exertion (Exercise) 12.5    Perceived Dyspnea (Exercise) 0    Symptoms 0    Comments Pt first day in the [facility] ICR program    Duration Progress to 30 minutes of  aerobic without signs/symptoms of physical distress    Intensity THRR unchanged      Progression   Progression Continue to progress workloads to maintain intensity without signs/symptoms of physical distress.    Average METs 1.98      Resistance Training   Training Prescription Yes    Weight 3 lbs    Reps 10-15    Time 10 Minutes      Recumbant Bike   Level 1    RPM 49    Watts 11    Minutes 15    METs 1.8      Track   Laps 9    Minutes 15    METs 2.15             Nutrition:  Target Goals: Understanding of nutrition guidelines, daily intake of sodium 1500mg , cholesterol 200mg , calories 30% from fat and 7% or less from saturated fats, daily to have 5 or more servings of fruits and vegetables.  Biometrics:  Pre Biometrics - 01/21/23 1400       Pre Biometrics   Waist  Circumference 47 inches    Hip Circumference 48.5 inches    Waist to Hip Ratio 0.97 %    Triceps Skinfold 27 mm    % Body Fat 35.8 %    Grip Strength 26 kg    Flexibility --   Not performed, significant back problems   Single Leg Stand 1.5 seconds              Nutrition Therapy Plan and Nutrition Goals:  Nutrition Therapy & Goals - 01/28/23 0947       Nutrition Therapy   Diet Heart Healthy Diet    Drug/Food Interactions Statins/Certain Fruits      Personal Nutrition Goals  Nutrition Goal Patient to identify strategies for reducing cardiovascular risk by attending the [facility] education and nutrition series weekly.    Personal Goal #2 Patient to improve diet quality by using the plate method as a guide for meal planning to include lean protein/plant protein, fruits, vegetables, whole grains, nonfat dairy as part of a well-balanced diet.    Personal Goal #3 Patient to reduce sodium intake to 1500mg  per day    Comments Erik [name] has medical history of HTN, CAD, CABGx4, Hyperlipidemia, PAF, DOE. His LDL remains above goal (81). Patient will benefit from participation in intensive cardiac rehab for nutrition, exercise, and lifestyle modification.      Intervention Plan   Intervention Prescribe, educate and counsel regarding individualized specific dietary modifications aiming towards targeted core components such as weight, hypertension, lipid management, diabetes, heart failure and other comorbidities.;Nutrition handout(s) given to patient.    Expected Outcomes Short Term Goal: Understand basic principles of dietary content, such as calories, fat, sodium, cholesterol and nutrients.;Long Term Goal: Adherence to prescribed nutrition plan.             Nutrition Assessments:  Nutrition Assessments - 01/28/23 0941       Rate Your Plate Scores   Pre Score 54            MEDIFICTS Score Key: >=70 Need to make dietary changes  40-70 Heart Healthy Diet <= 40 Therapeutic  Level Cholesterol Diet   Flowsheet Row INTENSIVE CARDIAC REHAB from 01/27/2023 in [facility]  Picture Your Plate Total Score on Admission 54      Picture Your Plate Scores: <16 Unhealthy dietary pattern with much room for improvement. 41-50 Dietary pattern unlikely to meet recommendations for good health and room for improvement. 51-60 More healthful dietary pattern, with some room for improvement.  >60 Healthy dietary pattern, although there may be some specific behaviors that could be improved.    Nutrition Goals Re-Evaluation:  Nutrition Goals Re-Evaluation     Row Name 01/28/23 0947             Goals   Current Weight 220 lb 0.3 oz (99.8 kg)       Comment A1c WNL, LDL 81       Expected Outcome Erik [name] has medical history of HTN, CAD, CABGx4, Hyperlipidemia, PAF, DOE. His LDL remains above goal (81). Patient will benefit from participation in intensive cardiac rehab for nutrition, exercise, and lifestyle modification.                Nutrition Goals Re-Evaluation:  Nutrition Goals Re-Evaluation     Row Name 01/28/23 0947             Goals   Current Weight 220 lb 0.3 oz (99.8 kg)       Comment A1c WNL, LDL 81       Expected Outcome Erik [name] has medical history of HTN, CAD, CABGx4, Hyperlipidemia, PAF, DOE. His LDL remains above goal (81). Patient will benefit from participation in intensive cardiac rehab for nutrition, exercise, and lifestyle modification.                Nutrition Goals Discharge (Final Nutrition Goals Re-Evaluation):  Nutrition Goals Re-Evaluation - 01/28/23 0947       Goals   Current Weight 220 lb 0.3 oz (99.8 kg)    Comment A1c WNL, LDL 81    Expected Outcome Erik [name] has medical history of HTN, CAD, CABGx4, Hyperlipidemia, PAF, DOE. His  LDL remains above goal (81). Patient will benefit from participation in intensive cardiac rehab for nutrition, exercise, and lifestyle modification.              Psychosocial: Target Goals: Acknowledge presence or absence of significant depression and/or stress, maximize coping skills, provide positive support system. Participant is able to verbalize types and ability to use techniques and skills needed for reducing stress and depression.  Initial Review & Psychosocial Screening:  Initial Psych Review & Screening - 01/21/23 1543       Initial Review   Current issues with Current Sleep Concerns      Family Dynamics   Good Support System? Yes   Pt has spouse, church friends, neighbors     Barriers   Psychosocial barriers to participate in program There are no identifiable barriers or psychosocial needs.      Screening Interventions   Interventions Encouraged to exercise             Quality of Life Scores:  Quality of Life - 01/21/23 1544       Quality of Life   Select Quality of Life      Quality of Life Scores   Health/Function Pre 26.77 %    Socioeconomic Pre 28 %    Psych/Spiritual Pre 30 %    Family Pre 30 %    GLOBAL Pre 28.26 %            Scores of 19 and below usually indicate a poorer quality of life in these areas.  A difference of  2-3 points is a clinically meaningful difference.  A difference of 2-3 points in the total score of the Quality of Life Index has been associated with significant improvement in overall quality of life, self-image, physical symptoms, and general health in studies assessing change in quality of life.  PHQ-9: Review Flowsheet       01/21/2023  Depression screen PHQ 2/9  Decreased Interest 0  Down, Depressed, Hopeless 0  PHQ - 2 Score 0  Altered sleeping 1  Tired, decreased energy 1  Change in appetite 0  Feeling bad or failure about yourself  0  Trouble concentrating 0  Moving slowly or fidgety/restless 0  Suicidal thoughts 0  PHQ-9 Score 2  Difficult doing work/chores Not difficult at all    Details           Interpretation of Total Score  Total Score  Depression Severity:  1-4 = Minimal depression, 5-9 = Mild depression, 10-14 = Moderate depression, 15-19 = Moderately severe depression, 20-27 = Severe depression   Psychosocial Evaluation and Intervention:   Psychosocial Re-Evaluation:  Psychosocial Re-Evaluation     Row Name 01/28/23 0803 02/11/23 1651           Psychosocial Re-Evaluation   Current issues with Current Sleep Concerns;Current Stress Concerns Current Sleep Concerns;Current Stress Concerns      Comments Erik [name] did voice some stress concerns regarding his chronic back pain. Erik [name] says that he is sleeping better. Erik [name] coninues to deal with chronic arthirtic back pain      Expected Outcomes -- Erik [name] will have decreased or controlled stress upon completion of cardiac rehab.      Interventions Encouraged to attend Cardiac Rehabilitation for the exercise;Relaxation education Encouraged to attend Cardiac Rehabilitation for the exercise;Relaxation education      Continue Psychosocial Services  Follow up required by staff No Follow up required        Initial Review   Source  of Stress Concerns Chronic Illness Chronic Illness      Comments Will continue to monitor and offer support as needed. Will continue to monitor and offer support as needed.               Psychosocial Discharge (Final Psychosocial Re-Evaluation):  Psychosocial Re-Evaluation - 02/11/23 1651       Psychosocial Re-Evaluation   Current issues with Current Sleep Concerns;Current Stress Concerns    Comments Erik [name] says that he is sleeping better. Erik [name] coninues to deal with chronic arthirtic back pain    Expected Outcomes Erik [name] will have decreased or controlled stress upon completion of cardiac rehab.    Interventions Encouraged to attend Cardiac Rehabilitation for the exercise;Relaxation education    Continue Psychosocial Services  No Follow up required      Initial Review   Source of Stress Concerns Chronic Illness    Comments Will continue to monitor and offer  support as needed.             Vocational Rehabilitation: Provide vocational rehab assistance to qualifying candidates.   Vocational Rehab Evaluation & Intervention:  Vocational Rehab - 01/21/23 1547       Initial Vocational Rehab Evaluation & Intervention   Assessment shows need for Vocational Rehabilitation No   Pt is retired            Education: Education Goals: Education classes will be provided on a weekly basis, covering required topics. Participant will state understanding/return demonstration of topics presented.    Education     Row Name 01/29/23 1500     Education   Cardiac Education Topics [facility]   Orthoptist   Educator Dietitian   Weekly Topic Powerhouse Plant-Based Proteins   Instruction Review Code 1- Verbalizes Understanding   Class Start Time 1400   Class Stop Time 1438   Class Time Calculation (min) 38 min    Row Name 01/31/23 1400     Education   Cardiac Education Topics [facility]   Hospital doctor Education   General Education Hypertension and Heart Disease   Instruction Review Code 1- Verbalizes Understanding   Class Start Time 1357   Class Stop Time 1430   Class Time Calculation (min) 33 min    Row Name 02/03/23 1700     Education   Cardiac Education Topics [facility]   Geographical information systems officer Psychosocial   Psychosocial Workshop From Head to Heart: The Power of a Healthy Outlook   Instruction Review Code 1- Verbalizes Understanding   Class Start Time 1400   Class Stop Time 1450   Class Time Calculation (min) 50 min    Row Name 02/05/23 1500     Education   Cardiac Education Topics [facility]   Customer service manager   Weekly Topic Adding Flavor - Sodium-Free   Instruction Review Code 1- Verbalizes Understanding   Class Start Time  1400   Class Stop Time 1440   Class Time Calculation (min) 40 min    Row Name 02/07/23 1500     Education   Cardiac Education Topics [facility]   Hospital doctor Education   General Education Heart Disease Risk  Reduction   Instruction Review Code 1- Verbalizes Understanding   Class Start Time 1400   Class Stop Time 1433   Class Time Calculation (min) 33 min    Row Name 02/10/23 1600     Education   Cardiac Education Topics [facility]   Select Workshops     Workshops   Educator Exercise Physiologist   Select Exercise   Exercise Workshop Location manager and Fall Prevention   Instruction Review Code 1- Verbalizes Understanding   Class Start Time 1406   Class Stop Time 1500   Class Time Calculation (min) 54 min            Core Videos: Exercise    Move It!  Clinical staff conducted group or individual video education with verbal and written material and guidebook.  Patient learns the recommended [facility] exercise program. Exercise with the goal of living a long, healthy life. Some of the health benefits of exercise include controlled diabetes, healthier blood pressure levels, improved cholesterol levels, improved heart and lung capacity, improved sleep, and better body composition. Everyone should speak with their doctor before starting or changing an exercise routine.  Biomechanical Limitations Clinical staff conducted group or individual video education with verbal and written material and guidebook.  Patient learns how biomechanical limitations can impact exercise and how we can mitigate and possibly overcome limitations to have an impactful and balanced exercise routine.  Body Composition Clinical staff conducted group or individual video education with verbal and written material and guidebook.  Patient learns that body composition (ratio of muscle mass to fat mass) is a key component to assessing  overall fitness, rather than body weight alone. Increased fat mass, especially visceral belly fat, can put [place] at increased risk for metabolic syndrome, type 2 diabetes, heart disease, and even death. It is recommended to combine diet and exercise (cardiovascular and resistance training) to improve your body composition. Seek guidance from your physician and exercise physiologist before implementing an exercise routine.  Exercise Action Plan Clinical staff conducted group or individual video education with verbal and written material and guidebook.  Patient learns the recommended strategies to achieve and enjoy long-term exercise adherence, including variety, self-motivation, self-efficacy, and positive decision making. Benefits of exercise include fitness, good health, weight management, more energy, better sleep, less stress, and overall well-being.  Medical   Heart Disease Risk Reduction Clinical staff conducted group or individual video education with verbal and written material and guidebook.  Patient learns our heart is our most vital organ as it circulates oxygen, nutrients, white blood cells, and hormones throughout the entire body, and carries waste away. Data supports a plant-based eating plan like the Pritikin Program for its effectiveness in slowing progression of and reversing heart disease. The video provides a number of recommendations to address heart disease.   Metabolic Syndrome and Belly Fat  Clinical staff conducted group or individual video education with verbal and written material and guidebook.  Patient learns what metabolic syndrome is, how it leads to heart disease, and how one can reverse it and keep it from coming back. You have metabolic syndrome if you have 3 of the following 5 criteria: abdominal obesity, high blood pressure, high triglycerides, low HDL cholesterol, and high blood sugar.  Hypertension and Heart Disease Clinical staff conducted group or individual video  education with verbal and written material and guidebook.  Patient learns that high blood pressure, or hypertension, is very common in the [place]. Hypertension is largely due to excessive salt intake,  but other important risk factors include being overweight, physical inactivity, drinking too much alcohol, smoking, and not eating enough potassium from fruits and vegetables. High blood pressure is a leading risk factor for heart attack, stroke, congestive heart failure, dementia, kidney failure, and premature death. Long-term effects of excessive salt intake include stiffening of the arteries and thickening of heart muscle and organ damage. Recommendations include ways to reduce hypertension and the risk of heart disease.  Diseases of Our Time - Focusing on Diabetes Clinical staff conducted group or individual video education with verbal and written material and guidebook.  Patient learns why the best way to stop diseases of our time is prevention, through food and other lifestyle changes. Medicine (such as prescription pills and surgeries) is often only a Band-Aid on the problem, not a long-term solution. Most common diseases of our time include obesity, type 2 diabetes, hypertension, heart disease, and cancer. The Pritikin Program is recommended and has been proven to help reduce, reverse, and/or prevent the damaging effects of metabolic syndrome.  Nutrition   Overview of the Pritikin Eating Plan  Clinical staff conducted group or individual video education with verbal and written material and guidebook.  Patient learns about the Pritikin Eating Plan for disease risk reduction. The Pritikin Eating Plan emphasizes a wide variety of unrefined, minimally-processed carbohydrates, like fruits, vegetables, whole grains, and legumes. Go, Caution, and Stop food choices are explained. Plant-based and lean animal proteins are emphasized. Rationale provided for low sodium intake for blood pressure control,  low added sugars for blood sugar stabilization, and low added fats and oils for coronary artery disease risk reduction and weight management.  Calorie Density  Clinical staff conducted group or individual video education with verbal and written material and guidebook.  Patient learns about calorie density and how it impacts the Pritikin Eating Plan. Knowing the characteristics of the food you choose will help you decide whether those foods will lead to weight gain or weight loss, and whether you want to consume more or less of them. Weight loss is usually a side effect of the Pritikin Eating Plan because of its focus on low calorie-dense foods.  Label Reading  Clinical staff conducted group or individual video education with verbal and written material and guidebook.  Patient learns about the Pritikin recommended label reading guidelines and corresponding recommendations regarding calorie density, added sugars, sodium content, and whole grains.  Dining Out - Part 1  Clinical staff conducted group or individual video education with verbal and written material and guidebook.  Patient learns that restaurant meals can be sabotaging because they can be so high in calories, fat, sodium, and/or sugar. Patient learns recommended strategies on how to positively address this and avoid unhealthy pitfalls.  Facts on Fats  Clinical staff conducted group or individual video education with verbal and written material and guidebook.  Patient learns that lifestyle modifications can be just as effective, if not more so, as many medications for lowering your risk of heart disease. A Pritikin lifestyle can help to reduce your risk of inflammation and atherosclerosis (cholesterol build-up, or plaque, in the artery walls). Lifestyle interventions such as dietary choices and physical activity address the cause of atherosclerosis. A review of the types of fats and their impact on blood cholesterol levels, along with dietary  recommendations to reduce fat intake is also included.  Nutrition Action Plan  Clinical staff conducted group or individual video education with verbal and written material and guidebook.  Patient learns how to incorporate  Pritikin recommendations into their lifestyle. Recommendations include planning and keeping personal health goals in mind as an important part of their success.  Healthy Mind-Set    Healthy Minds, Bodies, Hearts  Clinical staff conducted group or individual video education with verbal and written material and guidebook.  Patient learns how to identify when they are stressed. Video will discuss the impact of that stress, as well as the many benefits of stress management. Patient will also be introduced to stress management techniques. The way we think, act, and feel has an impact on our hearts.  How Our Thoughts Can Heal Our Hearts  Clinical staff conducted group or individual video education with verbal and written material and guidebook.  Patient learns that negative thoughts can cause depression and anxiety. This can result in negative lifestyle behavior and serious health problems. Cognitive behavioral therapy is an effective method to help control our thoughts in order to change and improve our emotional outlook.  Additional Videos:  Exercise    Improving Performance  Clinical staff conducted group or individual video education with verbal and written material and guidebook.  Patient learns to use a non-linear approach by alternating intensity levels and lengths of time spent exercising to help burn more calories and lose more body fat. Cardiovascular exercise helps improve heart health, metabolism, hormonal balance, blood sugar control, and recovery from fatigue. Resistance training improves strength, endurance, balance, coordination, reaction time, metabolism, and muscle mass. Flexibility exercise improves circulation, posture, and balance. Seek guidance from your  physician and exercise physiologist before implementing an exercise routine and learn your capabilities and proper form for all exercise.  Introduction to Yoga  Clinical staff conducted group or individual video education with verbal and written material and guidebook.  Patient learns about yoga, a discipline of the coming together of mind, breath, and body. The benefits of yoga include improved flexibility, improved range of motion, better posture and core strength, increased lung function, weight loss, and positive self-image. Yoga's heart health benefits include lowered blood pressure, healthier heart rate, decreased cholesterol and triglyceride levels, improved immune function, and reduced stress. Seek guidance from your physician and exercise physiologist before implementing an exercise routine and learn your capabilities and proper form for all exercise.  Medical   Aging: Enhancing Your Quality of Life  Clinical staff conducted group or individual video education with verbal and written material and guidebook.  Patient learns key strategies and recommendations to stay in good physical health and enhance quality of life, such as prevention strategies, having an advocate, securing a Health Care Proxy and Power of Attorney, and keeping a list of medications and system for tracking them. It also discusses how to avoid risk for bone loss.  Biology of Weight Control  Clinical staff conducted group or individual video education with verbal and written material and guidebook.  Patient learns that weight gain occurs because we consume more calories than we burn (eating more, moving less). Even if your body weight is normal, you may have higher ratios of fat compared to muscle mass. Too much body fat puts you at increased risk for cardiovascular disease, heart attack, stroke, type 2 diabetes, and obesity-related cancers. In addition to exercise, following the Pritikin Eating Plan can help reduce your  risk.  Decoding Lab Results  Clinical staff conducted group or individual video education with verbal and written material and guidebook.  Patient learns that lab test reflects one measurement whose values change over time and are influenced by many factors, including medication,  stress, sleep, exercise, food, hydration, pre-existing medical conditions, and more. It is recommended to use the knowledge from this video to become more involved with your lab results and evaluate your numbers to speak with your doctor.   Diseases of Our Time - Overview  Clinical staff conducted group or individual video education with verbal and written material and guidebook.  Patient learns that according to the CDC, 50% to 70% of chronic diseases (such as obesity, type 2 diabetes, elevated lipids, hypertension, and heart disease) are avoidable through lifestyle improvements including healthier food choices, listening to satiety cues, and increased physical activity.  Sleep Disorders Clinical staff conducted group or individual video education with verbal and written material and guidebook.  Patient learns how good quality and duration of sleep are important to overall health and well-being. Patient also learns about sleep disorders and how they impact health along with recommendations to address them, including discussing with a physician.  Nutrition  Dining Out - Part 2 Clinical staff conducted group or individual video education with verbal and written material and guidebook.  Patient learns how to plan ahead and communicate in order to maximize their dining experience in a healthy and nutritious manner. Included are recommended food choices based on the type of restaurant the patient is visiting.   Fueling a Banker conducted group or individual video education with verbal and written material and guidebook.  There is a strong connection between our food choices and our health. Diseases  like obesity and type 2 diabetes are very prevalent and are in large-part due to lifestyle choices. The Pritikin Eating Plan provides plenty of food and hunger-curbing satisfaction. It is easy to follow, affordable, and helps reduce health risks.  Menu Workshop  Clinical staff conducted group or individual video education with verbal and written material and guidebook.  Patient learns that restaurant meals can sabotage health goals because they are often packed with calories, fat, sodium, and sugar. Recommendations include strategies to plan ahead and to communicate with the manager, chef, or server to help order a healthier meal.  Planning Your Eating Strategy  Clinical staff conducted group or individual video education with verbal and written material and guidebook.  Patient learns about the Pritikin Eating Plan and its benefit of reducing the risk of disease. The Pritikin Eating Plan does not focus on calories. Instead, it emphasizes high-quality, nutrient-rich foods. By knowing the characteristics of the foods, we choose, we can determine their calorie density and make informed decisions.  Targeting Your Nutrition Priorities  Clinical staff conducted group or individual video education with verbal and written material and guidebook.  Patient learns that lifestyle habits have a tremendous impact on disease risk and progression. This video provides eating and physical activity recommendations based on your personal health goals, such as reducing LDL cholesterol, losing weight, preventing or controlling type 2 diabetes, and reducing high blood pressure.  Vitamins and Minerals  Clinical staff conducted group or individual video education with verbal and written material and guidebook.  Patient learns different ways to obtain key vitamins and minerals, including through a recommended healthy diet. It is important to discuss all supplements you take with your doctor.   Healthy Mind-Set    Smoking  Cessation  Clinical staff conducted group or individual video education with verbal and written material and guidebook.  Patient learns that cigarette smoking and tobacco addiction pose a serious health risk which affects millions of people. Stopping smoking will significantly reduce the risk of  heart disease, lung disease, and many forms of cancer. Recommended strategies for quitting are covered, including working with your doctor to develop a successful plan.  Culinary   Becoming a Set designer conducted group or individual video education with verbal and written material and guidebook.  Patient learns that cooking at home can be healthy, cost-effective, quick, and puts them in control. Keys to cooking healthy recipes will include looking at your recipe, assessing your equipment needs, planning ahead, making it simple, choosing cost-effective seasonal ingredients, and limiting the use of added fats, salts, and sugars.  Cooking - Breakfast and Snacks  Clinical staff conducted group or individual video education with verbal and written material and guidebook.  Patient learns how important breakfast is to satiety and nutrition through the entire day. Recommendations include key foods to eat during breakfast to help stabilize blood sugar levels and to prevent overeating at meals later in the day. Planning ahead is also a key component.  Cooking - Educational psychologist conducted group or individual video education with verbal and written material and guidebook.  Patient learns eating strategies to improve overall health, including an approach to cook more at home. Recommendations include thinking of animal protein as a side on your plate rather than center stage and focusing instead on lower calorie dense options like vegetables, fruits, whole grains, and plant-based proteins, such as beans. Making sauces in large quantities to freeze for later and leaving the skin on your  vegetables are also recommended to maximize your experience.  Cooking - Healthy Salads and Dressing Clinical staff conducted group or individual video education with verbal and written material and guidebook.  Patient learns that vegetables, fruits, whole grains, and legumes are the foundations of the Pritikin Eating Plan. Recommendations include how to incorporate each of these in flavorful and healthy salads, and how to create homemade salad dressings. Proper handling of ingredients is also covered. Cooking - Soups and [employer] - Soups and Desserts Clinical staff conducted group or individual video education with verbal and written material and guidebook.  Patient learns that Pritikin soups and desserts make for easy, nutritious, and delicious snacks and meal components that are low in sodium, fat, sugar, and calorie density, while high in vitamins, minerals, and filling fiber. Recommendations include simple and healthy ideas for soups and desserts.   Overview     The Pritikin Solution Program Overview Clinical staff conducted group or individual video education with verbal and written material and guidebook.  Patient learns that the results of the Pritikin Program have been documented in more than 100 articles published in peer-reviewed journals, and the benefits include reducing risk factors for (and, in some cases, even reversing) high cholesterol, high blood pressure, type 2 diabetes, obesity, and more! An overview of the three key pillars of the Pritikin Program will be covered: eating well, doing regular exercise, and having a healthy mind-set.  WORKSHOPS  Exercise: Exercise Basics: Building Your Action Plan Clinical staff led group instruction and group discussion with PowerPoint presentation and patient guidebook. To enhance the learning environment the use of posters, models and videos may be added. At the conclusion of this workshop, patients will comprehend the  difference between physical activity and exercise, as well as the benefits of incorporating both, into their routine. Patients will understand the FITT (Frequency, Intensity, Time, and Type) principle and how to use it to build an exercise action plan. In addition, safety concerns and other considerations for  exercise and cardiac rehab will be addressed by the presenter. The purpose of this lesson is to promote a comprehensive and effective weekly exercise routine in order to improve patients' overall level of fitness.   Managing Heart Disease: Your Path to a Healthier Heart Clinical staff led group instruction and group discussion with PowerPoint presentation and patient guidebook. To enhance the learning environment the use of posters, models and videos may be added.At the conclusion of this workshop, patients will understand the anatomy and physiology of the heart. Additionally, they will understand how Pritikin's three pillars impact the risk factors, the progression, and the management of heart disease.  The purpose of this lesson is to provide a high-level overview of the heart, heart disease, and how the Pritikin lifestyle positively impacts risk factors.  Exercise Biomechanics Clinical staff led group instruction and group discussion with PowerPoint presentation and patient guidebook. To enhance the learning environment the use of posters, models and videos may be added. Patients will learn how the structural parts of their bodies function and how these functions impact their daily activities, movement, and exercise. Patients will learn how to promote a neutral spine, learn how to manage pain, and identify ways to improve their physical movement in order to promote healthy living. The purpose of this lesson is to expose patients to common physical limitations that impact physical activity. Participants will learn practical ways to adapt and manage aches and pains, and to minimize their  effect on regular exercise. Patients will learn how to maintain good posture while sitting, walking, and lifting.  Balance Training and Fall Prevention  Clinical staff led group instruction and group discussion with PowerPoint presentation and patient guidebook. To enhance the learning environment the use of posters, models and videos may be added. At the conclusion of this workshop, patients will understand the importance of their sensorimotor skills (vision, proprioception, and the vestibular system) in maintaining their ability to balance as they age. Patients will apply a variety of balancing exercises that are appropriate for their current level of function. Patients will understand the common causes for poor balance, possible solutions to these problems, and ways to modify their physical environment in order to minimize their fall risk. The purpose of this lesson is to teach patients about the importance of maintaining balance as they age and ways to minimize their risk of falling.  WORKSHOPS   Nutrition:  Fueling a Ship broker led group instruction and group discussion with PowerPoint presentation and patient guidebook. To enhance the learning environment the use of posters, models and videos may be added. Patients will review the foundational principles of the Pritikin Eating Plan and understand what constitutes a serving size in each of the food groups. Patients will also learn Pritikin-friendly foods that are better choices when away from home and review make-ahead meal and snack options. Calorie density will be reviewed and applied to three nutrition priorities: weight maintenance, weight loss, and weight gain. The purpose of this lesson is to reinforce (in a group setting) the key concepts around what patients are recommended to eat and how to apply these guidelines when away from home by planning and selecting Pritikin-friendly options. Patients will understand how calorie  density may be adjusted for different weight management goals.  Mindful Eating  Clinical staff led group instruction and group discussion with PowerPoint presentation and patient guidebook. To enhance the learning environment the use of posters, models and videos may be added. Patients will briefly review the concepts of  the Pritikin Eating Plan and the importance of low-calorie dense foods. The concept of mindful eating will be introduced as well as the importance of paying attention to internal hunger signals. Triggers for non-hunger eating and techniques for dealing with triggers will be explored. The purpose of this lesson is to provide patients with the opportunity to review the basic principles of the Pritikin Eating Plan, discuss the value of eating mindfully and how to measure internal cues of hunger and fullness using the Hunger Scale. Patients will also discuss reasons for non-hunger eating and learn strategies to use for controlling emotional eating.  Targeting Your Nutrition Priorities Clinical staff led group instruction and group discussion with PowerPoint presentation and patient guidebook. To enhance the learning environment the use of posters, models and videos may be added. Patients will learn how to determine their genetic susceptibility to disease by reviewing their family history. Patients will gain insight into the importance of diet as part of an overall healthy lifestyle in mitigating the impact of genetics and other environmental insults. The purpose of this lesson is to provide patients with the opportunity to assess their personal nutrition priorities by looking at their family history, their own health history and current risk factors. Patients will also be able to discuss ways of prioritizing and modifying the Pritikin Eating Plan for their highest risk areas  Menu  Clinical staff led group instruction and group discussion with PowerPoint presentation and patient guidebook. To  enhance the learning environment the use of posters, models and videos may be added. Using menus brought in from [employer], or printed from [employer], patients will apply the Pritikin dining out guidelines that were presented in the [employer] video. Patients will also be able to practice these guidelines in a variety of provided scenarios. The purpose of this lesson is to provide patients with the opportunity to practice hands-on learning of the [facility] Dining Out guidelines with actual menus and practice scenarios.  Label Reading Clinical staff led group instruction and group discussion with PowerPoint presentation and patient guidebook. To enhance the learning environment the use of posters, models and videos may be added. Patients will review and discuss the Pritikin label reading guidelines presented in [employer]'s Label Reading Educational series video. Using fool labels brought in from local grocery stores and markets, patients will apply the label reading guidelines and determine if the packaged food meet the Pritikin guidelines. The purpose of this lesson is to provide patients with the opportunity to review, discuss, and practice hands-on learning of the P[facility]ikin Label Reading guidelines with actual packaged food labels. Cooking School  [employer]'s [employer] are designed to teach patients ways to prepare quick, simple, and affordable recipes at home. The importance of nutrition's role in chronic disease risk reduction is reflected in its emphasis in the overall [facility]in program. By learning how to prepare essential core Pritikin Eating Plan recipes, patients will increase control over what they eat; be able to customize the flavor of foods without the use of added salt, sugar, or fat; and improve the quality of the food they consume. By learning a set of core recipes which are easily assembled, quickly prepared, and affordable, patients are more likely to  prepare more healthy foods at home. These workshops focus on convenient breakfasts, simple entres, side dishes, and desserts which can be prepared with minimal effort and are consistent with nutrition recommendations for cardiovascular risk reduction. Cooking [employer] are taught by a Armed forces logistics/support/administrative officer (  RD) who has been trained by the [employer] team. The chef or RD has a clear understanding of the importance of minimizing - if not completely eliminating - added fat, sugar, and sodium in recipes. Throughout the series of Cooking School Workshop sessions, patients will learn about healthy ingredients and efficient methods of cooking to build confidence in their capability to prepare    Cooking School weekly topics:  Adding Flavor- Sodium-Free  Fast and Healthy Breakfasts  Powerhouse Plant-Based Proteins  Satisfying Salads and Dressings  Simple Sides and Sauces  International Cuisine-Spotlight on the [employer] Zones  Delicious Desserts  Savory Soups  [employer] - Meals in a Astronomer Appetizers and Snacks  Comforting Weekend Breakfasts  One-Pot Wonders   Fast Evening Meals  Landscape architect Your Pritikin Plate  WORKSHOPS   Healthy Mindset (Psychosocial):  Focused Goals, Sustainable Changes Clinical staff led group instruction and group discussion with PowerPoint presentation and patient guidebook. To enhance the learning environment the use of posters, models and videos may be added. Patients will be able to apply effective goal setting strategies to establish at least one personal goal, and then take consistent, meaningful action toward that goal. They will learn to identify common barriers to achieving personal goals and develop strategies to overcome them. Patients will also gain an understanding of how our mind-set can impact our ability to achieve goals and the importance of cultivating a positive and growth-oriented mind-set. The purpose  of this lesson is to provide patients with a deeper understanding of how to set and achieve personal goals, as well as the tools and strategies needed to overcome common obstacles which may arise along the way.  From Head to Heart: The Power of a Healthy Outlook  Clinical staff led group instruction and group discussion with PowerPoint presentation and patient guidebook. To enhance the learning environment the use of posters, models and videos may be added. Patients will be able to recognize and describe the impact of emotions and mood on physical health. They will discover the importance of self-care and explore self-care practices which may work for them. Patients will also learn how to utilize the 4 C's to cultivate a healthier outlook and better manage stress and challenges. The purpose of this lesson is to demonstrate to patients how a healthy outlook is an essential part of maintaining good health, especially as they continue their cardiac rehab journey.  Healthy Sleep for a Healthy Heart Clinical staff led group instruction and group discussion with PowerPoint presentation and patient guidebook. To enhance the learning environment the use of posters, models and videos may be added. At the conclusion of this workshop, patients will be able to demonstrate knowledge of the importance of sleep to overall health, well-being, and quality of life. They will understand the symptoms of, and treatments for, common sleep disorders. Patients will also be able to identify daytime and nighttime behaviors which impact sleep, and they will be able to apply these tools to help manage sleep-related challenges. The purpose of this lesson is to provide patients with a general overview of sleep and outline the importance of quality sleep. Patients will learn about a few of the most common sleep disorders. Patients will also be introduced to the concept of "sleep hygiene," and discover ways to self-manage certain sleeping  problems through simple daily behavior changes. Finally, the workshop will motivate patients by clarifying the links between quality sleep and their goals of heart-healthy living.   Recognizing and  Reducing Stress Clinical staff led group instruction and group discussion with PowerPoint presentation and patient guidebook. To enhance the learning environment the use of posters, models and videos may be added. At the conclusion of this workshop, patients will be able to understand the types of stress reactions, differentiate between acute and chronic stress, and recognize the impact that chronic stress has on their health. They will also be able to apply different coping mechanisms, such as reframing negative self-talk. Patients will have the opportunity to practice a variety of stress management techniques, such as deep abdominal breathing, progressive muscle relaxation, and/or guided imagery.  The purpose of this lesson is to educate patients on the role of stress in their lives and to provide healthy techniques for coping with it.  Learning Barriers/Preferences:  Learning Barriers/Preferences - 01/21/23 1545       Learning Barriers/Preferences   Learning Barriers Sight;Hearing   HOH, does not wear hearing aids. Reading glasses   Learning Preferences Audio;Computer/Internet;Group Instruction;Individual Instruction;Pictoral;Skilled Demonstration;Verbal Instruction;Video;Written Material             Education Topics:  Knowledge Questionnaire Score:  Knowledge Questionnaire Score - 01/21/23 1544       Knowledge Questionnaire Score   Pre Score 21/24             Core Components/Risk Factors/Patient Goals at Admission:  Personal Goals and Risk Factors at Admission - 01/21/23 1546       Core Components/Risk Factors/Patient Goals on Admission    Weight Management Yes;Obesity;Weight Loss    Intervention Weight Management: Develop a combined nutrition and exercise program designed to  reach desired caloric intake, while maintaining appropriate intake of nutrient and fiber, sodium and fats, and appropriate energy expenditure required for the weight goal.;Weight Management: Provide education and appropriate resources to help participant work on and attain dietary goals.;Weight Management/Obesity: Establish reasonable short term and long term weight goals.;Obesity: Provide education and appropriate resources to help participant work on and attain dietary goals.    Admit Weight 224 lb 6.9 oz (101.8 kg)    Goal Weight: Long Term 200 lb (90.7 kg)    Expected Outcomes Short Term: Continue to assess and modify interventions until short term weight is achieved;Long Term: Adherence to nutrition and physical activity/exercise program aimed toward attainment of established weight goal;Weight Loss: Understanding of general recommendations for a balanced deficit meal plan, which promotes 1-2 lb weight loss per week and includes a negative energy balance of [phone] kcal/d;Understanding recommendations for meals to include 15-35% energy as protein, 25-35% energy from fat, 35-60% energy from carbohydrates, less than 200mg  of dietary cholesterol, 20-35 gm of total fiber daily;Understanding of distribution of calorie intake throughout the day with the consumption of 4-5 meals/snacks    Hypertension Yes    Intervention Provide education on lifestyle modifcations including regular physical activity/exercise, weight management, moderate sodium restriction and increased consumption of fresh fruit, vegetables, and low fat dairy, alcohol moderation, and smoking cessation.;Monitor prescription use compliance.    Expected Outcomes Short Term: Continued assessment and intervention until BP is < 140/73mm HG in hypertensive participants. < 130/58mm HG in hypertensive participants with diabetes, heart failure or chronic kidney disease.;Long Term: Maintenance of blood pressure at goal levels.    Lipids Yes     Intervention Provide education and support for participant on nutrition & aerobic/resistive exercise along with prescribed medications to achieve LDL 70mg , HDL >40mg .    Expected Outcomes Short Term: Participant states understanding of desired cholesterol values and is compliant with medications  prescribed. Participant is following exercise prescription and nutrition guidelines.;Long Term: Cholesterol controlled with medications as prescribed, with individualized exercise RX and with personalized nutrition plan. Value goals: LDL < 70mg , HDL > 40 mg.             Core Components/Risk Factors/Patient Goals Review:   Goals and Risk Factor Review     Row Name 01/28/23 0807 02/11/23 1653           Core Components/Risk Factors/Patient Goals Review   Personal Goals Review Weight Management/Obesity;Hypertension;Lipids;Stress Weight Management/Obesity;Hypertension;Lipids;Stress      Review Erik [name] started cardiac rehab on 01/28/23. Erik [name] did well with exercise for his fitness level. Vital signs were stable. Erik [name] took rest breaks on the walking track and used the rollator as Erik [name] is somewhat deconditioned. Erik [name] started cardiac rehab on 01/28/23. Erik [name] did well with exercise for his fitness level. Vital signs were stable. Erik [name] has lost 3 kg since starting cardiac rehab.      Expected Outcomes Erik [name] will continue to participate in cardiac rehab for exercise, nutrition and lifestyle modifications Erik [name] will continue to participate in cardiac rehab for exercise, nutrition and lifestyle modifications               Core Components/Risk Factors/Patient Goals at Discharge (Final Review):   Goals and Risk Factor Review - 02/11/23 1653       Core Components/Risk Factors/Patient Goals Review   Personal Goals Review Weight Management/Obesity;Hypertension;Lipids;Stress    Review Erik [name] started cardiac rehab on 01/28/23. Erik [name] did well with exercise for his fitness level. Vital signs were stable. Erik [name] has lost 3 kg since  starting cardiac rehab.    Expected Outcomes Erik [name] will continue to participate in cardiac rehab for exercise, nutrition and lifestyle modifications             ITP Comments:  ITP Comments     Row Name 01/21/23 1321 01/28/23 0758 02/11/23 1650       ITP Comments Armanda Magic, MD:  Introduction to the [facility] Education Program/Intensive Cardiac Rehab. Initial orientation packet reviewed with the patient. 30 Day ITP Review. Erik [name] did well with exercise at cardiac rehab for his fitness level on 01/27/23 30 Day ITP Review. Erik [name]  started exercise at cardiac rehab on 01/27/23. Erik [name] is off to a good start to exercise.              Comments: See ITP comments.Thayer Headings RN BSN

## 2023-02-12 ENCOUNTER — Encounter (HOSPITAL_COMMUNITY)
Admission: RE | Admit: 2023-02-12 | Discharge: 2023-02-12 | Disposition: A | Discharge status: Home / Self Care | Payer: Medicare Other | Source: Ambulatory Visit | Attending: Interventional Cardiology | Admitting: Interventional Cardiology

## 2023-02-12 DIAGNOSIS — Z951 Presence of aortocoronary bypass graft: Secondary | ICD-10-CM

## 2023-02-14 ENCOUNTER — Encounter (HOSPITAL_COMMUNITY)
Admission: RE | Admit: 2023-02-14 | Discharge: 2023-02-14 | Disposition: A | Discharge status: Home / Self Care | Payer: Medicare Other | Source: Ambulatory Visit | Attending: Interventional Cardiology

## 2023-02-14 DIAGNOSIS — Z951 Presence of aortocoronary bypass graft: Secondary | ICD-10-CM | POA: Diagnosis not present

## 2023-02-17 ENCOUNTER — Encounter (HOSPITAL_COMMUNITY)
Admission: RE | Admit: 2023-02-17 | Discharge: 2023-02-17 | Disposition: A | Discharge status: Home / Self Care | Payer: Medicare Other | Source: Ambulatory Visit | Attending: Interventional Cardiology | Admitting: Interventional Cardiology

## 2023-02-17 DIAGNOSIS — Z951 Presence of aortocoronary bypass graft: Secondary | ICD-10-CM

## 2023-02-19 ENCOUNTER — Encounter (HOSPITAL_COMMUNITY)
Admission: RE | Admit: 2023-02-19 | Discharge: 2023-02-19 | Disposition: A | Discharge status: Home / Self Care | Payer: Medicare Other | Source: Ambulatory Visit | Attending: Interventional Cardiology | Admitting: Interventional Cardiology

## 2023-02-19 DIAGNOSIS — Z951 Presence of aortocoronary bypass graft: Secondary | ICD-10-CM | POA: Diagnosis not present

## 2023-02-21 ENCOUNTER — Encounter (HOSPITAL_COMMUNITY): Payer: Medicare Other

## 2023-02-24 ENCOUNTER — Encounter (HOSPITAL_COMMUNITY)
Admission: RE | Admit: 2023-02-24 | Discharge: 2023-02-24 | Disposition: A | Discharge status: Home / Self Care | Payer: Medicare Other | Source: Ambulatory Visit | Attending: Interventional Cardiology | Admitting: Interventional Cardiology

## 2023-02-24 DIAGNOSIS — Z48812 Encounter for surgical aftercare following surgery on the circulatory system: Secondary | ICD-10-CM | POA: Diagnosis present

## 2023-02-24 DIAGNOSIS — Z951 Presence of aortocoronary bypass graft: Secondary | ICD-10-CM | POA: Insufficient documentation

## 2023-02-26 ENCOUNTER — Encounter (HOSPITAL_COMMUNITY)
Admission: RE | Admit: 2023-02-26 | Discharge: 2023-02-26 | Disposition: A | Discharge status: Home / Self Care | Payer: Medicare Other | Source: Ambulatory Visit | Attending: Interventional Cardiology

## 2023-02-26 DIAGNOSIS — Z48812 Encounter for surgical aftercare following surgery on the circulatory system: Secondary | ICD-10-CM | POA: Diagnosis not present

## 2023-02-26 DIAGNOSIS — Z951 Presence of aortocoronary bypass graft: Secondary | ICD-10-CM

## 2023-02-28 ENCOUNTER — Encounter (HOSPITAL_COMMUNITY)
Admission: RE | Admit: 2023-02-28 | Discharge: 2023-02-28 | Disposition: A | Discharge status: Home / Self Care | Payer: Medicare Other | Source: Ambulatory Visit | Attending: Interventional Cardiology

## 2023-02-28 DIAGNOSIS — Z48812 Encounter for surgical aftercare following surgery on the circulatory system: Secondary | ICD-10-CM | POA: Diagnosis not present

## 2023-02-28 DIAGNOSIS — Z951 Presence of aortocoronary bypass graft: Secondary | ICD-10-CM

## 2023-03-03 ENCOUNTER — Encounter (HOSPITAL_COMMUNITY)
Admission: RE | Admit: 2023-03-03 | Discharge: 2023-03-03 | Disposition: A | Discharge status: Home / Self Care | Payer: Medicare Other | Source: Ambulatory Visit | Attending: Interventional Cardiology

## 2023-03-03 ENCOUNTER — Telehealth (HOSPITAL_COMMUNITY): Payer: Self-pay | Admitting: *Deleted

## 2023-03-03 DIAGNOSIS — Z48812 Encounter for surgical aftercare following surgery on the circulatory system: Secondary | ICD-10-CM | POA: Diagnosis not present

## 2023-03-03 DIAGNOSIS — Z951 Presence of aortocoronary bypass graft: Secondary | ICD-10-CM

## 2023-03-03 NOTE — Progress Notes (Signed)
Patient reported that he fell yesterday while going to the bathroom. Patient reports that he feel on his left arm and his left hip. Erik [name] said that he did not loose consciousness. Denies hitting head. Blood pressure 108/74. Heart rate 64. Oxygen saturation 96% on room air. Patient did say he had a new rug and has a pad for his dog on the floor. Will consult with the onsite provider regarding this episode. Patient has no complaints today. Denies any pain beyond chronic arthritic pain.  Onsite provider Carlos Levering NP spoke with and assessedn  the patient. Okay to proceed with exercise.Will continue to monitor the patient throughout  the program. Thayer Headings RN BSN

## 2023-03-03 NOTE — Telephone Encounter (Signed)
Reviewed medications with wife over the phone.Thayer Headings RN BSN

## 2023-03-05 ENCOUNTER — Encounter (HOSPITAL_COMMUNITY)
Admission: RE | Admit: 2023-03-05 | Discharge: 2023-03-05 | Disposition: A | Discharge status: Home / Self Care | Payer: Medicare Other | Source: Ambulatory Visit | Attending: Interventional Cardiology | Admitting: Interventional Cardiology

## 2023-03-05 DIAGNOSIS — Z48812 Encounter for surgical aftercare following surgery on the circulatory system: Secondary | ICD-10-CM | POA: Diagnosis not present

## 2023-03-06 ENCOUNTER — Telehealth: Payer: Self-pay | Admitting: Nurse Practitioner

## 2023-03-06 ENCOUNTER — Ambulatory Visit (INDEPENDENT_AMBULATORY_CARE_PROVIDER_SITE_OTHER): Payer: Self-pay | Admitting: Cardiothoracic Surgery

## 2023-03-06 ENCOUNTER — Encounter: Payer: Self-pay | Admitting: Cardiothoracic Surgery

## 2023-03-06 VITALS — BP 155/80 | HR 59 | Resp 20 | Wt 217.0 lb

## 2023-03-06 DIAGNOSIS — Z951 Presence of aortocoronary bypass graft: Secondary | ICD-10-CM

## 2023-03-06 NOTE — Telephone Encounter (Signed)
Patient is returning call.  °

## 2023-03-06 NOTE — Progress Notes (Signed)
HPI: Patient presents for final postop visit 3 months after multivessel CABG by Dr. Cliffton Asters.  Patient continues to progress.  He entered the cardiac rehab program in October and is doing well, now on the upright bicycle and walking laps.  His surgical incisions are healing well and he has no surgical pain.  He denies any symptoms of angina or CHF.  The patient was seen in the cardiology office postoperatively and was placed on a Zio patch to assess for persistent postoperative atrial fibrillation-the patient was discharged home on amiodarone and Eliquis.  The rhythm monitor assessment was completed however the patient has not been back to the cardiology office and does not know the results of the rhythm monitor.  He will need to continue the amiodarone and Eliquis until he is seen back by cardiology and counseled about the results of the Zio patch monitor.  We will facilitate getting him an appointment as his main cardiologist, Dr Eldridge Dace has left the [facility] system.  Current Outpatient Medications  Medication Sig Dispense Refill   amiodarone (PACERONE) 200 MG tablet Take 200 mg by mouth daily.     amiodarone (PACERONE) 200 MG tablet Take  200 mg bid for one week;then take 200 mg daily thereafter 90 tablet 2   apixaban (ELIQUIS) 5 MG TABS tablet Take 1 tablet (5 mg total) by mouth 2 (two) times daily. 180 tablet 2   Ascorbic Acid (VITAMIN C) 500 MG CAPS Take 500 mg by mouth every evening. Chewable     aspirin EC 81 MG tablet Take 1 tablet (81 mg total) by mouth daily. Swallow whole.     atorvastatin (LIPITOR) 40 MG tablet Take 1 tablet (40 mg total) by mouth daily. 90 tablet 2   b complex vitamins capsule Take 1 capsule by mouth daily.     Calcium Carb-Cholecalciferol (CALCIUM 600 + D PO) Take 1 tablet by mouth every evening.     Cholecalciferol (VITAMIN D3) 5000 units CAPS Take 5,000 Units by mouth every morning.     colchicine 0.6 MG tablet Take 0.6 mg by mouth 2 (two) times daily as needed (gout).      ferrous sulfate 325 (65 FE) MG tablet Take 325 mg by mouth every evening.     fexofenadine (ALLEGRA) 180 MG tablet Take 180 mg by mouth daily.     Flaxseed, Linseed, (FLAXSEED OIL) 1000 MG CAPS Take 1 capsule by mouth daily.     folic acid (FOLVITE) 800 MCG tablet Take 800 mcg by mouth daily.     furosemide (LASIX) 20 MG tablet Take 1 tablet (20 mg total) by mouth daily as needed. 20 tablet 0   HYDROcodone-acetaminophen (NORCO) 10-325 MG tablet Take 4 tablets by mouth every 4 (four) hours as needed for moderate pain (pain score 4-6) or severe pain (pain score 7-10).     Melatonin 10 MG CAPS Take 10 mg by mouth at bedtime.     methocarbamol (ROBAXIN) 500 MG tablet Take 500 mg by mouth daily as needed for muscle spasms.     metoprolol tartrate (LOPRESSOR) 25 MG tablet TAKE 1 TABLET BY MOUTH TWICE A DAY 180 tablet 3   mirtazapine (REMERON) 30 MG tablet Take 30 mg by mouth at bedtime.     Multiple Vitamin (MULITIVITAMIN WITH MINERALS) TABS Take 1 tablet by mouth daily. Men     OVER THE COUNTER MEDICATION Take 1 tablet by mouth daily. Unforgettable memory supplement     pantoprazole (PROTONIX) 40 MG tablet Take 1 tablet (  40 mg total) by mouth at bedtime.     potassium chloride SA (KLOR-CON M) 20 MEQ tablet Take 1 tablet (20 mEq total) by mouth daily. For 10 days then stop 10 tablet 0   Saw Palmetto 450 MG CAPS Take 1 capsule by mouth daily.     Selenium 200 MCG CAPS Take 200 mcg by mouth every morning.     vitamin B-12 (CYANOCOBALAMIN) 500 MCG tablet Take 500 mcg by mouth daily.     vitamin E 180 MG (400 UNITS) capsule Take 400 Units by mouth daily.     No current facility-administered medications for this visit.     Physical Exam: Vitals:   03/06/23 1055  BP: (!) 155/80  Pulse: (!) 59  Resp: 20  SpO2: 97%       Exam    General- alert and comfortable.  Sternal incision well-healed and comfortable.    Neck- no JVD, no cervical adenopathy palpable, no carotid bruit   Lungs- clear  without rales, wheezes   Cor- regular rate and rhythm, no murmur , gallop   Abdomen- soft, non-tender   Extremities - warm, non-tender, minimal edema   Neuro- oriented, appropriate, no focal weakness    Diagnostic Tests: None  Impression: Patient now doing well 3 months after multivessel CABG. His sternal restrictions are now lifted and he can proceed with doing yard work and his normal activities as tolerated.  He is encouraged to complete his phase 2 cardiac rehab and to follow a heart healthy lifestyle going forward including compliance with his medications, heart healthy diet, and 30 to 40 minutes of aerobic activity 5 days a week.  Plan: The patient will be set up to be seen by cardiology for his post CABG issues including the atrial fibrillation is noted above.  He will return here as needed.    Lovett Sox, MD [facility] [phone]

## 2023-03-06 NOTE — Telephone Encounter (Signed)
Please let patient know that his event monitor showed predominantly sinus rhythm with 1 run of fast beats occurring in the bottom portion of the heart and multiple extra beats occurring in the top portion of the heart called PAC (premature atrial contractions).  We can discontinue Amiodarone and continue Metoprolol 25 mg BID. Please let me know if you have any further questions.   Robin Searing, NP

## 2023-03-06 NOTE — Telephone Encounter (Signed)
Spoke with patient and wife.  They received monitor results and verbalized understanding.  They would like to know if he needs to continue eliquis?

## 2023-03-06 NOTE — Telephone Encounter (Signed)
Attempted phone call to pt and left voicemail message to contact triage at [phone]. 

## 2023-03-07 ENCOUNTER — Encounter (HOSPITAL_COMMUNITY)
Admission: RE | Admit: 2023-03-07 | Discharge: 2023-03-07 | Disposition: A | Discharge status: Home / Self Care | Payer: Medicare Other | Source: Ambulatory Visit | Attending: Interventional Cardiology

## 2023-03-07 DIAGNOSIS — Z951 Presence of aortocoronary bypass graft: Secondary | ICD-10-CM

## 2023-03-07 DIAGNOSIS — Z48812 Encounter for surgical aftercare following surgery on the circulatory system: Secondary | ICD-10-CM | POA: Diagnosis not present

## 2023-03-10 ENCOUNTER — Encounter (HOSPITAL_COMMUNITY)
Admission: RE | Admit: 2023-03-10 | Discharge: 2023-03-10 | Disposition: A | Discharge status: Home / Self Care | Payer: Medicare Other | Source: Ambulatory Visit | Attending: Interventional Cardiology | Admitting: Interventional Cardiology

## 2023-03-10 DIAGNOSIS — Z951 Presence of aortocoronary bypass graft: Secondary | ICD-10-CM

## 2023-03-10 DIAGNOSIS — Z48812 Encounter for surgical aftercare following surgery on the circulatory system: Secondary | ICD-10-CM | POA: Diagnosis not present

## 2023-03-10 NOTE — Progress Notes (Signed)
CARDIAC REHAB PHASE 2  Reviewed home exercise with pt today. Pt is tolerating exercise well. Pt will continue to exercise on his own by doing a virtual class on [employer] for 30-45 minutes per session 1-2 days a week in addition to the 3 days in CRP2. Advised pt on THRR, RPE scale, hydration and temperature/humidity precautions. Reinforced NTG use, S/S to stop exercise and when to call MD vs 911. Encouraged warm up cool down and stretches with exercise sessions. Pt verbalized understanding, all questions were answered and pt was given a copy to take home.    Harrie Jeans ACSM-CEP 03/10/2023 5:07 PM

## 2023-03-11 NOTE — Telephone Encounter (Signed)
Spoke wife and she is aware patient needs to continue eliquis.

## 2023-03-12 ENCOUNTER — Encounter (HOSPITAL_COMMUNITY)
Admission: RE | Admit: 2023-03-12 | Discharge: 2023-03-12 | Disposition: A | Discharge status: Home / Self Care | Payer: Medicare Other | Source: Ambulatory Visit | Attending: Interventional Cardiology

## 2023-03-12 DIAGNOSIS — Z48812 Encounter for surgical aftercare following surgery on the circulatory system: Secondary | ICD-10-CM | POA: Diagnosis not present

## 2023-03-12 DIAGNOSIS — Z951 Presence of aortocoronary bypass graft: Secondary | ICD-10-CM

## 2023-03-14 ENCOUNTER — Encounter (HOSPITAL_COMMUNITY)
Admission: RE | Admit: 2023-03-14 | Discharge: 2023-03-14 | Disposition: A | Discharge status: Home / Self Care | Payer: Medicare Other | Source: Ambulatory Visit | Attending: Interventional Cardiology | Admitting: Interventional Cardiology

## 2023-03-14 VITALS — Ht 68.5 in | Wt 222.0 lb

## 2023-03-14 DIAGNOSIS — Z48812 Encounter for surgical aftercare following surgery on the circulatory system: Secondary | ICD-10-CM | POA: Diagnosis not present

## 2023-03-14 DIAGNOSIS — Z951 Presence of aortocoronary bypass graft: Secondary | ICD-10-CM

## 2023-03-17 ENCOUNTER — Encounter (HOSPITAL_COMMUNITY)
Admission: RE | Admit: 2023-03-17 | Discharge: 2023-03-17 | Disposition: A | Discharge status: Home / Self Care | Payer: Medicare Other | Source: Ambulatory Visit | Attending: Interventional Cardiology

## 2023-03-17 DIAGNOSIS — Z951 Presence of aortocoronary bypass graft: Secondary | ICD-10-CM

## 2023-03-17 DIAGNOSIS — Z48812 Encounter for surgical aftercare following surgery on the circulatory system: Secondary | ICD-10-CM | POA: Diagnosis not present

## 2023-03-21 ENCOUNTER — Encounter (HOSPITAL_COMMUNITY)
Admission: RE | Admit: 2023-03-21 | Discharge: 2023-03-21 | Disposition: A | Discharge status: Home / Self Care | Payer: Medicare Other | Source: Ambulatory Visit | Attending: Interventional Cardiology

## 2023-03-21 DIAGNOSIS — Z951 Presence of aortocoronary bypass graft: Secondary | ICD-10-CM

## 2023-03-21 DIAGNOSIS — Z48812 Encounter for surgical aftercare following surgery on the circulatory system: Secondary | ICD-10-CM | POA: Diagnosis not present

## 2023-03-24 ENCOUNTER — Encounter (HOSPITAL_COMMUNITY)
Admission: RE | Admit: 2023-03-24 | Discharge: 2023-03-24 | Disposition: A | Discharge status: Home / Self Care | Payer: Medicare Other | Source: Ambulatory Visit | Attending: Interventional Cardiology

## 2023-03-24 DIAGNOSIS — Z48812 Encounter for surgical aftercare following surgery on the circulatory system: Secondary | ICD-10-CM | POA: Diagnosis not present

## 2023-03-24 DIAGNOSIS — Z951 Presence of aortocoronary bypass graft: Secondary | ICD-10-CM

## 2023-03-24 NOTE — Progress Notes (Incomplete)
Discharge Progress Report  Patient Details  Name: Mantej Tritz MRN: [id number] Date of Birth: 06/02/1945 Referring Provider:   Flowsheet Row INTENSIVE CARDIAC REHAB ORIENT from 01/21/2023 in [facility]  Referring Provider Lance Muss, MD        Number of Visits: ***  Reason for Discharge:  Patient reached a stable level of exercise. Patient independent in their exercise. Patient has met program and personal goals.  Smoking History:  Social History   Tobacco Use  Smoking Status Never  Smokeless Tobacco Never    Diagnosis:  12/11/22 S/P CABG x 4  ADL UCSD:   Initial Exercise Prescription:  Initial Exercise Prescription - 01/21/23 1500       Date of Initial Exercise RX and Referring Provider   Date 01/21/23    Referring Provider Lance Muss, MD    Expected Discharge Date 04/16/23      Prescription Details   Frequency (times per week) 3    Duration Progress to 30 minutes of continuous aerobic without signs/symptoms of physical distress      Intensity   THRR 40-80% of Max Heartrate 57-114    Ratings of Perceived Exertion 11-13    Perceived Dyspnea 0-4      Progression   Progression Continue progressive overload as per policy without signs/symptoms or physical distress.      Resistance Training   Training Prescription Yes    Weight 3 lbs    Reps 10-15             Discharge Exercise Prescription (Final Exercise Prescription Changes):  Exercise Prescription Changes - 03/10/23 1653       Response to Exercise   Blood Pressure (Admit) 102/60    Blood Pressure (Exit) 106/62    Heart Rate (Admit) 62 bpm    Heart Rate (Exercise) 99 bpm    Heart Rate (Exit) 55 bpm    Rating of Perceived Exertion (Exercise) 10.5    Perceived Dyspnea (Exercise) 0    Symptoms 0    Comments Reviewed goals and home ExRx    Duration Progress to 30 minutes of  aerobic without signs/symptoms of  physical distress    Intensity THRR unchanged      Progression   Progression Continue to progress workloads to maintain intensity without signs/symptoms of physical distress.    Average METs 2.58      Resistance Training   Training Prescription Yes    Weight 3 lbs    Reps 10-15    Time 10 Minutes      Recumbant Bike   Level 3    RPM 53    Watts 26    Minutes 15    METs 2.5      Track   Laps 13    Minutes 15    METs 2.66      Home Exercise Plan   Plans to continue exercise at Home (comment)    Frequency Add 2 additional days to program exercise sessions.    Initial Home Exercises Provided 03/10/23             Functional Capacity:  6 Minute Walk     Row Name 01/21/23 1450 03/14/23 1629       6 Minute Walk   Phase Initial Discharge    Distance 932 feet 1265 feet    Distance % Change -- 35.73 %    Distance Feet Change -- 333 ft    Walk  Time 6 minutes 6 minutes    # of Rest Breaks 0 0    MPH 1.8 2.4    METS 1.5 2.74    RPE 13 11    Perceived Dyspnea  0 0    VO2 Peak 2.25 7.07    Symptoms Yes (comment) Yes (comment)    Comments Chronic low back and rt hip pain 5/10 5/10 back pain/stiffness chronic    Resting HR 60 bpm 78 bpm    Resting BP 110/70 118/64    Resting Oxygen Saturation  98 % --    Exercise Oxygen Saturation  during 6 min walk 100 % --    Max Ex. HR 103 bpm 93 bpm    Max Ex. BP 130/78 132/64    2 Minute Post BP 122/70 --             Psychological, QOL, Others - Outcomes: PHQ 2/9:    03/24/2023    3:39 PM 01/21/2023    3:43 PM  Depression screen PHQ 2/9  Decreased Interest 0 0  Down, Depressed, Hopeless 0 0  PHQ - 2 Score 0 0  Altered sleeping 0 1  Tired, decreased energy 0 1  Change in appetite 0 0  Feeling bad or failure about yourself  0 0  Trouble concentrating 0 0  Moving slowly or fidgety/restless 0 0  Suicidal thoughts 0 0  PHQ-9 Score 0 2  Difficult doing work/chores Not difficult at all Not difficult at all     Quality of Life:  Quality of Life - 03/21/23 1639       Quality of Life   Select Quality of Life      Quality of Life Scores   Health/Function Post 23.87 %    Socioeconomic Post 20.57 %    Psych/Spiritual Post 22.86 %    Family Post 24 %    GLOBAL Post 23 %             Personal Goals: Goals established at orientation with interventions provided to work toward goal.  Personal Goals and Risk Factors at Admission - 01/21/23 1546       Core Components/Risk Factors/Patient Goals on Admission    Weight Management Yes;Obesity;Weight Loss    Intervention Weight Management: Develop a combined nutrition and exercise program designed to reach desired caloric intake, while maintaining appropriate intake of nutrient and fiber, sodium and fats, and appropriate energy expenditure required for the weight goal.;Weight Management: Provide education and appropriate resources to help participant work on and attain dietary goals.;Weight Management/Obesity: Establish reasonable short term and long term weight goals.;Obesity: Provide education and appropriate resources to help participant work on and attain dietary goals.    Admit Weight 224 lb 6.9 oz (101.8 kg)    Goal Weight: Long Term 200 lb (90.7 kg)    Expected Outcomes Short Term: Continue to assess and modify interventions until short term weight is achieved;Long Term: Adherence to nutrition and physical activity/exercise program aimed toward attainment of established weight goal;Weight Loss: Understanding of general recommendations for a balanced deficit meal plan, which promotes 1-2 lb weight loss per week and includes a negative energy balance of [phone] kcal/d;Understanding recommendations for meals to include 15-35% energy as protein, 25-35% energy from fat, 35-60% energy from carbohydrates, less than 200mg  of dietary cholesterol, 20-35 gm of total fiber daily;Understanding of distribution of calorie intake throughout the day with the  consumption of 4-5 meals/snacks    Hypertension Yes    Intervention Provide education  on lifestyle modifcations including regular physical activity/exercise, weight management, moderate sodium restriction and increased consumption of fresh fruit, vegetables, and low fat dairy, alcohol moderation, and smoking cessation.;Monitor prescription use compliance.    Expected Outcomes Short Term: Continued assessment and intervention until BP is < 140/52mm HG in hypertensive participants. < 130/58mm HG in hypertensive participants with diabetes, heart failure or chronic kidney disease.;Long Term: Maintenance of blood pressure at goal levels.    Lipids Yes    Intervention Provide education and support for participant on nutrition & aerobic/resistive exercise along with prescribed medications to achieve LDL 70mg , HDL >40mg .    Expected Outcomes Short Term: Participant states understanding of desired cholesterol values and is compliant with medications prescribed. Participant is following exercise prescription and nutrition guidelines.;Long Term: Cholesterol controlled with medications as prescribed, with individualized exercise RX and with personalized nutrition plan. Value goals: LDL < 70mg , HDL > 40 mg.              Personal Goals Discharge:  Goals and Risk Factor Review     Row Name 01/28/23 0807 02/11/23 1653           Core Components/Risk Factors/Patient Goals Review   Personal Goals Review Weight Management/Obesity;Hypertension;Lipids;Stress Weight Management/Obesity;Hypertension;Lipids;Stress      Review Tom started cardiac rehab on 01/28/23. Tom did well with exercise for his fitness level. Vital signs were stable. Tom took rest breaks on the walking track and used the rollator as Elijah Birk is somewhat deconditioned. Tom started cardiac rehab on 01/28/23. Tom did well with exercise for his fitness level. Vital signs were stable. Elijah Birk has lost 3 kg since starting cardiac rehab.      Expected Outcomes  Elijah Birk will continue to participate in cardiac rehab for exercise, nutrition and lifestyle modifications Elijah Birk will continue to participate in cardiac rehab for exercise, nutrition and lifestyle modifications               Exercise Goals and Review:  Exercise Goals     Row Name 01/21/23 1537             Exercise Goals   Increase Physical Activity Yes       Intervention Provide advice, education, support and counseling about physical activity/exercise needs.;Develop an individualized exercise prescription for aerobic and resistive training based on initial evaluation findings, risk stratification, comorbidities and participant's personal goals.       Expected Outcomes Short Term: Attend rehab on a regular basis to increase amount of physical activity.;Long Term: Exercising regularly at least 3-5 days a week.;Long Term: Add in home exercise to make exercise part of routine and to increase amount of physical activity.       Increase Strength and Stamina Yes       Intervention Provide advice, education, support and counseling about physical activity/exercise needs.;Develop an individualized exercise prescription for aerobic and resistive training based on initial evaluation findings, risk stratification, comorbidities and participant's personal goals.       Expected Outcomes Short Term: Increase workloads from initial exercise prescription for resistance, speed, and METs.;Short Term: Perform resistance training exercises routinely during rehab and add in resistance training at home;Long Term: Improve cardiorespiratory fitness, muscular endurance and strength as measured by increased METs and functional capacity ( )       Able to understand and use rate of perceived exertion (RPE) scale Yes       Intervention Provide education and explanation on how to use RPE scale       Expected Outcomes Short  Term: Able to use RPE daily in rehab to express subjective intensity level;Long Term:  Able to use RPE  to guide intensity level when exercising independently       Knowledge and understanding of Target Heart Rate Range (THRR) Yes       Intervention Provide education and explanation of THRR including how the numbers were predicted and where they are located for reference       Expected Outcomes Short Term: Able to state/look up THRR;Long Term: Able to use THRR to govern intensity when exercising independently;Short Term: Able to use daily as guideline for intensity in rehab       Understanding of Exercise Prescription Yes       Intervention Provide education, explanation, and written materials on patient's individual exercise prescription       Expected Outcomes Short Term: Able to explain program exercise prescription;Long Term: Able to explain home exercise prescription to exercise independently                Exercise Goals Re-Evaluation:  Exercise Goals Re-Evaluation     Row Name 01/27/23 1648 02/19/23 1615 03/10/23 1657         Exercise Goal Re-Evaluation   Exercise Goals Review Increase Physical Activity;Understanding of Exercise Prescription;Increase Strength and Stamina;Knowledge and understanding of Target Heart Rate Range (THRR);Able to understand and use rate of perceived exertion (RPE) scale Increase Physical Activity;Understanding of Exercise Prescription;Increase Strength and Stamina;Knowledge and understanding of Target Heart Rate Range (THRR);Able to understand and use rate of perceived exertion (RPE) scale Increase Physical Activity;Understanding of Exercise Prescription;Increase Strength and Stamina;Knowledge and understanding of Target Heart Rate Range (THRR);Able to understand and use rate of perceived exertion (RPE) scale     Comments Pt first day in the [facility] ICR program. Pt tolerated exercise well with an average MET level of 1.98. Pt is learning his THRR, RPE and ExRx. Reviewed MET's and goals. Pt tolerated exercise well with an average MET level of 2.27. Pt is doing  well with exercise and is increasing MET's, he feels good about his goals and is increasing strength, stmaina and it working on a healthier diet. Pt also states he's lsot a little weight. Did talk about home exercise, but right now he does not feel able to add more days in at home. Will evaluate home exercise as stamina improves Reviewed home ExRx and goals. Pt tolerated exercise well with an average MET level of 2.58. Pt is doing well  and is ready to add in exercise on his own at home. He and his wife are going to try virtual classes on [employer]. He's going to try it out this week but is planning on adding 1-2 days for 30-45 mins per session. Pt has talked to his insurance about the upcoming year and his co payments will be a bit too high so he will finish up [facility] ICR 03/24/23 unless otherwise updated.     Expected Outcomes Will continue to monitor pt and progress workloads as tolerated without sign or symptom Will continue to monitor pt and progress workloads as tolerated without sign or symptom Will continue to monitor pt and progress workloads as tolerated without sign or symptom              Nutrition & Weight - Outcomes:  Pre Biometrics - 01/21/23 1400       Pre Biometrics   Waist Circumference 47 inches    Hip Circumference 48.5 inches    Waist to Hip Ratio  0.97 %    Triceps Skinfold 27 mm    % Body Fat 35.8 %    Grip Strength 26 kg    Flexibility --   Not performed, significant back problems   Single Leg Stand 1.5 seconds             Post Biometrics - 03/14/23 1631        Post  Biometrics   Height 5' 8.5" (1.74 m)    Weight 100.7 kg    Waist Circumference 43 inches    Hip Circumference 46.5 inches    Waist to Hip Ratio 0.92 %    BMI (Calculated) 33.26    Triceps Skinfold 24 mm    % Body Fat 33.2 %    Grip Strength 31 kg    Flexibility --   Chronic back issues   Single Leg Stand 5 seconds             Nutrition:  Nutrition Therapy & Goals - 01/28/23  0947       Nutrition Therapy   Diet Heart Healthy Diet    Drug/Food Interactions Statins/Certain Fruits      Personal Nutrition Goals   Nutrition Goal Patient to identify strategies for reducing cardiovascular risk by attending the [facility] education and nutrition series weekly.    Personal Goal #2 Patient to improve diet quality by using the plate method as a guide for meal planning to include lean protein/plant protein, fruits, vegetables, whole grains, nonfat dairy as part of a well-balanced diet.    Personal Goal #3 Patient to reduce sodium intake to 1500mg  per day    Comments Elijah Birk has medical history of HTN, CAD, CABGx4, Hyperlipidemia, PAF, DOE. His LDL remains above goal (81). Patient will benefit from participation in intensive cardiac rehab for nutrition, exercise, and lifestyle modification.      Intervention Plan   Intervention Prescribe, educate and counsel regarding individualized specific dietary modifications aiming towards targeted core components such as weight, hypertension, lipid management, diabetes, heart failure and other comorbidities.;Nutrition handout(s) given to patient.    Expected Outcomes Short Term Goal: Understand basic principles of dietary content, such as calories, fat, sodium, cholesterol and nutrients.;Long Term Goal: Adherence to prescribed nutrition plan.             Nutrition Discharge:  Nutrition Assessments - 03/21/23 1640       Rate Your Plate Scores   Post Score 62             Education Questionnaire Score:  Knowledge Questionnaire Score - 03/21/23 1643       Knowledge Questionnaire Score   Post Score 19/24             Goals reviewed with patient; copy given to patient.Pt graduates from  Intensive/Traditional cardiac rehab program 03/24/23 with completion of    ** exercise and education sessions. Pt maintained good attendance and progressed nicely during their participation in rehab as evidenced by increased MET level.    Medication list reconciled. Repeat  PHQ score-  .  Pt has made significant lifestyle changes and should be commended for their success. Tom achieved their goals during cardiac rehab.   Pt plans to continue exercise at home watching and participating  cable online senior exercise videos 2-3 days a week. Elijah Birk says that participating in cardiac rehab has been helpful for him. Elijah Birk says he feels stronger.Thayer Headings RN BSN

## 2023-03-28 ENCOUNTER — Encounter (HOSPITAL_COMMUNITY): Payer: Medicare Other

## 2023-03-31 ENCOUNTER — Encounter (HOSPITAL_COMMUNITY): Payer: Medicare Other

## 2023-04-02 ENCOUNTER — Encounter (HOSPITAL_COMMUNITY): Payer: Medicare Other

## 2023-04-04 ENCOUNTER — Encounter (HOSPITAL_COMMUNITY): Payer: Medicare Other

## 2023-04-07 ENCOUNTER — Encounter (HOSPITAL_COMMUNITY): Payer: Medicare Other

## 2023-04-09 ENCOUNTER — Encounter (HOSPITAL_COMMUNITY): Payer: Medicare Other

## 2023-04-11 ENCOUNTER — Encounter (HOSPITAL_COMMUNITY): Payer: Medicare Other

## 2023-04-13 NOTE — Progress Notes (Unsigned)
No chief complaint on file.  History of Present Illness: 78 yo male with history of CAD s/p 4V CABG,  GERD, HTN and HLD here today for cardiac follow up. He has been followed in our office by Dr. Eldridge Dace. Cardiac cath in September 2024 with severe three vessel CAD. He underwent 4V CABG on 12/11/22. Post-op atrial fib. Discharged on amiodarone and Eliquis. Echo September 2024 with LVEF=70-75%. Mild to moderate MR. Mild AI. Cardiac monitor November 2024 with    He is here today for follow up. The patient denies any chest pain, dyspnea, palpitations, lower extremity edema, orthopnea, PND, dizziness, near syncope or syncope.   Primary Care Physician: Mila Palmer, MD   Past Medical History:  Diagnosis Date   Arthritis    Coronary artery disease    GERD (gastroesophageal reflux disease)    History of echocardiogram    Echo 4/17 - mild concentric LVH, EF 55-60%, no RWMA, Gr 1 DD, mild AI, mild MR, mild LAE   Hypertension    Kidney stone    Obesity    Tinea corporis     Past Surgical History:  Procedure Laterality Date   CARDIAC CATHETERIZATION N/A 07/13/2015   Procedure: Left Heart Cath and Coronary Angiography;  Surgeon: Corky Crafts, MD;  Location: [facility] INVASIVE CV LAB;  Service: Cardiovascular;  Laterality: N/A;   CATARACT EXTRACTION Bilateral    CORONARY ARTERY BYPASS GRAFT N/A 12/11/2022   Procedure: CORONARY ARTERY BYPASS GRAFTING (CABG)TIMES FOUR UTILIZING LEFT INTERNAL MAMMARY ARTERY AND ENDOSCOPIC VEIN HARVEST RIGHT GREATER SAPHENOUS VEIN;  Surgeon: Corliss Skains, MD;  Location: [facility] OR;  Service: Open Heart Surgery;  Laterality: N/A;   LEFT HEART CATH AND CORONARY ANGIOGRAPHY N/A 11/20/2022   Procedure: LEFT HEART CATH AND CORONARY ANGIOGRAPHY;  Surgeon: Corky Crafts, MD;  Location: [facility] INVASIVE CV LAB;  Service: Cardiovascular;  Laterality: N/A;   TEE WITHOUT CARDIOVERSION N/A 12/11/2022   Procedure: TRANSESOPHAGEAL ECHOCARDIOGRAM;  Surgeon: Corliss Skains, MD;  Location: [facility] OR;  Service: Open Heart Surgery;  Laterality: N/A;   TONSILLECTOMY     removed as a child   TOTAL KNEE ARTHROPLASTY Bilateral     Current Outpatient Medications  Medication Sig Dispense Refill   apixaban (ELIQUIS) 5 MG TABS tablet Take 1 tablet (5 mg total) by mouth 2 (two) times daily. 180 tablet 2   Ascorbic Acid (VITAMIN C) 500 MG CAPS Take 500 mg by mouth every evening. Chewable     aspirin EC 81 MG tablet Take 1 tablet (81 mg total) by mouth daily. Swallow whole.     atorvastatin (LIPITOR) 40 MG tablet Take 1 tablet (40 mg total) by mouth daily. 90 tablet 2   b complex vitamins capsule Take 1 capsule by mouth daily.     Calcium Carb-Cholecalciferol (CALCIUM 600 + D PO) Take 1 tablet by mouth every evening.     Cholecalciferol (VITAMIN D3) 5000 units CAPS Take 5,000 Units by mouth every morning.     colchicine 0.6 MG tablet Take 0.6 mg by mouth 2 (two) times daily as needed (gout). (Patient not taking: Reported on 03/27/2023)     ferrous sulfate 325 (65 FE) MG tablet Take 325 mg by mouth every evening.     fexofenadine (ALLEGRA) 180 MG tablet Take 180 mg by mouth daily.     Flaxseed, Linseed, (FLAXSEED OIL) 1000 MG CAPS Take 1 capsule by mouth daily.     folic acid (FOLVITE) 800 MCG tablet Take 800 mcg  by mouth daily.     furosemide (LASIX) 20 MG tablet Take 1 tablet (20 mg total) by mouth daily as needed. (Patient not taking: Reported on 03/27/2023) 20 tablet 0   HYDROcodone-acetaminophen (NORCO) 10-325 MG tablet Take 4 tablets by mouth every 4 (four) hours as needed for moderate pain (pain score 4-6) or severe pain (pain score 7-10).     Melatonin 10 MG CAPS Take 10 mg by mouth at bedtime.     methocarbamol (ROBAXIN) 500 MG tablet Take 500 mg by mouth daily as needed for muscle spasms.     metoprolol tartrate (LOPRESSOR) 25 MG tablet TAKE 1 TABLET BY MOUTH TWICE A DAY 180 tablet 3   mirtazapine (REMERON) 30 MG tablet Take 30 mg by mouth at bedtime.      Multiple Vitamin (MULITIVITAMIN WITH MINERALS) TABS Take 1 tablet by mouth daily. Men     OVER THE COUNTER MEDICATION Take 1 tablet by mouth daily. Unforgettable memory supplement     pantoprazole (PROTONIX) 40 MG tablet Take 1 tablet (40 mg total) by mouth at bedtime.     Saw Palmetto 450 MG CAPS Take 1 capsule by mouth daily.     Selenium 200 MCG CAPS Take 200 mcg by mouth every morning.     vitamin B-12 (CYANOCOBALAMIN) 500 MCG tablet Take 500 mcg by mouth daily.     vitamin E 180 MG (400 UNITS) capsule Take 400 Units by mouth daily.     No current facility-administered medications for this visit.    No Known Allergies  Social History   Socioeconomic History   Marital status: Married    Spouse name: Not on file   Number of children: 2   Years of education: Not on file   Highest education level: Not on file  Occupational History   Not on file  Tobacco Use   Smoking status: Never   Smokeless tobacco: Never  Vaping Use   Vaping status: Never Used  Substance and Sexual Activity   Alcohol use: Yes    Alcohol/week: 7.0 standard drinks of alcohol    Types: 7 Glasses of wine per week   Drug use: Not Currently   Sexual activity: Not on file  Other Topics Concern   Not on file  Social History Narrative   Not on file   Social Drivers of Health   Financial Resource Strain: Not on file  Food Insecurity: No Food Insecurity (12/15/2022)   Hunger Vital Sign    Worried About Running Out of Food in the Last Year: Never true    Ran Out of Food in the Last Year: Never true  Transportation Needs: No Transportation Needs (12/15/2022)   PRAPARE - Administrator, Civil Service (Medical): No    Lack of Transportation (Non-Medical): No  Physical Activity: Not on file  Stress: Not on file  Social Connections: Not on file  Intimate Partner Violence: Not At Risk (12/15/2022)   Humiliation, Afraid, Rape, and Kick questionnaire    Fear of Current or Ex-Partner: No    Emotionally  Abused: No    Physically Abused: No    Sexually Abused: No    Family History  Problem Relation Age of Onset   Heart disease Mother    Prostate cancer Father    Heart attack Neg Hx    Stroke Neg Hx    Hypertension Neg Hx     Review of Systems:  As stated in the HPI and otherwise negative.  There were no vitals taken for this visit.  Physical Examination: General: Well developed, well nourished, NAD  HEENT: OP clear, mucus membranes moist  SKIN: warm, dry. No rashes. Neuro: No focal deficits  Musculoskeletal: Muscle strength 5/5 all ext  Psychiatric: Mood and affect normal  Neck: No JVD, no carotid bruits, no thyromegaly, no lymphadenopathy.  Lungs:Clear bilaterally, no wheezes, rhonci, crackles Cardiovascular: Regular rate and rhythm. No murmurs, gallops or rubs. Abdomen:Soft. Bowel sounds present. Non-tender.  Extremities: No lower extremity edema. Pulses are 2 + in the bilateral DP/PT.  EKG:  EKG {ACTION; IS/IS [id number]} ordered today. The ekg ordered today demonstrates ***  Recent Labs: 12/12/2022: Magnesium 2.0 01/08/2023: ALT 40; BUN 14; Creatinine, Ser 1.14; Hemoglobin 12.4; Platelets 330; Potassium 4.7; Sodium 142; TSH 1.720   Lipid Panel    Component Value Date/Time   CHOL 174 04/22/2019 1045   TRIG 70 04/22/2019 1045   HDL 87 04/22/2019 1045   CHOLHDL 2.0 04/22/2019 1045   CHOLHDL 3 07/05/2009 0957   VLDL 19.6 07/05/2009 0957   LDLCALC 74 04/22/2019 1045     Wt Readings from Last 3 Encounters:  03/14/23 100.7 kg  03/06/23 98.4 kg  01/21/23 101.8 kg      Assessment and Plan:   1. CAD s/p CABG without angina: No chest pain suggestive of angina.   2. HTN: BP is controlled. No changes  3. HLD: LDL ***.   4. Mitral regurgitation: Mild to moderate by echo in September 2024. Will follow  Labs/ tests ordered today include:  No orders of the defined types were placed in this encounter.    Disposition:   F/U with me in ***     Signed, Verne Carrow, MD, [facility] 04/13/2023 12:50 PM    [facility] [address] [place], [place], [place]  [address] Phone: [phone]; Fax: [phone]

## 2023-04-14 ENCOUNTER — Encounter (HOSPITAL_COMMUNITY): Payer: Medicare Other

## 2023-04-14 ENCOUNTER — Ambulatory Visit: Payer: Medicare Other | Attending: Cardiovascular Disease | Admitting: Cardiovascular Disease

## 2023-04-14 ENCOUNTER — Encounter: Payer: Self-pay | Admitting: Cardiovascular Disease

## 2023-04-14 VITALS — BP 116/70 | HR 60 | Ht 68.5 in | Wt 218.6 lb

## 2023-04-14 DIAGNOSIS — E782 Mixed hyperlipidemia: Secondary | ICD-10-CM | POA: Diagnosis not present

## 2023-04-14 DIAGNOSIS — I480 Paroxysmal atrial fibrillation: Secondary | ICD-10-CM

## 2023-04-14 DIAGNOSIS — I340 Nonrheumatic mitral (valve) insufficiency: Secondary | ICD-10-CM

## 2023-04-14 DIAGNOSIS — I10 Essential (primary) hypertension: Secondary | ICD-10-CM

## 2023-04-14 DIAGNOSIS — I2510 Atherosclerotic heart disease of native coronary artery without angina pectoris: Secondary | ICD-10-CM

## 2023-04-14 NOTE — Patient Instructions (Signed)

## 2023-04-16 ENCOUNTER — Encounter (HOSPITAL_COMMUNITY): Payer: Medicare Other

## 2023-04-18 ENCOUNTER — Encounter (HOSPITAL_COMMUNITY): Payer: Medicare Other

## 2023-04-22 ENCOUNTER — Telehealth: Payer: Self-pay | Admitting: Cardiovascular Disease

## 2023-04-22 NOTE — Telephone Encounter (Signed)
Left voicemail to return call to office

## 2023-04-22 NOTE — Telephone Encounter (Signed)
Pt c/o medication issue:  1. Name of Medication:   apixaban (ELIQUIS) 5 MG TABS tablet   2. How are you currently taking this medication (dosage and times per day)?   As prescribed  3. Are you having a reaction (difficulty breathing--STAT)?   4. What is your medication issue?   Wife Adela Lank) stated this medication is too expensive and they want to get alternate medication or next steps.

## 2023-04-22 NOTE — Telephone Encounter (Signed)
Spoke with wife per DPR and she states patient walked out of pharmacy and stated he will not take Eliquis. It cost $580 a month. Did inform patient they can call to make a payment plan . She stated they are not setting up a payment they will not pay the deductible. They do not plan to meet the deductible. His other medications does not cost that much.  He will need an alternate medication

## 2023-04-22 NOTE — Telephone Encounter (Signed)
Pt's wife is returning call and is requesting call back.

## 2023-04-23 NOTE — Telephone Encounter (Signed)
Left message to return call to office.

## 2023-04-23 NOTE — Telephone Encounter (Signed)
Patient returned RN's call regarding medication.

## 2023-04-23 NOTE — Telephone Encounter (Signed)
Spoke with wife and patient and they do not want to pay for any medication. She states she does not know why he has to take anything. She said she will give [place] a call back after she call her insurance

## 2023-04-23 NOTE — Telephone Encounter (Signed)
His other medications are generic so that is why they do not cost as much. He will need to pay off his deductible regardless. Xarelto will be similar price. Other option would be warfarin

## 2023-04-28 LAB — LAB REPORT - SCANNED
A1c: 5.7
EGFR: 75

## 2023-05-29 ENCOUNTER — Other Ambulatory Visit: Payer: Self-pay | Admitting: Physician Assistant

## 2023-06-03 ENCOUNTER — Other Ambulatory Visit: Payer: Self-pay | Admitting: Physician Assistant

## 2023-06-04 ENCOUNTER — Other Ambulatory Visit: Payer: Self-pay

## 2023-06-04 MED ORDER — METOPROLOL TARTRATE 25 MG PO TABS: 25.0000 mg | ORAL_TABLET | Freq: Two times a day (BID) | ORAL | 3 refills | Status: AC

## 2023-07-15 ENCOUNTER — Encounter: Payer: Self-pay | Admitting: Diagnostic Neuroimaging

## 2023-07-15 ENCOUNTER — Ambulatory Visit: Admitting: Diagnostic Neuroimaging

## 2023-07-15 VITALS — BP 128/76 | HR 52 | Ht 69.0 in | Wt 227.4 lb

## 2023-07-15 DIAGNOSIS — R413 Other amnesia: Secondary | ICD-10-CM | POA: Diagnosis not present

## 2023-07-15 NOTE — Progress Notes (Signed)
 [facility]  PATIENT: Erik [name] DOB: 1945-06-10  REFERRING CLINICIAN: Olin Bertin, MD HISTORY FROM: patient  REASON FOR VISIT: new consult   HISTORICAL  CHIEF COMPLAINT:  Chief Complaint  Patient presents with   New Patient (Initial Visit)    Rm 7, Pt w/spouse. Referral from PCP for memory issues. Pt reports on 4/7 PCP did memory tests in PCP office and was upset by the outcome.    HISTORY OF PRESENT ILLNESS:   78 year old male here for evaluation of memory problems.  Patient does not feel that much of memory issues.  Wife has noticed some progressive short-term memory problems, repeating himself, difficulty recalling recent events and names of some familiar people over the last 6 to 12 months.  No major changes in ADLs.  MMSE testing at PCP was 24 out of 30.   REVIEW OF SYSTEMS: Full 14 system review of systems performed and negative with exception of: as per HPI.  ALLERGIES: No Known Allergies  HOME MEDICATIONS: Outpatient Medications Prior to Visit  Medication Sig Dispense Refill   Ascorbic Acid (VITAMIN C ) 500 MG CAPS Take 500 mg by mouth every evening. Chewable     aspirin  EC 81 MG tablet Take 1 tablet (81 mg total) by mouth daily. Swallow whole.     atorvastatin  (LIPITOR) 40 MG tablet Take 1 tablet (40 mg total) by mouth daily. 90 tablet 2   b complex vitamins capsule Take 1 capsule by mouth daily.     Calcium  Carb-Cholecalciferol  (CALCIUM  600 + D PO) Take 1 tablet by mouth every evening.     Cholecalciferol  (VITAMIN D3) 5000 units CAPS Take 5,000 Units by mouth every morning.     colchicine 0.6 MG tablet Take 0.6 mg by mouth 2 (two) times daily as needed (gout).     ferrous sulfate  325 (65 FE) MG tablet Take 325 mg by mouth every evening.     fexofenadine (ALLEGRA) 180 MG tablet Take 180 mg by mouth daily.     Flaxseed, Linseed, (FLAXSEED OIL) 1000 MG CAPS Take 1 capsule by mouth daily.     folic acid  (FOLVITE ) 800 MCG tablet Take  800 mcg by mouth daily.     HYDROcodone -acetaminophen  (NORCO) 10-325 MG tablet Take 1 tablet by mouth every 4 (four) hours as needed for moderate pain (pain score 4-6) or severe pain (pain score 7-10).     Melatonin 10 MG CAPS Take 10 mg by mouth at bedtime.     methocarbamol (ROBAXIN) 500 MG tablet Take 500 mg by mouth daily as needed for muscle spasms.     metoprolol  tartrate (LOPRESSOR ) 25 MG tablet Take 1 tablet (25 mg total) by mouth 2 (two) times daily. 180 tablet 3   mirtazapine  (REMERON ) 30 MG tablet Take 30 mg by mouth at bedtime.     Multiple Vitamin (MULITIVITAMIN WITH MINERALS) TABS Take 1 tablet by mouth daily. Men     OVER THE COUNTER MEDICATION Take 1 tablet by mouth daily. Unforgettable memory supplement     pantoprazole  (PROTONIX ) 40 MG tablet Take 1 tablet (40 mg total) by mouth at bedtime.     Saw Palmetto 450 MG CAPS Take 1 capsule by mouth daily.     Selenium 200 MCG CAPS Take 200 mcg by mouth every morning.     vitamin B-12 (CYANOCOBALAMIN ) 500 MCG tablet Take 500 mcg by mouth daily.     vitamin E 180 MG (400 UNITS) capsule Take 400 Units by mouth daily.  furosemide  (LASIX ) 20 MG tablet Take 1 tablet (20 mg total) by mouth daily as needed. 20 tablet 0   apixaban  (ELIQUIS ) 5 MG TABS tablet Take 1 tablet (5 mg total) by mouth 2 (two) times daily. (Patient not taking: Reported on 07/15/2023) 180 tablet 2   No facility-administered medications prior to visit.    PAST MEDICAL HISTORY: Past Medical History:  Diagnosis Date   Arthritis    Coronary artery disease    GERD (gastroesophageal reflux disease)    History of echocardiogram    Echo 4/17 - mild concentric LVH, EF 55-60%, no RWMA, Gr 1 DD, mild AI, mild MR, mild LAE   Hypertension    Kidney stone    Obesity    Tinea corporis     PAST SURGICAL HISTORY: Past Surgical History:  Procedure Laterality Date   CARDIAC CATHETERIZATION N/A 07/13/2015   Procedure: Left Heart Cath and Coronary Angiography;  Surgeon:  Lucendia Rusk, MD;  Location: [facility] INVASIVE CV LAB;  Service: Cardiovascular;  Laterality: N/A;   CATARACT EXTRACTION Bilateral    CORONARY ARTERY BYPASS GRAFT N/A 12/11/2022   Procedure: CORONARY ARTERY BYPASS GRAFTING (CABG)TIMES FOUR UTILIZING LEFT INTERNAL MAMMARY ARTERY AND ENDOSCOPIC VEIN HARVEST RIGHT GREATER SAPHENOUS VEIN;  Surgeon: Hilarie Lovely, MD;  Location: [facility] OR;  Service: Open Heart Surgery;  Laterality: N/A;   LEFT HEART CATH AND CORONARY ANGIOGRAPHY N/A 11/20/2022   Procedure: LEFT HEART CATH AND CORONARY ANGIOGRAPHY;  Surgeon: Lucendia Rusk, MD;  Location: [facility] INVASIVE CV LAB;  Service: Cardiovascular;  Laterality: N/A;   TEE WITHOUT CARDIOVERSION N/A 12/11/2022   Procedure: TRANSESOPHAGEAL ECHOCARDIOGRAM;  Surgeon: Hilarie Lovely, MD;  Location: [facility] OR;  Service: Open Heart Surgery;  Laterality: N/A;   TONSILLECTOMY     removed as a child   TOTAL KNEE ARTHROPLASTY Bilateral     FAMILY HISTORY: Family History  Problem Relation Age of Onset   Heart disease Mother    Prostate cancer Father    Heart attack Neg Hx    Stroke Neg Hx    Hypertension Neg Hx     SOCIAL HISTORY: Social History   Socioeconomic History   Marital status: Married    Spouse name: Not on file   Number of children: 2   Years of education: Not on file   Highest education level: Not on file  Occupational History   Not on file  Tobacco Use   Smoking status: Never   Smokeless tobacco: Never  Vaping Use   Vaping status: Never Used  Substance and Sexual Activity   Alcohol  use: Yes    Alcohol /week: 7.0 standard drinks of alcohol     Types: 7 Glasses of wine per week   Drug use: Not Currently   Sexual activity: Not on file  Other Topics Concern   Not on file  Social History Narrative   Not on file   Social Drivers of Health   Financial Resource Strain: Not on file  Food Insecurity: No Food Insecurity (12/15/2022)   Hunger Vital Sign    Worried About Running Out of  Food in the Last Year: Never true    Ran Out of Food in the Last Year: Never true  Transportation Needs: No Transportation Needs (12/15/2022)   PRAPARE - Administrator, Civil Service (Medical): No    Lack of Transportation (Non-Medical): No  Physical Activity: Not on file  Stress: Not on file  Social Connections: Not on file  Intimate Partner Violence:  Not At Risk (12/15/2022)   Humiliation, Afraid, Rape, and Kick questionnaire    Fear of Current or Ex-Partner: No    Emotionally Abused: No    Physically Abused: No    Sexually Abused: No     PHYSICAL EXAM  GENERAL EXAM/CONSTITUTIONAL: Vitals:  Vitals:   07/15/23 1454  BP: 128/76  Pulse: (!) 52  Weight: 227 lb 6.4 oz (103.1 kg)  Height: 5\' 9"  (1.753 m)   Body mass index is 33.58 kg/m. Wt Readings from Last 3 Encounters:  07/15/23 227 lb 6.4 oz (103.1 kg)  04/14/23 218 lb 9.6 oz (99.2 kg)  03/14/23 222 lb 0.1 oz (100.7 kg)   Patient is in no distress; well developed, nourished and groomed; neck is supple  CARDIOVASCULAR: Examination of carotid arteries is normal; no carotid bruits Regular rate and rhythm, no murmurs Examination of peripheral vascular system by observation and palpation is normal  EYES: Ophthalmoscopic exam of optic discs and posterior segments is normal; no papilledema or hemorrhages No results found.  MUSCULOSKELETAL: Gait, strength, tone, movements noted in Neurologic exam below  NEUROLOGIC: MENTAL STATUS:      No data to display             No data to display         awake, alert, oriented to person, place recent and remote memory intact normal attention and concentration language fluent, comprehension intact, naming intact fund of knowledge appropriate  CRANIAL NERVE:  2nd - no papilledema on fundoscopic exam 2nd, 3rd, 4th, 6th - pupils equal and reactive to light, visual fields full to confrontation, extraocular muscles intact, no nystagmus 5th - facial sensation  symmetric 7th - facial strength symmetric 8th - hearing intact 9th - palate elevates symmetrically, uvula midline 11th - shoulder shrug symmetric 12th - tongue protrusion midline  MOTOR:  normal bulk and tone, full strength in the BUE, BLE  SENSORY:  normal and symmetric to light touch, temperature, vibration  COORDINATION:  finger-nose-finger, fine finger movements normal  REFLEXES:  deep tendon reflexes TRACE and symmetric  GAIT/STATION:  narrow based gait; ANTALGIC GAIT     DIAGNOSTIC DATA (LABS, IMAGING, TESTING) - I reviewed patient records, labs, notes, testing and imaging myself where available.  Lab Results  Component Value Date   WBC 10.5 01/08/2023   HGB 12.4 (L) 01/08/2023   HCT 38.5 01/08/2023   MCV 103 (H) 01/08/2023   PLT 330 01/08/2023      Component Value Date/Time   NA 142 01/08/2023 1217   K 4.7 01/08/2023 1217   CL 104 01/08/2023 1217   CO2 26 01/08/2023 1217   GLUCOSE 96 01/08/2023 1217   GLUCOSE 133 (H) 12/27/2022 1852   BUN 14 01/08/2023 1217   CREATININE 1.14 01/08/2023 1217   CREATININE 1.10 06/30/2015 1049   CALCIUM  9.5 01/08/2023 1217   PROT 6.2 01/08/2023 1217   ALBUMIN  3.8 01/08/2023 1217   AST 27 01/08/2023 1217   ALT 40 01/08/2023 1217   ALKPHOS 105 01/08/2023 1217   BILITOT 0.5 01/08/2023 1217   GFRNONAA >60 12/27/2022 1852   GFRAA 79 01/13/2020 1522   Lab Results  Component Value Date   CHOL 174 04/22/2019   HDL 87 04/22/2019   LDLCALC 74 04/22/2019   TRIG 70 04/22/2019   CHOLHDL 2.0 04/22/2019   Lab Results  Component Value Date   HGBA1C 4.9 12/09/2022   No results found for: "VITAMINB12" Lab Results  Component Value Date   TSH 1.720 01/08/2023  Component Ref Range & Units (hover) 2 mo ago  A1c 5.7%      ASSESSMENT AND PLAN  78 y.o. year old male here with:  Dx:  1. Memory loss     PLAN:  MILD MEMORY LOSS (MMSE 24/30 at PCP; no changes in ADLs) - check MRI brain, B12 - try to stay  active physically and get some exercise (at least 15-30 minutes per day) - eat a nutritious diet with lean protein, plants / vegetables, whole grains; avoid ultra-processed foods - increase social activities, brain stimulation, games, puzzles, hobbies, crafts, arts, music; try new activities; keep it fun! - aim for at least 7-8 hours sleep per night (or more) - avoid smoking and alcohol  - caution with medications, finances, driving - safety / supervision issues reviewed  Orders Placed This Encounter  Procedures   MR BRAIN W WO CONTRAST   Vitamin B12   Return for pending if symptoms worsen or fail to improve, pending test results.    Omega Bible, MD 07/15/2023, 4:03 PM Certified in Neurology, Neurophysiology and Neuroimaging  [facility] [address] [place], [place] [address] [phone]

## 2023-07-15 NOTE — Patient Instructions (Signed)
  MILD MEMORY LOSS (MMSE 24/30 at PCP; no changes in ADLs) - check MRI brain, B12 - try to stay active physically and get some exercise (at least 15-30 minutes per day) - eat a nutritious diet with lean protein, plants / vegetables, whole grains; avoid ultra-processed foods - increase social activities, brain stimulation, games, puzzles, hobbies, crafts, arts, music; try new activities; keep it fun! - aim for at least 7-8 hours sleep per night (or more) - avoid smoking and alcohol  - caution with medications, finances, driving - safety / supervision issues reviewed

## 2023-07-16 LAB — VITAMIN B12: Vitamin B-12: 586 pg/mL (ref 232–1245)

## 2023-07-29 ENCOUNTER — Encounter: Payer: Self-pay | Admitting: Diagnostic Neuroimaging

## 2023-08-21 ENCOUNTER — Ambulatory Visit
Admission: RE | Admit: 2023-08-21 | Discharge: 2023-08-21 | Disposition: A | Discharge status: Home / Self Care | Source: Ambulatory Visit | Attending: Diagnostic Neuroimaging | Admitting: Diagnostic Neuroimaging

## 2023-08-21 DIAGNOSIS — R413 Other amnesia: Secondary | ICD-10-CM

## 2023-08-21 MED ORDER — GADOPICLENOL 0.5 MMOL/ML IV SOLN
10.0000 mL | Freq: Once | INTRAVENOUS | Status: AC | PRN
  Administered 2023-08-21: 10 mL via INTRAVENOUS

## 2023-08-27 ENCOUNTER — Ambulatory Visit: Payer: Self-pay | Admitting: Diagnostic Neuroimaging

## 2023-11-03 ENCOUNTER — Ambulatory Visit: Admitting: Diagnostic Neuroimaging

## 2023-11-11 ENCOUNTER — Other Ambulatory Visit: Payer: Self-pay | Admitting: Physician Assistant

## 2023-11-20 ENCOUNTER — Other Ambulatory Visit: Payer: Self-pay | Admitting: Interventional Cardiology

## 2023-11-21 NOTE — Telephone Encounter (Signed)
 For review-Last filled by [place], ok to fill??

## 2023-11-25 ENCOUNTER — Telehealth: Payer: Self-pay | Admitting: Cardiovascular Disease

## 2023-11-25 MED ORDER — ATORVASTATIN CALCIUM 40 MG PO TABS: 40.0000 mg | ORAL_TABLET | Freq: Every day | ORAL | 1 refills | Status: AC

## 2023-11-25 NOTE — Telephone Encounter (Signed)
*  STAT* If patient is at the pharmacy, call can be transferred to refill team.   1. Which medications need to be refilled? (please list name of each medication and dose if known) atorvastatin  (LIPITOR) 40 MG tablet    2. Would you like to learn more about the convenience, safety, & potential cost savings by using the [facility]?    3. Are you open to using the [facility] (Type [facility].  ).   4. Which pharmacy/location (including street and city if local pharmacy) is medication to be sent to?  [facility] [id number] [place], [place] - [address]     5. Do they need a 30 day or 90 day supply? 90 day

## 2023-11-25 NOTE — Telephone Encounter (Signed)
 RX sent in

## 2024-04-01 ENCOUNTER — Encounter: Payer: Self-pay | Admitting: Cardiovascular Disease

## 2024-07-09 ENCOUNTER — Ambulatory Visit: Admitting: Cardiovascular Disease
